# Patient Record
Sex: Female | Born: 1935 | Race: White | Hispanic: No
Health system: Southern US, Community
[De-identification: ages and names within clinical notes are randomized; demographics above are authoritative.]

## PROBLEM LIST (undated history)

## (undated) HISTORY — PX: CHOLECYSTECTOMY: SHX55

---

## 2014-05-07 DIAGNOSIS — H26492 Other secondary cataract, left eye: Secondary | ICD-10-CM | POA: Diagnosis not present

## 2014-05-16 DIAGNOSIS — R928 Other abnormal and inconclusive findings on diagnostic imaging of breast: Secondary | ICD-10-CM | POA: Diagnosis not present

## 2014-05-16 DIAGNOSIS — M19012 Primary osteoarthritis, left shoulder: Secondary | ICD-10-CM | POA: Diagnosis not present

## 2014-05-16 DIAGNOSIS — M25512 Pain in left shoulder: Secondary | ICD-10-CM | POA: Diagnosis not present

## 2014-05-21 DIAGNOSIS — H04123 Dry eye syndrome of bilateral lacrimal glands: Secondary | ICD-10-CM | POA: Diagnosis not present

## 2014-05-22 DIAGNOSIS — N6012 Diffuse cystic mastopathy of left breast: Secondary | ICD-10-CM | POA: Diagnosis not present

## 2014-05-22 DIAGNOSIS — R928 Other abnormal and inconclusive findings on diagnostic imaging of breast: Secondary | ICD-10-CM | POA: Diagnosis not present

## 2014-05-22 DIAGNOSIS — N642 Atrophy of breast: Secondary | ICD-10-CM | POA: Diagnosis not present

## 2014-06-20 DIAGNOSIS — M05741 Rheumatoid arthritis with rheumatoid factor of right hand without organ or systems involvement: Secondary | ICD-10-CM | POA: Diagnosis not present

## 2014-07-01 DIAGNOSIS — M05741 Rheumatoid arthritis with rheumatoid factor of right hand without organ or systems involvement: Secondary | ICD-10-CM | POA: Diagnosis not present

## 2014-07-01 DIAGNOSIS — M25512 Pain in left shoulder: Secondary | ICD-10-CM | POA: Diagnosis not present

## 2014-07-01 DIAGNOSIS — I10 Essential (primary) hypertension: Secondary | ICD-10-CM | POA: Diagnosis not present

## 2014-07-02 DIAGNOSIS — I471 Supraventricular tachycardia: Secondary | ICD-10-CM | POA: Diagnosis not present

## 2014-07-16 DIAGNOSIS — M810 Age-related osteoporosis without current pathological fracture: Secondary | ICD-10-CM | POA: Diagnosis not present

## 2014-07-16 DIAGNOSIS — M170 Bilateral primary osteoarthritis of knee: Secondary | ICD-10-CM | POA: Diagnosis not present

## 2014-07-16 DIAGNOSIS — M0589 Other rheumatoid arthritis with rheumatoid factor of multiple sites: Secondary | ICD-10-CM | POA: Diagnosis not present

## 2014-07-24 DIAGNOSIS — Z96651 Presence of right artificial knee joint: Secondary | ICD-10-CM | POA: Diagnosis not present

## 2014-08-05 DIAGNOSIS — M05741 Rheumatoid arthritis with rheumatoid factor of right hand without organ or systems involvement: Secondary | ICD-10-CM | POA: Diagnosis not present

## 2014-08-05 DIAGNOSIS — Z6821 Body mass index (BMI) 21.0-21.9, adult: Secondary | ICD-10-CM | POA: Diagnosis not present

## 2014-08-05 DIAGNOSIS — T889XXA Complication of surgical and medical care, unspecified, initial encounter: Secondary | ICD-10-CM | POA: Diagnosis not present

## 2014-08-13 DIAGNOSIS — R21 Rash and other nonspecific skin eruption: Secondary | ICD-10-CM | POA: Diagnosis not present

## 2014-08-13 DIAGNOSIS — Z6821 Body mass index (BMI) 21.0-21.9, adult: Secondary | ICD-10-CM | POA: Diagnosis not present

## 2014-08-22 DIAGNOSIS — E785 Hyperlipidemia, unspecified: Secondary | ICD-10-CM | POA: Diagnosis not present

## 2014-08-22 DIAGNOSIS — I471 Supraventricular tachycardia: Secondary | ICD-10-CM | POA: Diagnosis not present

## 2014-08-22 DIAGNOSIS — I10 Essential (primary) hypertension: Secondary | ICD-10-CM | POA: Diagnosis not present

## 2014-08-22 DIAGNOSIS — E559 Vitamin D deficiency, unspecified: Secondary | ICD-10-CM | POA: Diagnosis not present

## 2014-08-22 DIAGNOSIS — M05741 Rheumatoid arthritis with rheumatoid factor of right hand without organ or systems involvement: Secondary | ICD-10-CM | POA: Diagnosis not present

## 2014-10-07 DIAGNOSIS — M205X2 Other deformities of toe(s) (acquired), left foot: Secondary | ICD-10-CM | POA: Diagnosis not present

## 2014-10-07 DIAGNOSIS — M2041 Other hammer toe(s) (acquired), right foot: Secondary | ICD-10-CM | POA: Diagnosis not present

## 2014-10-07 DIAGNOSIS — M2011 Hallux valgus (acquired), right foot: Secondary | ICD-10-CM | POA: Diagnosis not present

## 2014-10-07 DIAGNOSIS — M2012 Hallux valgus (acquired), left foot: Secondary | ICD-10-CM | POA: Diagnosis not present

## 2014-10-17 DIAGNOSIS — M05741 Rheumatoid arthritis with rheumatoid factor of right hand without organ or systems involvement: Secondary | ICD-10-CM | POA: Diagnosis not present

## 2014-10-17 DIAGNOSIS — E785 Hyperlipidemia, unspecified: Secondary | ICD-10-CM | POA: Diagnosis not present

## 2014-10-22 DIAGNOSIS — M810 Age-related osteoporosis without current pathological fracture: Secondary | ICD-10-CM | POA: Diagnosis not present

## 2014-10-22 DIAGNOSIS — M170 Bilateral primary osteoarthritis of knee: Secondary | ICD-10-CM | POA: Diagnosis not present

## 2014-10-22 DIAGNOSIS — M0589 Other rheumatoid arthritis with rheumatoid factor of multiple sites: Secondary | ICD-10-CM | POA: Diagnosis not present

## 2014-11-25 DIAGNOSIS — J690 Pneumonitis due to inhalation of food and vomit: Secondary | ICD-10-CM | POA: Diagnosis not present

## 2014-11-25 DIAGNOSIS — E871 Hypo-osmolality and hyponatremia: Secondary | ICD-10-CM | POA: Diagnosis not present

## 2014-11-25 DIAGNOSIS — R509 Fever, unspecified: Secondary | ICD-10-CM | POA: Diagnosis not present

## 2014-11-25 DIAGNOSIS — M1990 Unspecified osteoarthritis, unspecified site: Secondary | ICD-10-CM | POA: Diagnosis not present

## 2014-11-25 DIAGNOSIS — R531 Weakness: Secondary | ICD-10-CM | POA: Diagnosis not present

## 2014-11-25 DIAGNOSIS — R52 Pain, unspecified: Secondary | ICD-10-CM | POA: Diagnosis not present

## 2014-11-25 DIAGNOSIS — I480 Paroxysmal atrial fibrillation: Secondary | ICD-10-CM | POA: Diagnosis not present

## 2014-11-25 DIAGNOSIS — M0609 Rheumatoid arthritis without rheumatoid factor, multiple sites: Secondary | ICD-10-CM | POA: Diagnosis not present

## 2014-11-25 DIAGNOSIS — B9629 Other Escherichia coli [E. coli] as the cause of diseases classified elsewhere: Secondary | ICD-10-CM | POA: Diagnosis not present

## 2014-11-25 DIAGNOSIS — R296 Repeated falls: Secondary | ICD-10-CM | POA: Diagnosis not present

## 2014-11-25 DIAGNOSIS — E860 Dehydration: Secondary | ICD-10-CM | POA: Diagnosis not present

## 2014-11-25 DIAGNOSIS — Z9981 Dependence on supplemental oxygen: Secondary | ICD-10-CM | POA: Diagnosis not present

## 2014-11-25 DIAGNOSIS — R0902 Hypoxemia: Secondary | ICD-10-CM | POA: Diagnosis not present

## 2014-11-25 DIAGNOSIS — I081 Rheumatic disorders of both mitral and tricuspid valves: Secondary | ICD-10-CM | POA: Diagnosis not present

## 2014-11-25 DIAGNOSIS — F0390 Unspecified dementia without behavioral disturbance: Secondary | ICD-10-CM | POA: Diagnosis not present

## 2014-11-25 DIAGNOSIS — D509 Iron deficiency anemia, unspecified: Secondary | ICD-10-CM | POA: Diagnosis not present

## 2014-11-25 DIAGNOSIS — I10 Essential (primary) hypertension: Secondary | ICD-10-CM | POA: Diagnosis not present

## 2014-11-25 DIAGNOSIS — W19XXXA Unspecified fall, initial encounter: Secondary | ICD-10-CM | POA: Diagnosis not present

## 2014-11-25 DIAGNOSIS — Z888 Allergy status to other drugs, medicaments and biological substances status: Secondary | ICD-10-CM | POA: Diagnosis not present

## 2014-11-25 DIAGNOSIS — I4891 Unspecified atrial fibrillation: Secondary | ICD-10-CM | POA: Diagnosis not present

## 2014-11-25 DIAGNOSIS — S79922A Unspecified injury of left thigh, initial encounter: Secondary | ICD-10-CM | POA: Diagnosis not present

## 2014-11-25 DIAGNOSIS — N3001 Acute cystitis with hematuria: Secondary | ICD-10-CM | POA: Diagnosis not present

## 2014-11-25 DIAGNOSIS — M069 Rheumatoid arthritis, unspecified: Secondary | ICD-10-CM | POA: Diagnosis not present

## 2014-11-25 DIAGNOSIS — G92 Toxic encephalopathy: Secondary | ICD-10-CM | POA: Diagnosis not present

## 2014-11-25 DIAGNOSIS — Z79899 Other long term (current) drug therapy: Secondary | ICD-10-CM | POA: Diagnosis not present

## 2014-11-25 DIAGNOSIS — N179 Acute kidney failure, unspecified: Secondary | ICD-10-CM | POA: Diagnosis not present

## 2014-11-25 DIAGNOSIS — Z885 Allergy status to narcotic agent status: Secondary | ICD-10-CM | POA: Diagnosis not present

## 2014-11-25 DIAGNOSIS — N390 Urinary tract infection, site not specified: Secondary | ICD-10-CM | POA: Diagnosis not present

## 2014-11-25 DIAGNOSIS — R5383 Other fatigue: Secondary | ICD-10-CM | POA: Diagnosis not present

## 2014-11-25 DIAGNOSIS — J156 Pneumonia due to other aerobic Gram-negative bacteria: Secondary | ICD-10-CM | POA: Diagnosis not present

## 2014-11-25 DIAGNOSIS — A419 Sepsis, unspecified organism: Secondary | ICD-10-CM | POA: Diagnosis not present

## 2014-11-25 DIAGNOSIS — K219 Gastro-esophageal reflux disease without esophagitis: Secondary | ICD-10-CM | POA: Diagnosis not present

## 2014-11-25 DIAGNOSIS — G02 Meningitis in other infectious and parasitic diseases classified elsewhere: Secondary | ICD-10-CM | POA: Diagnosis not present

## 2014-11-25 DIAGNOSIS — Z7401 Bed confinement status: Secondary | ICD-10-CM | POA: Diagnosis not present

## 2014-11-25 DIAGNOSIS — J9601 Acute respiratory failure with hypoxia: Secondary | ICD-10-CM | POA: Diagnosis not present

## 2014-11-25 DIAGNOSIS — W19XXXD Unspecified fall, subsequent encounter: Secondary | ICD-10-CM | POA: Diagnosis not present

## 2014-11-25 DIAGNOSIS — R000 Tachycardia, unspecified: Secondary | ICD-10-CM | POA: Diagnosis not present

## 2014-12-02 DIAGNOSIS — I480 Paroxysmal atrial fibrillation: Secondary | ICD-10-CM | POA: Diagnosis not present

## 2014-12-02 DIAGNOSIS — N390 Urinary tract infection, site not specified: Secondary | ICD-10-CM | POA: Diagnosis not present

## 2014-12-02 DIAGNOSIS — E860 Dehydration: Secondary | ICD-10-CM | POA: Diagnosis not present

## 2014-12-02 DIAGNOSIS — W19XXXD Unspecified fall, subsequent encounter: Secondary | ICD-10-CM | POA: Diagnosis not present

## 2014-12-02 DIAGNOSIS — W19XXXA Unspecified fall, initial encounter: Secondary | ICD-10-CM | POA: Diagnosis not present

## 2014-12-02 DIAGNOSIS — Z7401 Bed confinement status: Secondary | ICD-10-CM | POA: Diagnosis not present

## 2014-12-02 DIAGNOSIS — F0390 Unspecified dementia without behavioral disturbance: Secondary | ICD-10-CM | POA: Diagnosis not present

## 2014-12-02 DIAGNOSIS — N3001 Acute cystitis with hematuria: Secondary | ICD-10-CM | POA: Diagnosis not present

## 2014-12-02 DIAGNOSIS — B9629 Other Escherichia coli [E. coli] as the cause of diseases classified elsewhere: Secondary | ICD-10-CM | POA: Diagnosis not present

## 2014-12-02 DIAGNOSIS — Z9981 Dependence on supplemental oxygen: Secondary | ICD-10-CM | POA: Diagnosis not present

## 2014-12-03 DIAGNOSIS — I480 Paroxysmal atrial fibrillation: Secondary | ICD-10-CM | POA: Diagnosis not present

## 2014-12-10 DIAGNOSIS — I480 Paroxysmal atrial fibrillation: Secondary | ICD-10-CM | POA: Diagnosis not present

## 2014-12-15 DIAGNOSIS — Z9181 History of falling: Secondary | ICD-10-CM | POA: Diagnosis not present

## 2014-12-15 DIAGNOSIS — I4891 Unspecified atrial fibrillation: Secondary | ICD-10-CM | POA: Diagnosis not present

## 2014-12-15 DIAGNOSIS — I10 Essential (primary) hypertension: Secondary | ICD-10-CM | POA: Diagnosis not present

## 2014-12-15 DIAGNOSIS — R262 Difficulty in walking, not elsewhere classified: Secondary | ICD-10-CM | POA: Diagnosis not present

## 2014-12-15 DIAGNOSIS — F0390 Unspecified dementia without behavioral disturbance: Secondary | ICD-10-CM | POA: Diagnosis not present

## 2014-12-15 DIAGNOSIS — M6281 Muscle weakness (generalized): Secondary | ICD-10-CM | POA: Diagnosis not present

## 2014-12-16 DIAGNOSIS — R262 Difficulty in walking, not elsewhere classified: Secondary | ICD-10-CM | POA: Diagnosis not present

## 2014-12-16 DIAGNOSIS — I10 Essential (primary) hypertension: Secondary | ICD-10-CM | POA: Diagnosis not present

## 2014-12-16 DIAGNOSIS — M6281 Muscle weakness (generalized): Secondary | ICD-10-CM | POA: Diagnosis not present

## 2014-12-16 DIAGNOSIS — F0390 Unspecified dementia without behavioral disturbance: Secondary | ICD-10-CM | POA: Diagnosis not present

## 2014-12-16 DIAGNOSIS — I4891 Unspecified atrial fibrillation: Secondary | ICD-10-CM | POA: Diagnosis not present

## 2014-12-16 DIAGNOSIS — Z9181 History of falling: Secondary | ICD-10-CM | POA: Diagnosis not present

## 2014-12-17 DIAGNOSIS — R262 Difficulty in walking, not elsewhere classified: Secondary | ICD-10-CM | POA: Diagnosis not present

## 2014-12-17 DIAGNOSIS — I4891 Unspecified atrial fibrillation: Secondary | ICD-10-CM | POA: Diagnosis not present

## 2014-12-17 DIAGNOSIS — M6281 Muscle weakness (generalized): Secondary | ICD-10-CM | POA: Diagnosis not present

## 2014-12-17 DIAGNOSIS — Z9181 History of falling: Secondary | ICD-10-CM | POA: Diagnosis not present

## 2014-12-17 DIAGNOSIS — F0390 Unspecified dementia without behavioral disturbance: Secondary | ICD-10-CM | POA: Diagnosis not present

## 2014-12-17 DIAGNOSIS — I10 Essential (primary) hypertension: Secondary | ICD-10-CM | POA: Diagnosis not present

## 2014-12-19 DIAGNOSIS — I10 Essential (primary) hypertension: Secondary | ICD-10-CM | POA: Diagnosis not present

## 2014-12-19 DIAGNOSIS — I4891 Unspecified atrial fibrillation: Secondary | ICD-10-CM | POA: Diagnosis not present

## 2014-12-19 DIAGNOSIS — R262 Difficulty in walking, not elsewhere classified: Secondary | ICD-10-CM | POA: Diagnosis not present

## 2014-12-19 DIAGNOSIS — F0390 Unspecified dementia without behavioral disturbance: Secondary | ICD-10-CM | POA: Diagnosis not present

## 2014-12-19 DIAGNOSIS — M6281 Muscle weakness (generalized): Secondary | ICD-10-CM | POA: Diagnosis not present

## 2014-12-19 DIAGNOSIS — Z9181 History of falling: Secondary | ICD-10-CM | POA: Diagnosis not present

## 2014-12-23 DIAGNOSIS — F0390 Unspecified dementia without behavioral disturbance: Secondary | ICD-10-CM | POA: Diagnosis not present

## 2014-12-23 DIAGNOSIS — M6281 Muscle weakness (generalized): Secondary | ICD-10-CM | POA: Diagnosis not present

## 2014-12-23 DIAGNOSIS — I10 Essential (primary) hypertension: Secondary | ICD-10-CM | POA: Diagnosis not present

## 2014-12-23 DIAGNOSIS — I4891 Unspecified atrial fibrillation: Secondary | ICD-10-CM | POA: Diagnosis not present

## 2014-12-23 DIAGNOSIS — Z9181 History of falling: Secondary | ICD-10-CM | POA: Diagnosis not present

## 2014-12-23 DIAGNOSIS — R262 Difficulty in walking, not elsewhere classified: Secondary | ICD-10-CM | POA: Diagnosis not present

## 2014-12-25 DIAGNOSIS — Z9181 History of falling: Secondary | ICD-10-CM | POA: Diagnosis not present

## 2014-12-25 DIAGNOSIS — R262 Difficulty in walking, not elsewhere classified: Secondary | ICD-10-CM | POA: Diagnosis not present

## 2014-12-25 DIAGNOSIS — F0390 Unspecified dementia without behavioral disturbance: Secondary | ICD-10-CM | POA: Diagnosis not present

## 2014-12-25 DIAGNOSIS — I4891 Unspecified atrial fibrillation: Secondary | ICD-10-CM | POA: Diagnosis not present

## 2014-12-25 DIAGNOSIS — M6281 Muscle weakness (generalized): Secondary | ICD-10-CM | POA: Diagnosis not present

## 2014-12-25 DIAGNOSIS — I10 Essential (primary) hypertension: Secondary | ICD-10-CM | POA: Diagnosis not present

## 2014-12-26 DIAGNOSIS — I480 Paroxysmal atrial fibrillation: Secondary | ICD-10-CM | POA: Diagnosis not present

## 2014-12-26 DIAGNOSIS — Z9181 History of falling: Secondary | ICD-10-CM | POA: Diagnosis not present

## 2014-12-26 DIAGNOSIS — Z1389 Encounter for screening for other disorder: Secondary | ICD-10-CM | POA: Diagnosis not present

## 2014-12-26 DIAGNOSIS — M05741 Rheumatoid arthritis with rheumatoid factor of right hand without organ or systems involvement: Secondary | ICD-10-CM | POA: Diagnosis not present

## 2014-12-26 DIAGNOSIS — G92 Toxic encephalopathy: Secondary | ICD-10-CM | POA: Diagnosis not present

## 2014-12-26 DIAGNOSIS — Z139 Encounter for screening, unspecified: Secondary | ICD-10-CM | POA: Diagnosis not present

## 2014-12-26 DIAGNOSIS — M6281 Muscle weakness (generalized): Secondary | ICD-10-CM | POA: Diagnosis not present

## 2014-12-26 DIAGNOSIS — I10 Essential (primary) hypertension: Secondary | ICD-10-CM | POA: Diagnosis not present

## 2014-12-26 DIAGNOSIS — R262 Difficulty in walking, not elsewhere classified: Secondary | ICD-10-CM | POA: Diagnosis not present

## 2014-12-26 DIAGNOSIS — F0390 Unspecified dementia without behavioral disturbance: Secondary | ICD-10-CM | POA: Diagnosis not present

## 2014-12-26 DIAGNOSIS — N390 Urinary tract infection, site not specified: Secondary | ICD-10-CM | POA: Diagnosis not present

## 2014-12-26 DIAGNOSIS — I4891 Unspecified atrial fibrillation: Secondary | ICD-10-CM | POA: Diagnosis not present

## 2014-12-27 DIAGNOSIS — Z681 Body mass index (BMI) 19 or less, adult: Secondary | ICD-10-CM | POA: Diagnosis not present

## 2014-12-27 DIAGNOSIS — M05741 Rheumatoid arthritis with rheumatoid factor of right hand without organ or systems involvement: Secondary | ICD-10-CM | POA: Diagnosis not present

## 2014-12-27 DIAGNOSIS — M659 Synovitis and tenosynovitis, unspecified: Secondary | ICD-10-CM | POA: Diagnosis not present

## 2014-12-30 DIAGNOSIS — I10 Essential (primary) hypertension: Secondary | ICD-10-CM | POA: Diagnosis not present

## 2014-12-30 DIAGNOSIS — M05741 Rheumatoid arthritis with rheumatoid factor of right hand without organ or systems involvement: Secondary | ICD-10-CM | POA: Diagnosis not present

## 2015-01-01 DIAGNOSIS — I4891 Unspecified atrial fibrillation: Secondary | ICD-10-CM | POA: Diagnosis not present

## 2015-01-01 DIAGNOSIS — R262 Difficulty in walking, not elsewhere classified: Secondary | ICD-10-CM | POA: Diagnosis not present

## 2015-01-01 DIAGNOSIS — M6281 Muscle weakness (generalized): Secondary | ICD-10-CM | POA: Diagnosis not present

## 2015-01-01 DIAGNOSIS — Z9181 History of falling: Secondary | ICD-10-CM | POA: Diagnosis not present

## 2015-01-01 DIAGNOSIS — I10 Essential (primary) hypertension: Secondary | ICD-10-CM | POA: Diagnosis not present

## 2015-01-01 DIAGNOSIS — F0390 Unspecified dementia without behavioral disturbance: Secondary | ICD-10-CM | POA: Diagnosis not present

## 2015-01-02 DIAGNOSIS — I480 Paroxysmal atrial fibrillation: Secondary | ICD-10-CM | POA: Diagnosis not present

## 2015-01-02 DIAGNOSIS — I10 Essential (primary) hypertension: Secondary | ICD-10-CM | POA: Diagnosis not present

## 2015-01-02 DIAGNOSIS — F0390 Unspecified dementia without behavioral disturbance: Secondary | ICD-10-CM | POA: Diagnosis not present

## 2015-01-02 DIAGNOSIS — I4891 Unspecified atrial fibrillation: Secondary | ICD-10-CM | POA: Diagnosis not present

## 2015-01-02 DIAGNOSIS — M6281 Muscle weakness (generalized): Secondary | ICD-10-CM | POA: Diagnosis not present

## 2015-01-02 DIAGNOSIS — Z9181 History of falling: Secondary | ICD-10-CM | POA: Diagnosis not present

## 2015-01-02 DIAGNOSIS — R262 Difficulty in walking, not elsewhere classified: Secondary | ICD-10-CM | POA: Diagnosis not present

## 2015-01-08 DIAGNOSIS — F0390 Unspecified dementia without behavioral disturbance: Secondary | ICD-10-CM | POA: Diagnosis not present

## 2015-01-08 DIAGNOSIS — Z9181 History of falling: Secondary | ICD-10-CM | POA: Diagnosis not present

## 2015-01-08 DIAGNOSIS — I10 Essential (primary) hypertension: Secondary | ICD-10-CM | POA: Diagnosis not present

## 2015-01-08 DIAGNOSIS — M6281 Muscle weakness (generalized): Secondary | ICD-10-CM | POA: Diagnosis not present

## 2015-01-08 DIAGNOSIS — R262 Difficulty in walking, not elsewhere classified: Secondary | ICD-10-CM | POA: Diagnosis not present

## 2015-01-08 DIAGNOSIS — I4891 Unspecified atrial fibrillation: Secondary | ICD-10-CM | POA: Diagnosis not present

## 2015-01-09 DIAGNOSIS — M6281 Muscle weakness (generalized): Secondary | ICD-10-CM | POA: Diagnosis not present

## 2015-01-09 DIAGNOSIS — Z9181 History of falling: Secondary | ICD-10-CM | POA: Diagnosis not present

## 2015-01-09 DIAGNOSIS — I10 Essential (primary) hypertension: Secondary | ICD-10-CM | POA: Diagnosis not present

## 2015-01-09 DIAGNOSIS — I4891 Unspecified atrial fibrillation: Secondary | ICD-10-CM | POA: Diagnosis not present

## 2015-01-09 DIAGNOSIS — R262 Difficulty in walking, not elsewhere classified: Secondary | ICD-10-CM | POA: Diagnosis not present

## 2015-01-09 DIAGNOSIS — F0390 Unspecified dementia without behavioral disturbance: Secondary | ICD-10-CM | POA: Diagnosis not present

## 2015-01-27 DIAGNOSIS — Z96651 Presence of right artificial knee joint: Secondary | ICD-10-CM | POA: Diagnosis not present

## 2015-01-28 DIAGNOSIS — M810 Age-related osteoporosis without current pathological fracture: Secondary | ICD-10-CM | POA: Diagnosis not present

## 2015-01-28 DIAGNOSIS — M0589 Other rheumatoid arthritis with rheumatoid factor of multiple sites: Secondary | ICD-10-CM | POA: Diagnosis not present

## 2015-01-28 DIAGNOSIS — M170 Bilateral primary osteoarthritis of knee: Secondary | ICD-10-CM | POA: Diagnosis not present

## 2015-01-30 DIAGNOSIS — N390 Urinary tract infection, site not specified: Secondary | ICD-10-CM | POA: Diagnosis not present

## 2015-02-13 DIAGNOSIS — I482 Chronic atrial fibrillation: Secondary | ICD-10-CM | POA: Diagnosis not present

## 2015-02-13 DIAGNOSIS — I4891 Unspecified atrial fibrillation: Secondary | ICD-10-CM | POA: Diagnosis not present

## 2015-02-26 DIAGNOSIS — M05741 Rheumatoid arthritis with rheumatoid factor of right hand without organ or systems involvement: Secondary | ICD-10-CM | POA: Diagnosis not present

## 2015-02-26 DIAGNOSIS — E785 Hyperlipidemia, unspecified: Secondary | ICD-10-CM | POA: Diagnosis not present

## 2015-02-26 DIAGNOSIS — I10 Essential (primary) hypertension: Secondary | ICD-10-CM | POA: Diagnosis not present

## 2015-02-26 DIAGNOSIS — Z23 Encounter for immunization: Secondary | ICD-10-CM | POA: Diagnosis not present

## 2015-02-26 DIAGNOSIS — E559 Vitamin D deficiency, unspecified: Secondary | ICD-10-CM | POA: Diagnosis not present

## 2015-02-26 DIAGNOSIS — I480 Paroxysmal atrial fibrillation: Secondary | ICD-10-CM | POA: Diagnosis not present

## 2015-03-11 DIAGNOSIS — M05741 Rheumatoid arthritis with rheumatoid factor of right hand without organ or systems involvement: Secondary | ICD-10-CM | POA: Diagnosis not present

## 2015-03-11 DIAGNOSIS — M25552 Pain in left hip: Secondary | ICD-10-CM | POA: Diagnosis not present

## 2015-03-11 DIAGNOSIS — I10 Essential (primary) hypertension: Secondary | ICD-10-CM | POA: Diagnosis not present

## 2015-04-15 DIAGNOSIS — Z7952 Long term (current) use of systemic steroids: Secondary | ICD-10-CM | POA: Diagnosis not present

## 2015-04-15 DIAGNOSIS — I480 Paroxysmal atrial fibrillation: Secondary | ICD-10-CM | POA: Diagnosis not present

## 2015-04-15 DIAGNOSIS — F0390 Unspecified dementia without behavioral disturbance: Secondary | ICD-10-CM | POA: Diagnosis not present

## 2015-04-15 DIAGNOSIS — G8918 Other acute postprocedural pain: Secondary | ICD-10-CM | POA: Diagnosis not present

## 2015-04-15 DIAGNOSIS — Z87440 Personal history of urinary (tract) infections: Secondary | ICD-10-CM | POA: Diagnosis not present

## 2015-04-15 DIAGNOSIS — K219 Gastro-esophageal reflux disease without esophagitis: Secondary | ICD-10-CM | POA: Diagnosis not present

## 2015-04-15 DIAGNOSIS — I10 Essential (primary) hypertension: Secondary | ICD-10-CM | POA: Diagnosis not present

## 2015-04-15 DIAGNOSIS — S72141D Displaced intertrochanteric fracture of right femur, subsequent encounter for closed fracture with routine healing: Secondary | ICD-10-CM | POA: Diagnosis not present

## 2015-04-15 DIAGNOSIS — Z885 Allergy status to narcotic agent status: Secondary | ICD-10-CM | POA: Diagnosis not present

## 2015-04-15 DIAGNOSIS — N179 Acute kidney failure, unspecified: Secondary | ICD-10-CM | POA: Diagnosis not present

## 2015-04-15 DIAGNOSIS — M1990 Unspecified osteoarthritis, unspecified site: Secondary | ICD-10-CM | POA: Diagnosis not present

## 2015-04-15 DIAGNOSIS — N1330 Unspecified hydronephrosis: Secondary | ICD-10-CM | POA: Diagnosis not present

## 2015-04-15 DIAGNOSIS — S299XXA Unspecified injury of thorax, initial encounter: Secondary | ICD-10-CM | POA: Diagnosis not present

## 2015-04-15 DIAGNOSIS — S72141A Displaced intertrochanteric fracture of right femur, initial encounter for closed fracture: Secondary | ICD-10-CM | POA: Diagnosis not present

## 2015-04-15 DIAGNOSIS — M25551 Pain in right hip: Secondary | ICD-10-CM | POA: Diagnosis not present

## 2015-04-15 DIAGNOSIS — D62 Acute posthemorrhagic anemia: Secondary | ICD-10-CM | POA: Diagnosis not present

## 2015-04-15 DIAGNOSIS — N170 Acute kidney failure with tubular necrosis: Secondary | ICD-10-CM | POA: Diagnosis not present

## 2015-04-15 DIAGNOSIS — D509 Iron deficiency anemia, unspecified: Secondary | ICD-10-CM | POA: Diagnosis not present

## 2015-04-15 DIAGNOSIS — D649 Anemia, unspecified: Secondary | ICD-10-CM | POA: Diagnosis not present

## 2015-04-15 DIAGNOSIS — T148 Other injury of unspecified body region: Secondary | ICD-10-CM | POA: Diagnosis not present

## 2015-04-15 DIAGNOSIS — F419 Anxiety disorder, unspecified: Secondary | ICD-10-CM | POA: Diagnosis not present

## 2015-04-15 DIAGNOSIS — R531 Weakness: Secondary | ICD-10-CM | POA: Diagnosis not present

## 2015-04-15 DIAGNOSIS — Z7401 Bed confinement status: Secondary | ICD-10-CM | POA: Diagnosis not present

## 2015-04-15 DIAGNOSIS — K5790 Diverticulosis of intestine, part unspecified, without perforation or abscess without bleeding: Secondary | ICD-10-CM | POA: Diagnosis not present

## 2015-04-15 DIAGNOSIS — Z888 Allergy status to other drugs, medicaments and biological substances status: Secondary | ICD-10-CM | POA: Diagnosis not present

## 2015-04-15 DIAGNOSIS — M810 Age-related osteoporosis without current pathological fracture: Secondary | ICD-10-CM | POA: Diagnosis not present

## 2015-04-15 DIAGNOSIS — Z791 Long term (current) use of non-steroidal anti-inflammatories (NSAID): Secondary | ICD-10-CM | POA: Diagnosis not present

## 2015-04-15 DIAGNOSIS — D6489 Other specified anemias: Secondary | ICD-10-CM | POA: Diagnosis not present

## 2015-04-15 DIAGNOSIS — M069 Rheumatoid arthritis, unspecified: Secondary | ICD-10-CM | POA: Diagnosis not present

## 2015-04-15 DIAGNOSIS — Z96651 Presence of right artificial knee joint: Secondary | ICD-10-CM | POA: Diagnosis not present

## 2015-04-19 DIAGNOSIS — S72141A Displaced intertrochanteric fracture of right femur, initial encounter for closed fracture: Secondary | ICD-10-CM | POA: Diagnosis not present

## 2015-04-19 DIAGNOSIS — Z7401 Bed confinement status: Secondary | ICD-10-CM | POA: Diagnosis not present

## 2015-04-19 DIAGNOSIS — R531 Weakness: Secondary | ICD-10-CM | POA: Diagnosis not present

## 2015-04-19 DIAGNOSIS — D6489 Other specified anemias: Secondary | ICD-10-CM | POA: Diagnosis not present

## 2015-04-19 DIAGNOSIS — S72141D Displaced intertrochanteric fracture of right femur, subsequent encounter for closed fracture with routine healing: Secondary | ICD-10-CM | POA: Diagnosis not present

## 2015-04-19 DIAGNOSIS — M069 Rheumatoid arthritis, unspecified: Secondary | ICD-10-CM | POA: Diagnosis not present

## 2015-04-19 DIAGNOSIS — I480 Paroxysmal atrial fibrillation: Secondary | ICD-10-CM | POA: Diagnosis not present

## 2015-04-19 DIAGNOSIS — F419 Anxiety disorder, unspecified: Secondary | ICD-10-CM | POA: Diagnosis not present

## 2015-04-19 DIAGNOSIS — G8918 Other acute postprocedural pain: Secondary | ICD-10-CM | POA: Diagnosis not present

## 2015-04-30 DIAGNOSIS — S72141A Displaced intertrochanteric fracture of right femur, initial encounter for closed fracture: Secondary | ICD-10-CM | POA: Diagnosis not present

## 2015-05-09 DIAGNOSIS — I480 Paroxysmal atrial fibrillation: Secondary | ICD-10-CM | POA: Diagnosis not present

## 2015-05-21 DIAGNOSIS — I480 Paroxysmal atrial fibrillation: Secondary | ICD-10-CM | POA: Diagnosis not present

## 2015-05-21 DIAGNOSIS — Z7952 Long term (current) use of systemic steroids: Secondary | ICD-10-CM | POA: Diagnosis not present

## 2015-05-21 DIAGNOSIS — I10 Essential (primary) hypertension: Secondary | ICD-10-CM | POA: Diagnosis not present

## 2015-05-21 DIAGNOSIS — Z96651 Presence of right artificial knee joint: Secondary | ICD-10-CM | POA: Diagnosis not present

## 2015-05-21 DIAGNOSIS — D649 Anemia, unspecified: Secondary | ICD-10-CM | POA: Diagnosis not present

## 2015-05-21 DIAGNOSIS — Z7982 Long term (current) use of aspirin: Secondary | ICD-10-CM | POA: Diagnosis not present

## 2015-05-21 DIAGNOSIS — S72141D Displaced intertrochanteric fracture of right femur, subsequent encounter for closed fracture with routine healing: Secondary | ICD-10-CM | POA: Diagnosis not present

## 2015-05-21 DIAGNOSIS — M1990 Unspecified osteoarthritis, unspecified site: Secondary | ICD-10-CM | POA: Diagnosis not present

## 2015-05-21 DIAGNOSIS — Z79891 Long term (current) use of opiate analgesic: Secondary | ICD-10-CM | POA: Diagnosis not present

## 2015-05-21 DIAGNOSIS — Z602 Problems related to living alone: Secondary | ICD-10-CM | POA: Diagnosis not present

## 2015-05-21 DIAGNOSIS — M069 Rheumatoid arthritis, unspecified: Secondary | ICD-10-CM | POA: Diagnosis not present

## 2015-05-22 DIAGNOSIS — Z79891 Long term (current) use of opiate analgesic: Secondary | ICD-10-CM | POA: Diagnosis not present

## 2015-05-22 DIAGNOSIS — Z602 Problems related to living alone: Secondary | ICD-10-CM | POA: Diagnosis not present

## 2015-05-22 DIAGNOSIS — I480 Paroxysmal atrial fibrillation: Secondary | ICD-10-CM | POA: Diagnosis not present

## 2015-05-22 DIAGNOSIS — M1990 Unspecified osteoarthritis, unspecified site: Secondary | ICD-10-CM | POA: Diagnosis not present

## 2015-05-22 DIAGNOSIS — I10 Essential (primary) hypertension: Secondary | ICD-10-CM | POA: Diagnosis not present

## 2015-05-22 DIAGNOSIS — S72141D Displaced intertrochanteric fracture of right femur, subsequent encounter for closed fracture with routine healing: Secondary | ICD-10-CM | POA: Diagnosis not present

## 2015-05-22 DIAGNOSIS — M069 Rheumatoid arthritis, unspecified: Secondary | ICD-10-CM | POA: Diagnosis not present

## 2015-05-22 DIAGNOSIS — D649 Anemia, unspecified: Secondary | ICD-10-CM | POA: Diagnosis not present

## 2015-05-22 DIAGNOSIS — Z7982 Long term (current) use of aspirin: Secondary | ICD-10-CM | POA: Diagnosis not present

## 2015-05-22 DIAGNOSIS — Z96651 Presence of right artificial knee joint: Secondary | ICD-10-CM | POA: Diagnosis not present

## 2015-05-22 DIAGNOSIS — Z7952 Long term (current) use of systemic steroids: Secondary | ICD-10-CM | POA: Diagnosis not present

## 2015-05-23 DIAGNOSIS — Z602 Problems related to living alone: Secondary | ICD-10-CM | POA: Diagnosis not present

## 2015-05-23 DIAGNOSIS — M069 Rheumatoid arthritis, unspecified: Secondary | ICD-10-CM | POA: Diagnosis not present

## 2015-05-23 DIAGNOSIS — I10 Essential (primary) hypertension: Secondary | ICD-10-CM | POA: Diagnosis not present

## 2015-05-23 DIAGNOSIS — S72141D Displaced intertrochanteric fracture of right femur, subsequent encounter for closed fracture with routine healing: Secondary | ICD-10-CM | POA: Diagnosis not present

## 2015-05-23 DIAGNOSIS — Z79891 Long term (current) use of opiate analgesic: Secondary | ICD-10-CM | POA: Diagnosis not present

## 2015-05-23 DIAGNOSIS — Z7982 Long term (current) use of aspirin: Secondary | ICD-10-CM | POA: Diagnosis not present

## 2015-05-23 DIAGNOSIS — D649 Anemia, unspecified: Secondary | ICD-10-CM | POA: Diagnosis not present

## 2015-05-23 DIAGNOSIS — I480 Paroxysmal atrial fibrillation: Secondary | ICD-10-CM | POA: Diagnosis not present

## 2015-05-23 DIAGNOSIS — M1990 Unspecified osteoarthritis, unspecified site: Secondary | ICD-10-CM | POA: Diagnosis not present

## 2015-05-23 DIAGNOSIS — Z96651 Presence of right artificial knee joint: Secondary | ICD-10-CM | POA: Diagnosis not present

## 2015-05-23 DIAGNOSIS — Z7952 Long term (current) use of systemic steroids: Secondary | ICD-10-CM | POA: Diagnosis not present

## 2015-05-25 DIAGNOSIS — Z7982 Long term (current) use of aspirin: Secondary | ICD-10-CM | POA: Diagnosis not present

## 2015-05-25 DIAGNOSIS — Z96651 Presence of right artificial knee joint: Secondary | ICD-10-CM | POA: Diagnosis not present

## 2015-05-25 DIAGNOSIS — Z602 Problems related to living alone: Secondary | ICD-10-CM | POA: Diagnosis not present

## 2015-05-25 DIAGNOSIS — Z7952 Long term (current) use of systemic steroids: Secondary | ICD-10-CM | POA: Diagnosis not present

## 2015-05-25 DIAGNOSIS — M069 Rheumatoid arthritis, unspecified: Secondary | ICD-10-CM | POA: Diagnosis not present

## 2015-05-25 DIAGNOSIS — D649 Anemia, unspecified: Secondary | ICD-10-CM | POA: Diagnosis not present

## 2015-05-25 DIAGNOSIS — Z79891 Long term (current) use of opiate analgesic: Secondary | ICD-10-CM | POA: Diagnosis not present

## 2015-05-25 DIAGNOSIS — I480 Paroxysmal atrial fibrillation: Secondary | ICD-10-CM | POA: Diagnosis not present

## 2015-05-25 DIAGNOSIS — M1990 Unspecified osteoarthritis, unspecified site: Secondary | ICD-10-CM | POA: Diagnosis not present

## 2015-05-25 DIAGNOSIS — I10 Essential (primary) hypertension: Secondary | ICD-10-CM | POA: Diagnosis not present

## 2015-05-25 DIAGNOSIS — S72141D Displaced intertrochanteric fracture of right femur, subsequent encounter for closed fracture with routine healing: Secondary | ICD-10-CM | POA: Diagnosis not present

## 2015-05-26 DIAGNOSIS — M1990 Unspecified osteoarthritis, unspecified site: Secondary | ICD-10-CM | POA: Diagnosis not present

## 2015-05-26 DIAGNOSIS — S72141D Displaced intertrochanteric fracture of right femur, subsequent encounter for closed fracture with routine healing: Secondary | ICD-10-CM | POA: Diagnosis not present

## 2015-05-26 DIAGNOSIS — Z7982 Long term (current) use of aspirin: Secondary | ICD-10-CM | POA: Diagnosis not present

## 2015-05-26 DIAGNOSIS — Z7952 Long term (current) use of systemic steroids: Secondary | ICD-10-CM | POA: Diagnosis not present

## 2015-05-26 DIAGNOSIS — M069 Rheumatoid arthritis, unspecified: Secondary | ICD-10-CM | POA: Diagnosis not present

## 2015-05-26 DIAGNOSIS — Z79891 Long term (current) use of opiate analgesic: Secondary | ICD-10-CM | POA: Diagnosis not present

## 2015-05-26 DIAGNOSIS — Z96651 Presence of right artificial knee joint: Secondary | ICD-10-CM | POA: Diagnosis not present

## 2015-05-26 DIAGNOSIS — D649 Anemia, unspecified: Secondary | ICD-10-CM | POA: Diagnosis not present

## 2015-05-26 DIAGNOSIS — I480 Paroxysmal atrial fibrillation: Secondary | ICD-10-CM | POA: Diagnosis not present

## 2015-05-26 DIAGNOSIS — I10 Essential (primary) hypertension: Secondary | ICD-10-CM | POA: Diagnosis not present

## 2015-05-26 DIAGNOSIS — Z602 Problems related to living alone: Secondary | ICD-10-CM | POA: Diagnosis not present

## 2015-05-27 DIAGNOSIS — Z7952 Long term (current) use of systemic steroids: Secondary | ICD-10-CM | POA: Diagnosis not present

## 2015-05-27 DIAGNOSIS — I10 Essential (primary) hypertension: Secondary | ICD-10-CM | POA: Diagnosis not present

## 2015-05-27 DIAGNOSIS — Z96651 Presence of right artificial knee joint: Secondary | ICD-10-CM | POA: Diagnosis not present

## 2015-05-27 DIAGNOSIS — Z602 Problems related to living alone: Secondary | ICD-10-CM | POA: Diagnosis not present

## 2015-05-27 DIAGNOSIS — M1990 Unspecified osteoarthritis, unspecified site: Secondary | ICD-10-CM | POA: Diagnosis not present

## 2015-05-27 DIAGNOSIS — I480 Paroxysmal atrial fibrillation: Secondary | ICD-10-CM | POA: Diagnosis not present

## 2015-05-27 DIAGNOSIS — D649 Anemia, unspecified: Secondary | ICD-10-CM | POA: Diagnosis not present

## 2015-05-27 DIAGNOSIS — Z7982 Long term (current) use of aspirin: Secondary | ICD-10-CM | POA: Diagnosis not present

## 2015-05-27 DIAGNOSIS — S72141D Displaced intertrochanteric fracture of right femur, subsequent encounter for closed fracture with routine healing: Secondary | ICD-10-CM | POA: Diagnosis not present

## 2015-05-27 DIAGNOSIS — Z79891 Long term (current) use of opiate analgesic: Secondary | ICD-10-CM | POA: Diagnosis not present

## 2015-05-27 DIAGNOSIS — M069 Rheumatoid arthritis, unspecified: Secondary | ICD-10-CM | POA: Diagnosis not present

## 2015-05-29 DIAGNOSIS — D649 Anemia, unspecified: Secondary | ICD-10-CM | POA: Diagnosis not present

## 2015-05-29 DIAGNOSIS — I10 Essential (primary) hypertension: Secondary | ICD-10-CM | POA: Diagnosis not present

## 2015-05-29 DIAGNOSIS — Z79891 Long term (current) use of opiate analgesic: Secondary | ICD-10-CM | POA: Diagnosis not present

## 2015-05-29 DIAGNOSIS — M069 Rheumatoid arthritis, unspecified: Secondary | ICD-10-CM | POA: Diagnosis not present

## 2015-05-29 DIAGNOSIS — M1990 Unspecified osteoarthritis, unspecified site: Secondary | ICD-10-CM | POA: Diagnosis not present

## 2015-05-29 DIAGNOSIS — S72141D Displaced intertrochanteric fracture of right femur, subsequent encounter for closed fracture with routine healing: Secondary | ICD-10-CM | POA: Diagnosis not present

## 2015-05-29 DIAGNOSIS — Z96651 Presence of right artificial knee joint: Secondary | ICD-10-CM | POA: Diagnosis not present

## 2015-05-29 DIAGNOSIS — Z602 Problems related to living alone: Secondary | ICD-10-CM | POA: Diagnosis not present

## 2015-05-29 DIAGNOSIS — I480 Paroxysmal atrial fibrillation: Secondary | ICD-10-CM | POA: Diagnosis not present

## 2015-05-29 DIAGNOSIS — Z7952 Long term (current) use of systemic steroids: Secondary | ICD-10-CM | POA: Diagnosis not present

## 2015-05-29 DIAGNOSIS — Z7982 Long term (current) use of aspirin: Secondary | ICD-10-CM | POA: Diagnosis not present

## 2015-05-30 DIAGNOSIS — D649 Anemia, unspecified: Secondary | ICD-10-CM | POA: Diagnosis not present

## 2015-05-30 DIAGNOSIS — I10 Essential (primary) hypertension: Secondary | ICD-10-CM | POA: Diagnosis not present

## 2015-05-30 DIAGNOSIS — I480 Paroxysmal atrial fibrillation: Secondary | ICD-10-CM | POA: Diagnosis not present

## 2015-05-30 DIAGNOSIS — Z79891 Long term (current) use of opiate analgesic: Secondary | ICD-10-CM | POA: Diagnosis not present

## 2015-05-30 DIAGNOSIS — S72141D Displaced intertrochanteric fracture of right femur, subsequent encounter for closed fracture with routine healing: Secondary | ICD-10-CM | POA: Diagnosis not present

## 2015-05-30 DIAGNOSIS — Z96651 Presence of right artificial knee joint: Secondary | ICD-10-CM | POA: Diagnosis not present

## 2015-05-30 DIAGNOSIS — M069 Rheumatoid arthritis, unspecified: Secondary | ICD-10-CM | POA: Diagnosis not present

## 2015-05-30 DIAGNOSIS — Z602 Problems related to living alone: Secondary | ICD-10-CM | POA: Diagnosis not present

## 2015-05-30 DIAGNOSIS — M1990 Unspecified osteoarthritis, unspecified site: Secondary | ICD-10-CM | POA: Diagnosis not present

## 2015-05-30 DIAGNOSIS — Z7952 Long term (current) use of systemic steroids: Secondary | ICD-10-CM | POA: Diagnosis not present

## 2015-05-30 DIAGNOSIS — Z7982 Long term (current) use of aspirin: Secondary | ICD-10-CM | POA: Diagnosis not present

## 2015-06-02 DIAGNOSIS — Z7982 Long term (current) use of aspirin: Secondary | ICD-10-CM | POA: Diagnosis not present

## 2015-06-02 DIAGNOSIS — M069 Rheumatoid arthritis, unspecified: Secondary | ICD-10-CM | POA: Diagnosis not present

## 2015-06-02 DIAGNOSIS — M1990 Unspecified osteoarthritis, unspecified site: Secondary | ICD-10-CM | POA: Diagnosis not present

## 2015-06-02 DIAGNOSIS — I10 Essential (primary) hypertension: Secondary | ICD-10-CM | POA: Diagnosis not present

## 2015-06-02 DIAGNOSIS — Z79891 Long term (current) use of opiate analgesic: Secondary | ICD-10-CM | POA: Diagnosis not present

## 2015-06-02 DIAGNOSIS — I480 Paroxysmal atrial fibrillation: Secondary | ICD-10-CM | POA: Diagnosis not present

## 2015-06-02 DIAGNOSIS — Z7952 Long term (current) use of systemic steroids: Secondary | ICD-10-CM | POA: Diagnosis not present

## 2015-06-02 DIAGNOSIS — S72141D Displaced intertrochanteric fracture of right femur, subsequent encounter for closed fracture with routine healing: Secondary | ICD-10-CM | POA: Diagnosis not present

## 2015-06-02 DIAGNOSIS — D649 Anemia, unspecified: Secondary | ICD-10-CM | POA: Diagnosis not present

## 2015-06-02 DIAGNOSIS — Z96651 Presence of right artificial knee joint: Secondary | ICD-10-CM | POA: Diagnosis not present

## 2015-06-02 DIAGNOSIS — Z602 Problems related to living alone: Secondary | ICD-10-CM | POA: Diagnosis not present

## 2015-06-03 DIAGNOSIS — Z7952 Long term (current) use of systemic steroids: Secondary | ICD-10-CM | POA: Diagnosis not present

## 2015-06-03 DIAGNOSIS — D649 Anemia, unspecified: Secondary | ICD-10-CM | POA: Diagnosis not present

## 2015-06-03 DIAGNOSIS — D62 Acute posthemorrhagic anemia: Secondary | ICD-10-CM | POA: Diagnosis not present

## 2015-06-03 DIAGNOSIS — M1990 Unspecified osteoarthritis, unspecified site: Secondary | ICD-10-CM | POA: Diagnosis not present

## 2015-06-03 DIAGNOSIS — Z7982 Long term (current) use of aspirin: Secondary | ICD-10-CM | POA: Diagnosis not present

## 2015-06-03 DIAGNOSIS — Z79891 Long term (current) use of opiate analgesic: Secondary | ICD-10-CM | POA: Diagnosis not present

## 2015-06-03 DIAGNOSIS — Z96651 Presence of right artificial knee joint: Secondary | ICD-10-CM | POA: Diagnosis not present

## 2015-06-03 DIAGNOSIS — I480 Paroxysmal atrial fibrillation: Secondary | ICD-10-CM | POA: Diagnosis not present

## 2015-06-03 DIAGNOSIS — Z6820 Body mass index (BMI) 20.0-20.9, adult: Secondary | ICD-10-CM | POA: Diagnosis not present

## 2015-06-03 DIAGNOSIS — N179 Acute kidney failure, unspecified: Secondary | ICD-10-CM | POA: Diagnosis not present

## 2015-06-03 DIAGNOSIS — Z602 Problems related to living alone: Secondary | ICD-10-CM | POA: Diagnosis not present

## 2015-06-03 DIAGNOSIS — S72141A Displaced intertrochanteric fracture of right femur, initial encounter for closed fracture: Secondary | ICD-10-CM | POA: Diagnosis not present

## 2015-06-03 DIAGNOSIS — I10 Essential (primary) hypertension: Secondary | ICD-10-CM | POA: Diagnosis not present

## 2015-06-03 DIAGNOSIS — M05741 Rheumatoid arthritis with rheumatoid factor of right hand without organ or systems involvement: Secondary | ICD-10-CM | POA: Diagnosis not present

## 2015-06-03 DIAGNOSIS — M069 Rheumatoid arthritis, unspecified: Secondary | ICD-10-CM | POA: Diagnosis not present

## 2015-06-03 DIAGNOSIS — S72141D Displaced intertrochanteric fracture of right femur, subsequent encounter for closed fracture with routine healing: Secondary | ICD-10-CM | POA: Diagnosis not present

## 2015-06-04 DIAGNOSIS — Z96651 Presence of right artificial knee joint: Secondary | ICD-10-CM | POA: Diagnosis not present

## 2015-06-04 DIAGNOSIS — I480 Paroxysmal atrial fibrillation: Secondary | ICD-10-CM | POA: Diagnosis not present

## 2015-06-04 DIAGNOSIS — D649 Anemia, unspecified: Secondary | ICD-10-CM | POA: Diagnosis not present

## 2015-06-04 DIAGNOSIS — Z602 Problems related to living alone: Secondary | ICD-10-CM | POA: Diagnosis not present

## 2015-06-04 DIAGNOSIS — M1990 Unspecified osteoarthritis, unspecified site: Secondary | ICD-10-CM | POA: Diagnosis not present

## 2015-06-04 DIAGNOSIS — S72141D Displaced intertrochanteric fracture of right femur, subsequent encounter for closed fracture with routine healing: Secondary | ICD-10-CM | POA: Diagnosis not present

## 2015-06-04 DIAGNOSIS — Z7982 Long term (current) use of aspirin: Secondary | ICD-10-CM | POA: Diagnosis not present

## 2015-06-04 DIAGNOSIS — Z79891 Long term (current) use of opiate analgesic: Secondary | ICD-10-CM | POA: Diagnosis not present

## 2015-06-04 DIAGNOSIS — Z7952 Long term (current) use of systemic steroids: Secondary | ICD-10-CM | POA: Diagnosis not present

## 2015-06-04 DIAGNOSIS — I10 Essential (primary) hypertension: Secondary | ICD-10-CM | POA: Diagnosis not present

## 2015-06-04 DIAGNOSIS — M069 Rheumatoid arthritis, unspecified: Secondary | ICD-10-CM | POA: Diagnosis not present

## 2015-06-05 DIAGNOSIS — Z7952 Long term (current) use of systemic steroids: Secondary | ICD-10-CM | POA: Diagnosis not present

## 2015-06-05 DIAGNOSIS — Z602 Problems related to living alone: Secondary | ICD-10-CM | POA: Diagnosis not present

## 2015-06-05 DIAGNOSIS — S72141D Displaced intertrochanteric fracture of right femur, subsequent encounter for closed fracture with routine healing: Secondary | ICD-10-CM | POA: Diagnosis not present

## 2015-06-05 DIAGNOSIS — D649 Anemia, unspecified: Secondary | ICD-10-CM | POA: Diagnosis not present

## 2015-06-05 DIAGNOSIS — I480 Paroxysmal atrial fibrillation: Secondary | ICD-10-CM | POA: Diagnosis not present

## 2015-06-05 DIAGNOSIS — Z7982 Long term (current) use of aspirin: Secondary | ICD-10-CM | POA: Diagnosis not present

## 2015-06-05 DIAGNOSIS — M069 Rheumatoid arthritis, unspecified: Secondary | ICD-10-CM | POA: Diagnosis not present

## 2015-06-05 DIAGNOSIS — Z79891 Long term (current) use of opiate analgesic: Secondary | ICD-10-CM | POA: Diagnosis not present

## 2015-06-05 DIAGNOSIS — Z96651 Presence of right artificial knee joint: Secondary | ICD-10-CM | POA: Diagnosis not present

## 2015-06-05 DIAGNOSIS — M1990 Unspecified osteoarthritis, unspecified site: Secondary | ICD-10-CM | POA: Diagnosis not present

## 2015-06-05 DIAGNOSIS — I10 Essential (primary) hypertension: Secondary | ICD-10-CM | POA: Diagnosis not present

## 2015-06-06 DIAGNOSIS — Z602 Problems related to living alone: Secondary | ICD-10-CM | POA: Diagnosis not present

## 2015-06-06 DIAGNOSIS — I10 Essential (primary) hypertension: Secondary | ICD-10-CM | POA: Diagnosis not present

## 2015-06-06 DIAGNOSIS — I480 Paroxysmal atrial fibrillation: Secondary | ICD-10-CM | POA: Diagnosis not present

## 2015-06-06 DIAGNOSIS — Z7982 Long term (current) use of aspirin: Secondary | ICD-10-CM | POA: Diagnosis not present

## 2015-06-06 DIAGNOSIS — S72141D Displaced intertrochanteric fracture of right femur, subsequent encounter for closed fracture with routine healing: Secondary | ICD-10-CM | POA: Diagnosis not present

## 2015-06-06 DIAGNOSIS — Z79891 Long term (current) use of opiate analgesic: Secondary | ICD-10-CM | POA: Diagnosis not present

## 2015-06-06 DIAGNOSIS — D649 Anemia, unspecified: Secondary | ICD-10-CM | POA: Diagnosis not present

## 2015-06-06 DIAGNOSIS — Z96651 Presence of right artificial knee joint: Secondary | ICD-10-CM | POA: Diagnosis not present

## 2015-06-06 DIAGNOSIS — Z7952 Long term (current) use of systemic steroids: Secondary | ICD-10-CM | POA: Diagnosis not present

## 2015-06-06 DIAGNOSIS — M069 Rheumatoid arthritis, unspecified: Secondary | ICD-10-CM | POA: Diagnosis not present

## 2015-06-06 DIAGNOSIS — M1990 Unspecified osteoarthritis, unspecified site: Secondary | ICD-10-CM | POA: Diagnosis not present

## 2015-06-09 DIAGNOSIS — I480 Paroxysmal atrial fibrillation: Secondary | ICD-10-CM | POA: Diagnosis not present

## 2015-06-09 DIAGNOSIS — M069 Rheumatoid arthritis, unspecified: Secondary | ICD-10-CM | POA: Diagnosis not present

## 2015-06-09 DIAGNOSIS — M1990 Unspecified osteoarthritis, unspecified site: Secondary | ICD-10-CM | POA: Diagnosis not present

## 2015-06-09 DIAGNOSIS — I10 Essential (primary) hypertension: Secondary | ICD-10-CM | POA: Diagnosis not present

## 2015-06-09 DIAGNOSIS — Z7982 Long term (current) use of aspirin: Secondary | ICD-10-CM | POA: Diagnosis not present

## 2015-06-09 DIAGNOSIS — Z96651 Presence of right artificial knee joint: Secondary | ICD-10-CM | POA: Diagnosis not present

## 2015-06-09 DIAGNOSIS — D649 Anemia, unspecified: Secondary | ICD-10-CM | POA: Diagnosis not present

## 2015-06-09 DIAGNOSIS — Z602 Problems related to living alone: Secondary | ICD-10-CM | POA: Diagnosis not present

## 2015-06-09 DIAGNOSIS — Z7952 Long term (current) use of systemic steroids: Secondary | ICD-10-CM | POA: Diagnosis not present

## 2015-06-09 DIAGNOSIS — S72141D Displaced intertrochanteric fracture of right femur, subsequent encounter for closed fracture with routine healing: Secondary | ICD-10-CM | POA: Diagnosis not present

## 2015-06-09 DIAGNOSIS — Z79891 Long term (current) use of opiate analgesic: Secondary | ICD-10-CM | POA: Diagnosis not present

## 2015-06-10 DIAGNOSIS — S72141D Displaced intertrochanteric fracture of right femur, subsequent encounter for closed fracture with routine healing: Secondary | ICD-10-CM | POA: Diagnosis not present

## 2015-06-10 DIAGNOSIS — I480 Paroxysmal atrial fibrillation: Secondary | ICD-10-CM | POA: Diagnosis not present

## 2015-06-10 DIAGNOSIS — Z602 Problems related to living alone: Secondary | ICD-10-CM | POA: Diagnosis not present

## 2015-06-10 DIAGNOSIS — M069 Rheumatoid arthritis, unspecified: Secondary | ICD-10-CM | POA: Diagnosis not present

## 2015-06-10 DIAGNOSIS — I10 Essential (primary) hypertension: Secondary | ICD-10-CM | POA: Diagnosis not present

## 2015-06-10 DIAGNOSIS — Z7982 Long term (current) use of aspirin: Secondary | ICD-10-CM | POA: Diagnosis not present

## 2015-06-10 DIAGNOSIS — M1990 Unspecified osteoarthritis, unspecified site: Secondary | ICD-10-CM | POA: Diagnosis not present

## 2015-06-10 DIAGNOSIS — Z7952 Long term (current) use of systemic steroids: Secondary | ICD-10-CM | POA: Diagnosis not present

## 2015-06-10 DIAGNOSIS — Z79891 Long term (current) use of opiate analgesic: Secondary | ICD-10-CM | POA: Diagnosis not present

## 2015-06-10 DIAGNOSIS — D649 Anemia, unspecified: Secondary | ICD-10-CM | POA: Diagnosis not present

## 2015-06-10 DIAGNOSIS — Z96651 Presence of right artificial knee joint: Secondary | ICD-10-CM | POA: Diagnosis not present

## 2015-06-11 DIAGNOSIS — M1990 Unspecified osteoarthritis, unspecified site: Secondary | ICD-10-CM | POA: Diagnosis not present

## 2015-06-11 DIAGNOSIS — I10 Essential (primary) hypertension: Secondary | ICD-10-CM | POA: Diagnosis not present

## 2015-06-11 DIAGNOSIS — S72141D Displaced intertrochanteric fracture of right femur, subsequent encounter for closed fracture with routine healing: Secondary | ICD-10-CM | POA: Diagnosis not present

## 2015-06-11 DIAGNOSIS — D649 Anemia, unspecified: Secondary | ICD-10-CM | POA: Diagnosis not present

## 2015-06-11 DIAGNOSIS — Z602 Problems related to living alone: Secondary | ICD-10-CM | POA: Diagnosis not present

## 2015-06-11 DIAGNOSIS — S72141A Displaced intertrochanteric fracture of right femur, initial encounter for closed fracture: Secondary | ICD-10-CM | POA: Diagnosis not present

## 2015-06-11 DIAGNOSIS — Z96651 Presence of right artificial knee joint: Secondary | ICD-10-CM | POA: Diagnosis not present

## 2015-06-11 DIAGNOSIS — I480 Paroxysmal atrial fibrillation: Secondary | ICD-10-CM | POA: Diagnosis not present

## 2015-06-11 DIAGNOSIS — M069 Rheumatoid arthritis, unspecified: Secondary | ICD-10-CM | POA: Diagnosis not present

## 2015-06-11 DIAGNOSIS — Z7982 Long term (current) use of aspirin: Secondary | ICD-10-CM | POA: Diagnosis not present

## 2015-06-11 DIAGNOSIS — Z79891 Long term (current) use of opiate analgesic: Secondary | ICD-10-CM | POA: Diagnosis not present

## 2015-06-11 DIAGNOSIS — Z7952 Long term (current) use of systemic steroids: Secondary | ICD-10-CM | POA: Diagnosis not present

## 2015-06-12 DIAGNOSIS — I10 Essential (primary) hypertension: Secondary | ICD-10-CM | POA: Diagnosis not present

## 2015-06-12 DIAGNOSIS — Z96651 Presence of right artificial knee joint: Secondary | ICD-10-CM | POA: Diagnosis not present

## 2015-06-12 DIAGNOSIS — I480 Paroxysmal atrial fibrillation: Secondary | ICD-10-CM | POA: Diagnosis not present

## 2015-06-12 DIAGNOSIS — M1990 Unspecified osteoarthritis, unspecified site: Secondary | ICD-10-CM | POA: Diagnosis not present

## 2015-06-12 DIAGNOSIS — Z79891 Long term (current) use of opiate analgesic: Secondary | ICD-10-CM | POA: Diagnosis not present

## 2015-06-12 DIAGNOSIS — S72141D Displaced intertrochanteric fracture of right femur, subsequent encounter for closed fracture with routine healing: Secondary | ICD-10-CM | POA: Diagnosis not present

## 2015-06-12 DIAGNOSIS — Z7952 Long term (current) use of systemic steroids: Secondary | ICD-10-CM | POA: Diagnosis not present

## 2015-06-12 DIAGNOSIS — D649 Anemia, unspecified: Secondary | ICD-10-CM | POA: Diagnosis not present

## 2015-06-12 DIAGNOSIS — Z7982 Long term (current) use of aspirin: Secondary | ICD-10-CM | POA: Diagnosis not present

## 2015-06-12 DIAGNOSIS — Z602 Problems related to living alone: Secondary | ICD-10-CM | POA: Diagnosis not present

## 2015-06-12 DIAGNOSIS — M069 Rheumatoid arthritis, unspecified: Secondary | ICD-10-CM | POA: Diagnosis not present

## 2015-06-13 DIAGNOSIS — Z602 Problems related to living alone: Secondary | ICD-10-CM | POA: Diagnosis not present

## 2015-06-13 DIAGNOSIS — S72141D Displaced intertrochanteric fracture of right femur, subsequent encounter for closed fracture with routine healing: Secondary | ICD-10-CM | POA: Diagnosis not present

## 2015-06-13 DIAGNOSIS — Z79891 Long term (current) use of opiate analgesic: Secondary | ICD-10-CM | POA: Diagnosis not present

## 2015-06-13 DIAGNOSIS — D649 Anemia, unspecified: Secondary | ICD-10-CM | POA: Diagnosis not present

## 2015-06-13 DIAGNOSIS — I480 Paroxysmal atrial fibrillation: Secondary | ICD-10-CM | POA: Diagnosis not present

## 2015-06-13 DIAGNOSIS — Z7952 Long term (current) use of systemic steroids: Secondary | ICD-10-CM | POA: Diagnosis not present

## 2015-06-13 DIAGNOSIS — M1990 Unspecified osteoarthritis, unspecified site: Secondary | ICD-10-CM | POA: Diagnosis not present

## 2015-06-13 DIAGNOSIS — M069 Rheumatoid arthritis, unspecified: Secondary | ICD-10-CM | POA: Diagnosis not present

## 2015-06-13 DIAGNOSIS — Z7982 Long term (current) use of aspirin: Secondary | ICD-10-CM | POA: Diagnosis not present

## 2015-06-13 DIAGNOSIS — Z96651 Presence of right artificial knee joint: Secondary | ICD-10-CM | POA: Diagnosis not present

## 2015-06-13 DIAGNOSIS — I10 Essential (primary) hypertension: Secondary | ICD-10-CM | POA: Diagnosis not present

## 2015-06-16 DIAGNOSIS — Z7952 Long term (current) use of systemic steroids: Secondary | ICD-10-CM | POA: Diagnosis not present

## 2015-06-16 DIAGNOSIS — Z7982 Long term (current) use of aspirin: Secondary | ICD-10-CM | POA: Diagnosis not present

## 2015-06-16 DIAGNOSIS — Z96651 Presence of right artificial knee joint: Secondary | ICD-10-CM | POA: Diagnosis not present

## 2015-06-16 DIAGNOSIS — Z79891 Long term (current) use of opiate analgesic: Secondary | ICD-10-CM | POA: Diagnosis not present

## 2015-06-16 DIAGNOSIS — Z602 Problems related to living alone: Secondary | ICD-10-CM | POA: Diagnosis not present

## 2015-06-16 DIAGNOSIS — D649 Anemia, unspecified: Secondary | ICD-10-CM | POA: Diagnosis not present

## 2015-06-16 DIAGNOSIS — M069 Rheumatoid arthritis, unspecified: Secondary | ICD-10-CM | POA: Diagnosis not present

## 2015-06-16 DIAGNOSIS — S72141D Displaced intertrochanteric fracture of right femur, subsequent encounter for closed fracture with routine healing: Secondary | ICD-10-CM | POA: Diagnosis not present

## 2015-06-16 DIAGNOSIS — M1990 Unspecified osteoarthritis, unspecified site: Secondary | ICD-10-CM | POA: Diagnosis not present

## 2015-06-16 DIAGNOSIS — I480 Paroxysmal atrial fibrillation: Secondary | ICD-10-CM | POA: Diagnosis not present

## 2015-06-16 DIAGNOSIS — I10 Essential (primary) hypertension: Secondary | ICD-10-CM | POA: Diagnosis not present

## 2015-06-18 DIAGNOSIS — D649 Anemia, unspecified: Secondary | ICD-10-CM | POA: Diagnosis not present

## 2015-06-18 DIAGNOSIS — M1990 Unspecified osteoarthritis, unspecified site: Secondary | ICD-10-CM | POA: Diagnosis not present

## 2015-06-18 DIAGNOSIS — S72141D Displaced intertrochanteric fracture of right femur, subsequent encounter for closed fracture with routine healing: Secondary | ICD-10-CM | POA: Diagnosis not present

## 2015-06-18 DIAGNOSIS — I480 Paroxysmal atrial fibrillation: Secondary | ICD-10-CM | POA: Diagnosis not present

## 2015-06-18 DIAGNOSIS — Z79891 Long term (current) use of opiate analgesic: Secondary | ICD-10-CM | POA: Diagnosis not present

## 2015-06-18 DIAGNOSIS — Z7952 Long term (current) use of systemic steroids: Secondary | ICD-10-CM | POA: Diagnosis not present

## 2015-06-18 DIAGNOSIS — Z602 Problems related to living alone: Secondary | ICD-10-CM | POA: Diagnosis not present

## 2015-06-18 DIAGNOSIS — Z96651 Presence of right artificial knee joint: Secondary | ICD-10-CM | POA: Diagnosis not present

## 2015-06-18 DIAGNOSIS — M069 Rheumatoid arthritis, unspecified: Secondary | ICD-10-CM | POA: Diagnosis not present

## 2015-06-18 DIAGNOSIS — Z7982 Long term (current) use of aspirin: Secondary | ICD-10-CM | POA: Diagnosis not present

## 2015-06-18 DIAGNOSIS — I10 Essential (primary) hypertension: Secondary | ICD-10-CM | POA: Diagnosis not present

## 2015-07-07 DIAGNOSIS — I10 Essential (primary) hypertension: Secondary | ICD-10-CM | POA: Diagnosis not present

## 2015-07-07 DIAGNOSIS — H8113 Benign paroxysmal vertigo, bilateral: Secondary | ICD-10-CM | POA: Diagnosis not present

## 2015-07-07 DIAGNOSIS — I480 Paroxysmal atrial fibrillation: Secondary | ICD-10-CM | POA: Diagnosis not present

## 2015-07-07 DIAGNOSIS — H6123 Impacted cerumen, bilateral: Secondary | ICD-10-CM | POA: Diagnosis not present

## 2015-07-07 DIAGNOSIS — Z6820 Body mass index (BMI) 20.0-20.9, adult: Secondary | ICD-10-CM | POA: Diagnosis not present

## 2015-07-14 DIAGNOSIS — Z87440 Personal history of urinary (tract) infections: Secondary | ICD-10-CM | POA: Diagnosis not present

## 2015-07-14 DIAGNOSIS — E860 Dehydration: Secondary | ICD-10-CM | POA: Diagnosis not present

## 2015-07-14 DIAGNOSIS — Z885 Allergy status to narcotic agent status: Secondary | ICD-10-CM | POA: Diagnosis not present

## 2015-07-14 DIAGNOSIS — S0990XA Unspecified injury of head, initial encounter: Secondary | ICD-10-CM | POA: Diagnosis not present

## 2015-07-14 DIAGNOSIS — M069 Rheumatoid arthritis, unspecified: Secondary | ICD-10-CM | POA: Diagnosis not present

## 2015-07-14 DIAGNOSIS — I519 Heart disease, unspecified: Secondary | ICD-10-CM | POA: Diagnosis not present

## 2015-07-14 DIAGNOSIS — M810 Age-related osteoporosis without current pathological fracture: Secondary | ICD-10-CM | POA: Diagnosis not present

## 2015-07-14 DIAGNOSIS — Z888 Allergy status to other drugs, medicaments and biological substances status: Secondary | ICD-10-CM | POA: Diagnosis not present

## 2015-07-14 DIAGNOSIS — I10 Essential (primary) hypertension: Secondary | ICD-10-CM | POA: Diagnosis not present

## 2015-07-14 DIAGNOSIS — S72009A Fracture of unspecified part of neck of unspecified femur, initial encounter for closed fracture: Secondary | ICD-10-CM | POA: Diagnosis not present

## 2015-07-14 DIAGNOSIS — Z79899 Other long term (current) drug therapy: Secondary | ICD-10-CM | POA: Diagnosis not present

## 2015-07-14 DIAGNOSIS — B9620 Unspecified Escherichia coli [E. coli] as the cause of diseases classified elsewhere: Secondary | ICD-10-CM | POA: Diagnosis not present

## 2015-07-14 DIAGNOSIS — S3289XA Fracture of other parts of pelvis, initial encounter for closed fracture: Secondary | ICD-10-CM | POA: Diagnosis not present

## 2015-07-14 DIAGNOSIS — M1990 Unspecified osteoarthritis, unspecified site: Secondary | ICD-10-CM | POA: Diagnosis not present

## 2015-07-14 DIAGNOSIS — S299XXA Unspecified injury of thorax, initial encounter: Secondary | ICD-10-CM | POA: Diagnosis not present

## 2015-07-14 DIAGNOSIS — N3001 Acute cystitis with hematuria: Secondary | ICD-10-CM | POA: Diagnosis not present

## 2015-07-14 DIAGNOSIS — N390 Urinary tract infection, site not specified: Secondary | ICD-10-CM | POA: Diagnosis not present

## 2015-07-14 DIAGNOSIS — S72331A Displaced oblique fracture of shaft of right femur, initial encounter for closed fracture: Secondary | ICD-10-CM | POA: Diagnosis not present

## 2015-07-14 DIAGNOSIS — S72301A Unspecified fracture of shaft of right femur, initial encounter for closed fracture: Secondary | ICD-10-CM | POA: Diagnosis not present

## 2015-07-14 DIAGNOSIS — K219 Gastro-esophageal reflux disease without esophagitis: Secondary | ICD-10-CM | POA: Diagnosis not present

## 2015-07-14 DIAGNOSIS — M79604 Pain in right leg: Secondary | ICD-10-CM | POA: Diagnosis not present

## 2015-07-14 DIAGNOSIS — S72301D Unspecified fracture of shaft of right femur, subsequent encounter for closed fracture with routine healing: Secondary | ICD-10-CM | POA: Diagnosis not present

## 2015-07-14 DIAGNOSIS — I480 Paroxysmal atrial fibrillation: Secondary | ICD-10-CM | POA: Diagnosis not present

## 2015-07-14 DIAGNOSIS — R1084 Generalized abdominal pain: Secondary | ICD-10-CM | POA: Diagnosis not present

## 2015-07-16 DIAGNOSIS — M810 Age-related osteoporosis without current pathological fracture: Secondary | ICD-10-CM | POA: Diagnosis not present

## 2015-07-16 DIAGNOSIS — M439 Deforming dorsopathy, unspecified: Secondary | ICD-10-CM | POA: Diagnosis not present

## 2015-07-16 DIAGNOSIS — S72141A Displaced intertrochanteric fracture of right femur, initial encounter for closed fracture: Secondary | ICD-10-CM | POA: Diagnosis not present

## 2015-07-16 DIAGNOSIS — M069 Rheumatoid arthritis, unspecified: Secondary | ICD-10-CM | POA: Diagnosis not present

## 2015-07-16 DIAGNOSIS — M79604 Pain in right leg: Secondary | ICD-10-CM | POA: Diagnosis not present

## 2015-07-16 DIAGNOSIS — S72009A Fracture of unspecified part of neck of unspecified femur, initial encounter for closed fracture: Secondary | ICD-10-CM | POA: Diagnosis not present

## 2015-07-16 DIAGNOSIS — S72301A Unspecified fracture of shaft of right femur, initial encounter for closed fracture: Secondary | ICD-10-CM | POA: Diagnosis not present

## 2015-07-16 DIAGNOSIS — R262 Difficulty in walking, not elsewhere classified: Secondary | ICD-10-CM | POA: Diagnosis not present

## 2015-07-16 DIAGNOSIS — N3001 Acute cystitis with hematuria: Secondary | ICD-10-CM | POA: Diagnosis not present

## 2015-07-16 DIAGNOSIS — N390 Urinary tract infection, site not specified: Secondary | ICD-10-CM | POA: Diagnosis not present

## 2015-07-16 DIAGNOSIS — S72301D Unspecified fracture of shaft of right femur, subsequent encounter for closed fracture with routine healing: Secondary | ICD-10-CM | POA: Diagnosis not present

## 2015-07-17 DIAGNOSIS — S72301D Unspecified fracture of shaft of right femur, subsequent encounter for closed fracture with routine healing: Secondary | ICD-10-CM | POA: Diagnosis not present

## 2015-07-17 DIAGNOSIS — N390 Urinary tract infection, site not specified: Secondary | ICD-10-CM | POA: Diagnosis not present

## 2015-07-17 DIAGNOSIS — M439 Deforming dorsopathy, unspecified: Secondary | ICD-10-CM | POA: Diagnosis not present

## 2015-07-17 DIAGNOSIS — R262 Difficulty in walking, not elsewhere classified: Secondary | ICD-10-CM | POA: Diagnosis not present

## 2015-07-23 DIAGNOSIS — S72301A Unspecified fracture of shaft of right femur, initial encounter for closed fracture: Secondary | ICD-10-CM | POA: Diagnosis not present

## 2015-07-23 DIAGNOSIS — S72141A Displaced intertrochanteric fracture of right femur, initial encounter for closed fracture: Secondary | ICD-10-CM | POA: Diagnosis not present

## 2015-08-11 DIAGNOSIS — M069 Rheumatoid arthritis, unspecified: Secondary | ICD-10-CM | POA: Diagnosis not present

## 2015-08-11 DIAGNOSIS — F0390 Unspecified dementia without behavioral disturbance: Secondary | ICD-10-CM | POA: Diagnosis not present

## 2015-08-11 DIAGNOSIS — S72301D Unspecified fracture of shaft of right femur, subsequent encounter for closed fracture with routine healing: Secondary | ICD-10-CM | POA: Diagnosis not present

## 2015-08-11 DIAGNOSIS — D529 Folate deficiency anemia, unspecified: Secondary | ICD-10-CM | POA: Diagnosis not present

## 2015-08-12 DIAGNOSIS — M069 Rheumatoid arthritis, unspecified: Secondary | ICD-10-CM | POA: Diagnosis not present

## 2015-08-12 DIAGNOSIS — E039 Hypothyroidism, unspecified: Secondary | ICD-10-CM | POA: Diagnosis not present

## 2015-08-12 DIAGNOSIS — D649 Anemia, unspecified: Secondary | ICD-10-CM | POA: Diagnosis not present

## 2015-08-12 DIAGNOSIS — M80051D Age-related osteoporosis with current pathological fracture, right femur, subsequent encounter for fracture with routine healing: Secondary | ICD-10-CM | POA: Diagnosis not present

## 2015-08-12 DIAGNOSIS — Z96651 Presence of right artificial knee joint: Secondary | ICD-10-CM | POA: Diagnosis not present

## 2015-08-12 DIAGNOSIS — Z87440 Personal history of urinary (tract) infections: Secondary | ICD-10-CM | POA: Diagnosis not present

## 2015-08-12 DIAGNOSIS — I10 Essential (primary) hypertension: Secondary | ICD-10-CM | POA: Diagnosis not present

## 2015-08-12 DIAGNOSIS — I509 Heart failure, unspecified: Secondary | ICD-10-CM | POA: Diagnosis not present

## 2015-08-12 DIAGNOSIS — M6281 Muscle weakness (generalized): Secondary | ICD-10-CM | POA: Diagnosis not present

## 2015-08-12 DIAGNOSIS — Z7982 Long term (current) use of aspirin: Secondary | ICD-10-CM | POA: Diagnosis not present

## 2015-08-15 DIAGNOSIS — I509 Heart failure, unspecified: Secondary | ICD-10-CM | POA: Diagnosis not present

## 2015-08-15 DIAGNOSIS — M069 Rheumatoid arthritis, unspecified: Secondary | ICD-10-CM | POA: Diagnosis not present

## 2015-08-15 DIAGNOSIS — I10 Essential (primary) hypertension: Secondary | ICD-10-CM | POA: Diagnosis not present

## 2015-08-15 DIAGNOSIS — E039 Hypothyroidism, unspecified: Secondary | ICD-10-CM | POA: Diagnosis not present

## 2015-08-15 DIAGNOSIS — Z7982 Long term (current) use of aspirin: Secondary | ICD-10-CM | POA: Diagnosis not present

## 2015-08-15 DIAGNOSIS — M6281 Muscle weakness (generalized): Secondary | ICD-10-CM | POA: Diagnosis not present

## 2015-08-15 DIAGNOSIS — Z96651 Presence of right artificial knee joint: Secondary | ICD-10-CM | POA: Diagnosis not present

## 2015-08-15 DIAGNOSIS — D649 Anemia, unspecified: Secondary | ICD-10-CM | POA: Diagnosis not present

## 2015-08-15 DIAGNOSIS — M80051D Age-related osteoporosis with current pathological fracture, right femur, subsequent encounter for fracture with routine healing: Secondary | ICD-10-CM | POA: Diagnosis not present

## 2015-08-15 DIAGNOSIS — Z87440 Personal history of urinary (tract) infections: Secondary | ICD-10-CM | POA: Diagnosis not present

## 2015-08-19 DIAGNOSIS — I5030 Unspecified diastolic (congestive) heart failure: Secondary | ICD-10-CM | POA: Diagnosis not present

## 2015-08-19 DIAGNOSIS — E039 Hypothyroidism, unspecified: Secondary | ICD-10-CM | POA: Diagnosis not present

## 2015-08-19 DIAGNOSIS — Z87440 Personal history of urinary (tract) infections: Secondary | ICD-10-CM | POA: Diagnosis not present

## 2015-08-19 DIAGNOSIS — M80051D Age-related osteoporosis with current pathological fracture, right femur, subsequent encounter for fracture with routine healing: Secondary | ICD-10-CM | POA: Diagnosis not present

## 2015-08-19 DIAGNOSIS — D649 Anemia, unspecified: Secondary | ICD-10-CM | POA: Diagnosis not present

## 2015-08-19 DIAGNOSIS — I10 Essential (primary) hypertension: Secondary | ICD-10-CM | POA: Diagnosis not present

## 2015-08-19 DIAGNOSIS — M6281 Muscle weakness (generalized): Secondary | ICD-10-CM | POA: Diagnosis not present

## 2015-08-19 DIAGNOSIS — I509 Heart failure, unspecified: Secondary | ICD-10-CM | POA: Diagnosis not present

## 2015-08-19 DIAGNOSIS — Z96651 Presence of right artificial knee joint: Secondary | ICD-10-CM | POA: Diagnosis not present

## 2015-08-19 DIAGNOSIS — Z7982 Long term (current) use of aspirin: Secondary | ICD-10-CM | POA: Diagnosis not present

## 2015-08-19 DIAGNOSIS — M069 Rheumatoid arthritis, unspecified: Secondary | ICD-10-CM | POA: Diagnosis not present

## 2015-08-20 DIAGNOSIS — Z87440 Personal history of urinary (tract) infections: Secondary | ICD-10-CM | POA: Diagnosis not present

## 2015-08-20 DIAGNOSIS — M069 Rheumatoid arthritis, unspecified: Secondary | ICD-10-CM | POA: Diagnosis not present

## 2015-08-20 DIAGNOSIS — E038 Other specified hypothyroidism: Secondary | ICD-10-CM | POA: Diagnosis not present

## 2015-08-20 DIAGNOSIS — D649 Anemia, unspecified: Secondary | ICD-10-CM | POA: Diagnosis not present

## 2015-08-20 DIAGNOSIS — E039 Hypothyroidism, unspecified: Secondary | ICD-10-CM | POA: Diagnosis not present

## 2015-08-20 DIAGNOSIS — S72301A Unspecified fracture of shaft of right femur, initial encounter for closed fracture: Secondary | ICD-10-CM | POA: Diagnosis not present

## 2015-08-20 DIAGNOSIS — I509 Heart failure, unspecified: Secondary | ICD-10-CM | POA: Diagnosis not present

## 2015-08-20 DIAGNOSIS — M6281 Muscle weakness (generalized): Secondary | ICD-10-CM | POA: Diagnosis not present

## 2015-08-20 DIAGNOSIS — Z79899 Other long term (current) drug therapy: Secondary | ICD-10-CM | POA: Diagnosis not present

## 2015-08-20 DIAGNOSIS — S72141A Displaced intertrochanteric fracture of right femur, initial encounter for closed fracture: Secondary | ICD-10-CM | POA: Diagnosis not present

## 2015-08-20 DIAGNOSIS — Z96651 Presence of right artificial knee joint: Secondary | ICD-10-CM | POA: Diagnosis not present

## 2015-08-20 DIAGNOSIS — I10 Essential (primary) hypertension: Secondary | ICD-10-CM | POA: Diagnosis not present

## 2015-08-20 DIAGNOSIS — Z7982 Long term (current) use of aspirin: Secondary | ICD-10-CM | POA: Diagnosis not present

## 2015-08-20 DIAGNOSIS — M80051D Age-related osteoporosis with current pathological fracture, right femur, subsequent encounter for fracture with routine healing: Secondary | ICD-10-CM | POA: Diagnosis not present

## 2015-08-20 DIAGNOSIS — D518 Other vitamin B12 deficiency anemias: Secondary | ICD-10-CM | POA: Diagnosis not present

## 2015-08-25 DIAGNOSIS — M80051D Age-related osteoporosis with current pathological fracture, right femur, subsequent encounter for fracture with routine healing: Secondary | ICD-10-CM | POA: Diagnosis not present

## 2015-08-25 DIAGNOSIS — M6281 Muscle weakness (generalized): Secondary | ICD-10-CM | POA: Diagnosis not present

## 2015-08-25 DIAGNOSIS — Z87440 Personal history of urinary (tract) infections: Secondary | ICD-10-CM | POA: Diagnosis not present

## 2015-08-25 DIAGNOSIS — E039 Hypothyroidism, unspecified: Secondary | ICD-10-CM | POA: Diagnosis not present

## 2015-08-25 DIAGNOSIS — I10 Essential (primary) hypertension: Secondary | ICD-10-CM | POA: Diagnosis not present

## 2015-08-25 DIAGNOSIS — M069 Rheumatoid arthritis, unspecified: Secondary | ICD-10-CM | POA: Diagnosis not present

## 2015-08-25 DIAGNOSIS — Z7982 Long term (current) use of aspirin: Secondary | ICD-10-CM | POA: Diagnosis not present

## 2015-08-25 DIAGNOSIS — D649 Anemia, unspecified: Secondary | ICD-10-CM | POA: Diagnosis not present

## 2015-08-25 DIAGNOSIS — I509 Heart failure, unspecified: Secondary | ICD-10-CM | POA: Diagnosis not present

## 2015-08-25 DIAGNOSIS — Z96651 Presence of right artificial knee joint: Secondary | ICD-10-CM | POA: Diagnosis not present

## 2015-08-28 DIAGNOSIS — L03114 Cellulitis of left upper limb: Secondary | ICD-10-CM | POA: Diagnosis not present

## 2015-08-28 DIAGNOSIS — I10 Essential (primary) hypertension: Secondary | ICD-10-CM | POA: Diagnosis not present

## 2015-08-28 DIAGNOSIS — Z7982 Long term (current) use of aspirin: Secondary | ICD-10-CM | POA: Diagnosis not present

## 2015-08-28 DIAGNOSIS — M79609 Pain in unspecified limb: Secondary | ICD-10-CM | POA: Diagnosis not present

## 2015-08-28 DIAGNOSIS — Z96651 Presence of right artificial knee joint: Secondary | ICD-10-CM | POA: Diagnosis not present

## 2015-08-28 DIAGNOSIS — I509 Heart failure, unspecified: Secondary | ICD-10-CM | POA: Diagnosis not present

## 2015-08-28 DIAGNOSIS — D649 Anemia, unspecified: Secondary | ICD-10-CM | POA: Diagnosis not present

## 2015-08-28 DIAGNOSIS — E039 Hypothyroidism, unspecified: Secondary | ICD-10-CM | POA: Diagnosis not present

## 2015-08-28 DIAGNOSIS — Z87440 Personal history of urinary (tract) infections: Secondary | ICD-10-CM | POA: Diagnosis not present

## 2015-08-28 DIAGNOSIS — M069 Rheumatoid arthritis, unspecified: Secondary | ICD-10-CM | POA: Diagnosis not present

## 2015-08-28 DIAGNOSIS — M80051D Age-related osteoporosis with current pathological fracture, right femur, subsequent encounter for fracture with routine healing: Secondary | ICD-10-CM | POA: Diagnosis not present

## 2015-08-28 DIAGNOSIS — M6281 Muscle weakness (generalized): Secondary | ICD-10-CM | POA: Diagnosis not present

## 2015-09-01 DIAGNOSIS — Z87440 Personal history of urinary (tract) infections: Secondary | ICD-10-CM | POA: Diagnosis not present

## 2015-09-01 DIAGNOSIS — D649 Anemia, unspecified: Secondary | ICD-10-CM | POA: Diagnosis not present

## 2015-09-01 DIAGNOSIS — M6281 Muscle weakness (generalized): Secondary | ICD-10-CM | POA: Diagnosis not present

## 2015-09-01 DIAGNOSIS — E039 Hypothyroidism, unspecified: Secondary | ICD-10-CM | POA: Diagnosis not present

## 2015-09-01 DIAGNOSIS — Z96651 Presence of right artificial knee joint: Secondary | ICD-10-CM | POA: Diagnosis not present

## 2015-09-01 DIAGNOSIS — L03114 Cellulitis of left upper limb: Secondary | ICD-10-CM | POA: Diagnosis not present

## 2015-09-01 DIAGNOSIS — I509 Heart failure, unspecified: Secondary | ICD-10-CM | POA: Diagnosis not present

## 2015-09-01 DIAGNOSIS — M069 Rheumatoid arthritis, unspecified: Secondary | ICD-10-CM | POA: Diagnosis not present

## 2015-09-01 DIAGNOSIS — Z7982 Long term (current) use of aspirin: Secondary | ICD-10-CM | POA: Diagnosis not present

## 2015-09-01 DIAGNOSIS — I10 Essential (primary) hypertension: Secondary | ICD-10-CM | POA: Diagnosis not present

## 2015-09-01 DIAGNOSIS — M80051D Age-related osteoporosis with current pathological fracture, right femur, subsequent encounter for fracture with routine healing: Secondary | ICD-10-CM | POA: Diagnosis not present

## 2015-09-02 DIAGNOSIS — S72141D Displaced intertrochanteric fracture of right femur, subsequent encounter for closed fracture with routine healing: Secondary | ICD-10-CM | POA: Diagnosis not present

## 2015-09-02 DIAGNOSIS — S72344A Nondisplaced spiral fracture of shaft of right femur, initial encounter for closed fracture: Secondary | ICD-10-CM | POA: Diagnosis not present

## 2015-09-02 DIAGNOSIS — S72301D Unspecified fracture of shaft of right femur, subsequent encounter for closed fracture with routine healing: Secondary | ICD-10-CM | POA: Diagnosis not present

## 2015-09-03 DIAGNOSIS — Z7982 Long term (current) use of aspirin: Secondary | ICD-10-CM | POA: Diagnosis not present

## 2015-09-03 DIAGNOSIS — Z96651 Presence of right artificial knee joint: Secondary | ICD-10-CM | POA: Diagnosis not present

## 2015-09-03 DIAGNOSIS — M80051D Age-related osteoporosis with current pathological fracture, right femur, subsequent encounter for fracture with routine healing: Secondary | ICD-10-CM | POA: Diagnosis not present

## 2015-09-03 DIAGNOSIS — M6281 Muscle weakness (generalized): Secondary | ICD-10-CM | POA: Diagnosis not present

## 2015-09-03 DIAGNOSIS — E039 Hypothyroidism, unspecified: Secondary | ICD-10-CM | POA: Diagnosis not present

## 2015-09-03 DIAGNOSIS — D649 Anemia, unspecified: Secondary | ICD-10-CM | POA: Diagnosis not present

## 2015-09-03 DIAGNOSIS — I10 Essential (primary) hypertension: Secondary | ICD-10-CM | POA: Diagnosis not present

## 2015-09-03 DIAGNOSIS — M069 Rheumatoid arthritis, unspecified: Secondary | ICD-10-CM | POA: Diagnosis not present

## 2015-09-03 DIAGNOSIS — Z87440 Personal history of urinary (tract) infections: Secondary | ICD-10-CM | POA: Diagnosis not present

## 2015-09-03 DIAGNOSIS — I509 Heart failure, unspecified: Secondary | ICD-10-CM | POA: Diagnosis not present

## 2015-10-15 DIAGNOSIS — S72141A Displaced intertrochanteric fracture of right femur, initial encounter for closed fracture: Secondary | ICD-10-CM | POA: Diagnosis not present

## 2015-10-23 DIAGNOSIS — S72301A Unspecified fracture of shaft of right femur, initial encounter for closed fracture: Secondary | ICD-10-CM | POA: Diagnosis not present

## 2015-10-23 DIAGNOSIS — I471 Supraventricular tachycardia: Secondary | ICD-10-CM | POA: Diagnosis not present

## 2015-10-23 DIAGNOSIS — E559 Vitamin D deficiency, unspecified: Secondary | ICD-10-CM | POA: Diagnosis not present

## 2015-10-23 DIAGNOSIS — N390 Urinary tract infection, site not specified: Secondary | ICD-10-CM | POA: Diagnosis not present

## 2015-10-23 DIAGNOSIS — M05741 Rheumatoid arthritis with rheumatoid factor of right hand without organ or systems involvement: Secondary | ICD-10-CM | POA: Diagnosis not present

## 2015-10-23 DIAGNOSIS — I10 Essential (primary) hypertension: Secondary | ICD-10-CM | POA: Diagnosis not present

## 2015-12-01 DIAGNOSIS — R531 Weakness: Secondary | ICD-10-CM | POA: Diagnosis not present

## 2015-12-01 DIAGNOSIS — E611 Iron deficiency: Secondary | ICD-10-CM | POA: Diagnosis not present

## 2015-12-01 DIAGNOSIS — Z6820 Body mass index (BMI) 20.0-20.9, adult: Secondary | ICD-10-CM | POA: Diagnosis not present

## 2015-12-01 DIAGNOSIS — E538 Deficiency of other specified B group vitamins: Secondary | ICD-10-CM | POA: Diagnosis not present

## 2015-12-09 DIAGNOSIS — M810 Age-related osteoporosis without current pathological fracture: Secondary | ICD-10-CM | POA: Diagnosis not present

## 2015-12-09 DIAGNOSIS — M150 Primary generalized (osteo)arthritis: Secondary | ICD-10-CM | POA: Diagnosis not present

## 2015-12-09 DIAGNOSIS — M0579 Rheumatoid arthritis with rheumatoid factor of multiple sites without organ or systems involvement: Secondary | ICD-10-CM | POA: Diagnosis not present

## 2015-12-30 DIAGNOSIS — M05741 Rheumatoid arthritis with rheumatoid factor of right hand without organ or systems involvement: Secondary | ICD-10-CM | POA: Diagnosis not present

## 2015-12-30 DIAGNOSIS — I10 Essential (primary) hypertension: Secondary | ICD-10-CM | POA: Diagnosis not present

## 2015-12-30 DIAGNOSIS — M810 Age-related osteoporosis without current pathological fracture: Secondary | ICD-10-CM | POA: Diagnosis not present

## 2015-12-30 DIAGNOSIS — Z1389 Encounter for screening for other disorder: Secondary | ICD-10-CM | POA: Diagnosis not present

## 2015-12-30 DIAGNOSIS — I471 Supraventricular tachycardia: Secondary | ICD-10-CM | POA: Diagnosis not present

## 2015-12-30 DIAGNOSIS — E559 Vitamin D deficiency, unspecified: Secondary | ICD-10-CM | POA: Diagnosis not present

## 2015-12-30 DIAGNOSIS — M25551 Pain in right hip: Secondary | ICD-10-CM | POA: Diagnosis not present

## 2015-12-30 DIAGNOSIS — M898X5 Other specified disorders of bone, thigh: Secondary | ICD-10-CM | POA: Diagnosis not present

## 2015-12-30 DIAGNOSIS — S72341A Displaced spiral fracture of shaft of right femur, initial encounter for closed fracture: Secondary | ICD-10-CM | POA: Diagnosis not present

## 2015-12-30 DIAGNOSIS — Z139 Encounter for screening, unspecified: Secondary | ICD-10-CM | POA: Diagnosis not present

## 2015-12-30 DIAGNOSIS — Z9181 History of falling: Secondary | ICD-10-CM | POA: Diagnosis not present

## 2016-01-16 DIAGNOSIS — M8589 Other specified disorders of bone density and structure, multiple sites: Secondary | ICD-10-CM | POA: Diagnosis not present

## 2016-01-16 DIAGNOSIS — Z1231 Encounter for screening mammogram for malignant neoplasm of breast: Secondary | ICD-10-CM | POA: Diagnosis not present

## 2016-01-16 DIAGNOSIS — M810 Age-related osteoporosis without current pathological fracture: Secondary | ICD-10-CM | POA: Diagnosis not present

## 2016-01-22 DIAGNOSIS — R42 Dizziness and giddiness: Secondary | ICD-10-CM | POA: Diagnosis not present

## 2016-01-22 DIAGNOSIS — I4891 Unspecified atrial fibrillation: Secondary | ICD-10-CM | POA: Diagnosis not present

## 2016-01-22 DIAGNOSIS — S0990XA Unspecified injury of head, initial encounter: Secondary | ICD-10-CM | POA: Diagnosis not present

## 2016-01-22 DIAGNOSIS — R404 Transient alteration of awareness: Secondary | ICD-10-CM | POA: Diagnosis not present

## 2016-01-22 DIAGNOSIS — M7122 Synovial cyst of popliteal space [Baker], left knee: Secondary | ICD-10-CM | POA: Diagnosis not present

## 2016-01-22 DIAGNOSIS — R531 Weakness: Secondary | ICD-10-CM | POA: Diagnosis not present

## 2016-01-22 DIAGNOSIS — Z79899 Other long term (current) drug therapy: Secondary | ICD-10-CM | POA: Diagnosis not present

## 2016-01-22 DIAGNOSIS — Z7952 Long term (current) use of systemic steroids: Secondary | ICD-10-CM | POA: Diagnosis not present

## 2016-01-23 DIAGNOSIS — H04123 Dry eye syndrome of bilateral lacrimal glands: Secondary | ICD-10-CM | POA: Diagnosis not present

## 2016-01-23 DIAGNOSIS — Z743 Need for continuous supervision: Secondary | ICD-10-CM | POA: Diagnosis not present

## 2016-01-23 DIAGNOSIS — M069 Rheumatoid arthritis, unspecified: Secondary | ICD-10-CM | POA: Diagnosis not present

## 2016-01-23 DIAGNOSIS — M1990 Unspecified osteoarthritis, unspecified site: Secondary | ICD-10-CM | POA: Diagnosis not present

## 2016-01-23 DIAGNOSIS — R29898 Other symptoms and signs involving the musculoskeletal system: Secondary | ICD-10-CM | POA: Diagnosis not present

## 2016-01-23 DIAGNOSIS — Z741 Need for assistance with personal care: Secondary | ICD-10-CM | POA: Diagnosis not present

## 2016-01-23 DIAGNOSIS — I10 Essential (primary) hypertension: Secondary | ICD-10-CM | POA: Diagnosis not present

## 2016-01-23 DIAGNOSIS — M2550 Pain in unspecified joint: Secondary | ICD-10-CM | POA: Diagnosis not present

## 2016-01-23 DIAGNOSIS — Z7952 Long term (current) use of systemic steroids: Secondary | ICD-10-CM | POA: Diagnosis not present

## 2016-01-23 DIAGNOSIS — I4891 Unspecified atrial fibrillation: Secondary | ICD-10-CM | POA: Diagnosis not present

## 2016-01-23 DIAGNOSIS — Z79899 Other long term (current) drug therapy: Secondary | ICD-10-CM | POA: Diagnosis not present

## 2016-01-23 DIAGNOSIS — D509 Iron deficiency anemia, unspecified: Secondary | ICD-10-CM | POA: Diagnosis not present

## 2016-01-23 DIAGNOSIS — R279 Unspecified lack of coordination: Secondary | ICD-10-CM | POA: Diagnosis not present

## 2016-01-23 DIAGNOSIS — I119 Hypertensive heart disease without heart failure: Secondary | ICD-10-CM | POA: Diagnosis not present

## 2016-01-23 DIAGNOSIS — M6281 Muscle weakness (generalized): Secondary | ICD-10-CM | POA: Diagnosis not present

## 2016-01-23 DIAGNOSIS — M439 Deforming dorsopathy, unspecified: Secondary | ICD-10-CM | POA: Diagnosis not present

## 2016-01-23 DIAGNOSIS — M25562 Pain in left knee: Secondary | ICD-10-CM | POA: Diagnosis not present

## 2016-01-23 DIAGNOSIS — I38 Endocarditis, valve unspecified: Secondary | ICD-10-CM | POA: Diagnosis not present

## 2016-01-23 DIAGNOSIS — G610 Guillain-Barre syndrome: Secondary | ICD-10-CM | POA: Diagnosis not present

## 2016-01-23 DIAGNOSIS — K219 Gastro-esophageal reflux disease without esophagitis: Secondary | ICD-10-CM | POA: Diagnosis not present

## 2016-01-23 DIAGNOSIS — R531 Weakness: Secondary | ICD-10-CM | POA: Diagnosis not present

## 2016-01-26 DIAGNOSIS — M2550 Pain in unspecified joint: Secondary | ICD-10-CM | POA: Diagnosis not present

## 2016-01-26 DIAGNOSIS — I119 Hypertensive heart disease without heart failure: Secondary | ICD-10-CM | POA: Diagnosis not present

## 2016-01-26 DIAGNOSIS — M439 Deforming dorsopathy, unspecified: Secondary | ICD-10-CM | POA: Diagnosis not present

## 2016-01-26 DIAGNOSIS — R29898 Other symptoms and signs involving the musculoskeletal system: Secondary | ICD-10-CM | POA: Diagnosis not present

## 2016-01-28 DIAGNOSIS — H04123 Dry eye syndrome of bilateral lacrimal glands: Secondary | ICD-10-CM | POA: Diagnosis not present

## 2016-02-04 DIAGNOSIS — M810 Age-related osteoporosis without current pathological fracture: Secondary | ICD-10-CM | POA: Diagnosis not present

## 2016-02-04 DIAGNOSIS — Z79899 Other long term (current) drug therapy: Secondary | ICD-10-CM | POA: Diagnosis not present

## 2016-02-04 DIAGNOSIS — M1990 Unspecified osteoarthritis, unspecified site: Secondary | ICD-10-CM | POA: Diagnosis not present

## 2016-02-04 DIAGNOSIS — Z602 Problems related to living alone: Secondary | ICD-10-CM | POA: Diagnosis not present

## 2016-02-04 DIAGNOSIS — Z7952 Long term (current) use of systemic steroids: Secondary | ICD-10-CM | POA: Diagnosis not present

## 2016-02-04 DIAGNOSIS — I480 Paroxysmal atrial fibrillation: Secondary | ICD-10-CM | POA: Diagnosis not present

## 2016-02-04 DIAGNOSIS — I129 Hypertensive chronic kidney disease with stage 1 through stage 4 chronic kidney disease, or unspecified chronic kidney disease: Secondary | ICD-10-CM | POA: Diagnosis not present

## 2016-02-04 DIAGNOSIS — M069 Rheumatoid arthritis, unspecified: Secondary | ICD-10-CM | POA: Diagnosis not present

## 2016-02-04 DIAGNOSIS — S72141D Displaced intertrochanteric fracture of right femur, subsequent encounter for closed fracture with routine healing: Secondary | ICD-10-CM | POA: Diagnosis not present

## 2016-02-04 DIAGNOSIS — M25562 Pain in left knee: Secondary | ICD-10-CM | POA: Diagnosis not present

## 2016-02-04 DIAGNOSIS — N189 Chronic kidney disease, unspecified: Secondary | ICD-10-CM | POA: Diagnosis not present

## 2016-02-04 DIAGNOSIS — D649 Anemia, unspecified: Secondary | ICD-10-CM | POA: Diagnosis not present

## 2016-02-09 DIAGNOSIS — I480 Paroxysmal atrial fibrillation: Secondary | ICD-10-CM | POA: Diagnosis not present

## 2016-02-09 DIAGNOSIS — M069 Rheumatoid arthritis, unspecified: Secondary | ICD-10-CM | POA: Diagnosis not present

## 2016-02-09 DIAGNOSIS — M25562 Pain in left knee: Secondary | ICD-10-CM | POA: Diagnosis not present

## 2016-02-09 DIAGNOSIS — M810 Age-related osteoporosis without current pathological fracture: Secondary | ICD-10-CM | POA: Diagnosis not present

## 2016-02-09 DIAGNOSIS — D649 Anemia, unspecified: Secondary | ICD-10-CM | POA: Diagnosis not present

## 2016-02-09 DIAGNOSIS — S72141D Displaced intertrochanteric fracture of right femur, subsequent encounter for closed fracture with routine healing: Secondary | ICD-10-CM | POA: Diagnosis not present

## 2016-02-09 DIAGNOSIS — I129 Hypertensive chronic kidney disease with stage 1 through stage 4 chronic kidney disease, or unspecified chronic kidney disease: Secondary | ICD-10-CM | POA: Diagnosis not present

## 2016-02-09 DIAGNOSIS — Z79899 Other long term (current) drug therapy: Secondary | ICD-10-CM | POA: Diagnosis not present

## 2016-02-09 DIAGNOSIS — M1990 Unspecified osteoarthritis, unspecified site: Secondary | ICD-10-CM | POA: Diagnosis not present

## 2016-02-09 DIAGNOSIS — Z602 Problems related to living alone: Secondary | ICD-10-CM | POA: Diagnosis not present

## 2016-02-09 DIAGNOSIS — Z7952 Long term (current) use of systemic steroids: Secondary | ICD-10-CM | POA: Diagnosis not present

## 2016-02-09 DIAGNOSIS — N189 Chronic kidney disease, unspecified: Secondary | ICD-10-CM | POA: Diagnosis not present

## 2016-02-10 DIAGNOSIS — M069 Rheumatoid arthritis, unspecified: Secondary | ICD-10-CM | POA: Diagnosis not present

## 2016-02-10 DIAGNOSIS — Z79899 Other long term (current) drug therapy: Secondary | ICD-10-CM | POA: Diagnosis not present

## 2016-02-10 DIAGNOSIS — M25562 Pain in left knee: Secondary | ICD-10-CM | POA: Diagnosis not present

## 2016-02-10 DIAGNOSIS — I480 Paroxysmal atrial fibrillation: Secondary | ICD-10-CM | POA: Diagnosis not present

## 2016-02-10 DIAGNOSIS — Z602 Problems related to living alone: Secondary | ICD-10-CM | POA: Diagnosis not present

## 2016-02-10 DIAGNOSIS — N189 Chronic kidney disease, unspecified: Secondary | ICD-10-CM | POA: Diagnosis not present

## 2016-02-10 DIAGNOSIS — S72141D Displaced intertrochanteric fracture of right femur, subsequent encounter for closed fracture with routine healing: Secondary | ICD-10-CM | POA: Diagnosis not present

## 2016-02-10 DIAGNOSIS — M1990 Unspecified osteoarthritis, unspecified site: Secondary | ICD-10-CM | POA: Diagnosis not present

## 2016-02-10 DIAGNOSIS — Z7952 Long term (current) use of systemic steroids: Secondary | ICD-10-CM | POA: Diagnosis not present

## 2016-02-10 DIAGNOSIS — D649 Anemia, unspecified: Secondary | ICD-10-CM | POA: Diagnosis not present

## 2016-02-10 DIAGNOSIS — I129 Hypertensive chronic kidney disease with stage 1 through stage 4 chronic kidney disease, or unspecified chronic kidney disease: Secondary | ICD-10-CM | POA: Diagnosis not present

## 2016-02-10 DIAGNOSIS — M810 Age-related osteoporosis without current pathological fracture: Secondary | ICD-10-CM | POA: Diagnosis not present

## 2016-02-12 DIAGNOSIS — N189 Chronic kidney disease, unspecified: Secondary | ICD-10-CM | POA: Diagnosis not present

## 2016-02-12 DIAGNOSIS — Z602 Problems related to living alone: Secondary | ICD-10-CM | POA: Diagnosis not present

## 2016-02-12 DIAGNOSIS — Z79899 Other long term (current) drug therapy: Secondary | ICD-10-CM | POA: Diagnosis not present

## 2016-02-12 DIAGNOSIS — Z7952 Long term (current) use of systemic steroids: Secondary | ICD-10-CM | POA: Diagnosis not present

## 2016-02-12 DIAGNOSIS — S72141D Displaced intertrochanteric fracture of right femur, subsequent encounter for closed fracture with routine healing: Secondary | ICD-10-CM | POA: Diagnosis not present

## 2016-02-12 DIAGNOSIS — M810 Age-related osteoporosis without current pathological fracture: Secondary | ICD-10-CM | POA: Diagnosis not present

## 2016-02-12 DIAGNOSIS — M1990 Unspecified osteoarthritis, unspecified site: Secondary | ICD-10-CM | POA: Diagnosis not present

## 2016-02-12 DIAGNOSIS — M25562 Pain in left knee: Secondary | ICD-10-CM | POA: Diagnosis not present

## 2016-02-12 DIAGNOSIS — M069 Rheumatoid arthritis, unspecified: Secondary | ICD-10-CM | POA: Diagnosis not present

## 2016-02-12 DIAGNOSIS — I480 Paroxysmal atrial fibrillation: Secondary | ICD-10-CM | POA: Diagnosis not present

## 2016-02-12 DIAGNOSIS — I129 Hypertensive chronic kidney disease with stage 1 through stage 4 chronic kidney disease, or unspecified chronic kidney disease: Secondary | ICD-10-CM | POA: Diagnosis not present

## 2016-02-12 DIAGNOSIS — D649 Anemia, unspecified: Secondary | ICD-10-CM | POA: Diagnosis not present

## 2016-02-13 DIAGNOSIS — I129 Hypertensive chronic kidney disease with stage 1 through stage 4 chronic kidney disease, or unspecified chronic kidney disease: Secondary | ICD-10-CM | POA: Diagnosis not present

## 2016-02-13 DIAGNOSIS — Z602 Problems related to living alone: Secondary | ICD-10-CM | POA: Diagnosis not present

## 2016-02-13 DIAGNOSIS — M069 Rheumatoid arthritis, unspecified: Secondary | ICD-10-CM | POA: Diagnosis not present

## 2016-02-13 DIAGNOSIS — M1990 Unspecified osteoarthritis, unspecified site: Secondary | ICD-10-CM | POA: Diagnosis not present

## 2016-02-13 DIAGNOSIS — M810 Age-related osteoporosis without current pathological fracture: Secondary | ICD-10-CM | POA: Diagnosis not present

## 2016-02-13 DIAGNOSIS — N189 Chronic kidney disease, unspecified: Secondary | ICD-10-CM | POA: Diagnosis not present

## 2016-02-13 DIAGNOSIS — Z7952 Long term (current) use of systemic steroids: Secondary | ICD-10-CM | POA: Diagnosis not present

## 2016-02-13 DIAGNOSIS — I480 Paroxysmal atrial fibrillation: Secondary | ICD-10-CM | POA: Diagnosis not present

## 2016-02-13 DIAGNOSIS — M25562 Pain in left knee: Secondary | ICD-10-CM | POA: Diagnosis not present

## 2016-02-13 DIAGNOSIS — D649 Anemia, unspecified: Secondary | ICD-10-CM | POA: Diagnosis not present

## 2016-02-13 DIAGNOSIS — S72141D Displaced intertrochanteric fracture of right femur, subsequent encounter for closed fracture with routine healing: Secondary | ICD-10-CM | POA: Diagnosis not present

## 2016-02-13 DIAGNOSIS — Z79899 Other long term (current) drug therapy: Secondary | ICD-10-CM | POA: Diagnosis not present

## 2016-02-17 DIAGNOSIS — M25562 Pain in left knee: Secondary | ICD-10-CM | POA: Diagnosis not present

## 2016-02-17 DIAGNOSIS — M810 Age-related osteoporosis without current pathological fracture: Secondary | ICD-10-CM | POA: Diagnosis not present

## 2016-02-17 DIAGNOSIS — I480 Paroxysmal atrial fibrillation: Secondary | ICD-10-CM | POA: Diagnosis not present

## 2016-02-17 DIAGNOSIS — S72141D Displaced intertrochanteric fracture of right femur, subsequent encounter for closed fracture with routine healing: Secondary | ICD-10-CM | POA: Diagnosis not present

## 2016-02-17 DIAGNOSIS — M1990 Unspecified osteoarthritis, unspecified site: Secondary | ICD-10-CM | POA: Diagnosis not present

## 2016-02-17 DIAGNOSIS — D649 Anemia, unspecified: Secondary | ICD-10-CM | POA: Diagnosis not present

## 2016-02-17 DIAGNOSIS — M069 Rheumatoid arthritis, unspecified: Secondary | ICD-10-CM | POA: Diagnosis not present

## 2016-02-17 DIAGNOSIS — Z79899 Other long term (current) drug therapy: Secondary | ICD-10-CM | POA: Diagnosis not present

## 2016-02-17 DIAGNOSIS — I129 Hypertensive chronic kidney disease with stage 1 through stage 4 chronic kidney disease, or unspecified chronic kidney disease: Secondary | ICD-10-CM | POA: Diagnosis not present

## 2016-02-17 DIAGNOSIS — Z7952 Long term (current) use of systemic steroids: Secondary | ICD-10-CM | POA: Diagnosis not present

## 2016-02-17 DIAGNOSIS — Z602 Problems related to living alone: Secondary | ICD-10-CM | POA: Diagnosis not present

## 2016-02-17 DIAGNOSIS — N189 Chronic kidney disease, unspecified: Secondary | ICD-10-CM | POA: Diagnosis not present

## 2016-02-18 DIAGNOSIS — I129 Hypertensive chronic kidney disease with stage 1 through stage 4 chronic kidney disease, or unspecified chronic kidney disease: Secondary | ICD-10-CM | POA: Diagnosis not present

## 2016-02-18 DIAGNOSIS — I480 Paroxysmal atrial fibrillation: Secondary | ICD-10-CM | POA: Diagnosis not present

## 2016-02-18 DIAGNOSIS — D649 Anemia, unspecified: Secondary | ICD-10-CM | POA: Diagnosis not present

## 2016-02-18 DIAGNOSIS — S72141D Displaced intertrochanteric fracture of right femur, subsequent encounter for closed fracture with routine healing: Secondary | ICD-10-CM | POA: Diagnosis not present

## 2016-02-18 DIAGNOSIS — N189 Chronic kidney disease, unspecified: Secondary | ICD-10-CM | POA: Diagnosis not present

## 2016-02-18 DIAGNOSIS — Z79899 Other long term (current) drug therapy: Secondary | ICD-10-CM | POA: Diagnosis not present

## 2016-02-18 DIAGNOSIS — Z7952 Long term (current) use of systemic steroids: Secondary | ICD-10-CM | POA: Diagnosis not present

## 2016-02-18 DIAGNOSIS — M1990 Unspecified osteoarthritis, unspecified site: Secondary | ICD-10-CM | POA: Diagnosis not present

## 2016-02-18 DIAGNOSIS — M25562 Pain in left knee: Secondary | ICD-10-CM | POA: Diagnosis not present

## 2016-02-18 DIAGNOSIS — Z602 Problems related to living alone: Secondary | ICD-10-CM | POA: Diagnosis not present

## 2016-02-18 DIAGNOSIS — M810 Age-related osteoporosis without current pathological fracture: Secondary | ICD-10-CM | POA: Diagnosis not present

## 2016-02-18 DIAGNOSIS — M069 Rheumatoid arthritis, unspecified: Secondary | ICD-10-CM | POA: Diagnosis not present

## 2016-03-01 DIAGNOSIS — I471 Supraventricular tachycardia: Secondary | ICD-10-CM | POA: Diagnosis not present

## 2016-03-01 DIAGNOSIS — M05741 Rheumatoid arthritis with rheumatoid factor of right hand without organ or systems involvement: Secondary | ICD-10-CM | POA: Diagnosis not present

## 2016-03-01 DIAGNOSIS — E559 Vitamin D deficiency, unspecified: Secondary | ICD-10-CM | POA: Diagnosis not present

## 2016-03-01 DIAGNOSIS — Z23 Encounter for immunization: Secondary | ICD-10-CM | POA: Diagnosis not present

## 2016-03-01 DIAGNOSIS — I10 Essential (primary) hypertension: Secondary | ICD-10-CM | POA: Diagnosis not present

## 2016-03-30 DIAGNOSIS — M150 Primary generalized (osteo)arthritis: Secondary | ICD-10-CM | POA: Diagnosis not present

## 2016-03-30 DIAGNOSIS — M0579 Rheumatoid arthritis with rheumatoid factor of multiple sites without organ or systems involvement: Secondary | ICD-10-CM | POA: Diagnosis not present

## 2016-03-30 DIAGNOSIS — M810 Age-related osteoporosis without current pathological fracture: Secondary | ICD-10-CM | POA: Diagnosis not present

## 2016-04-14 DIAGNOSIS — S72301A Unspecified fracture of shaft of right femur, initial encounter for closed fracture: Secondary | ICD-10-CM | POA: Diagnosis not present

## 2016-04-30 DIAGNOSIS — E559 Vitamin D deficiency, unspecified: Secondary | ICD-10-CM | POA: Diagnosis not present

## 2016-04-30 DIAGNOSIS — Z23 Encounter for immunization: Secondary | ICD-10-CM | POA: Diagnosis not present

## 2016-04-30 DIAGNOSIS — E785 Hyperlipidemia, unspecified: Secondary | ICD-10-CM | POA: Diagnosis not present

## 2016-04-30 DIAGNOSIS — M810 Age-related osteoporosis without current pathological fracture: Secondary | ICD-10-CM | POA: Diagnosis not present

## 2016-04-30 DIAGNOSIS — M05741 Rheumatoid arthritis with rheumatoid factor of right hand without organ or systems involvement: Secondary | ICD-10-CM | POA: Diagnosis not present

## 2016-05-31 DIAGNOSIS — M069 Rheumatoid arthritis, unspecified: Secondary | ICD-10-CM | POA: Diagnosis not present

## 2016-05-31 DIAGNOSIS — R42 Dizziness and giddiness: Secondary | ICD-10-CM | POA: Diagnosis not present

## 2016-05-31 DIAGNOSIS — S2242XA Multiple fractures of ribs, left side, initial encounter for closed fracture: Secondary | ICD-10-CM | POA: Diagnosis not present

## 2016-05-31 DIAGNOSIS — M810 Age-related osteoporosis without current pathological fracture: Secondary | ICD-10-CM | POA: Diagnosis not present

## 2016-05-31 DIAGNOSIS — R0789 Other chest pain: Secondary | ICD-10-CM | POA: Diagnosis not present

## 2016-05-31 DIAGNOSIS — S3991XA Unspecified injury of abdomen, initial encounter: Secondary | ICD-10-CM | POA: Diagnosis not present

## 2016-05-31 DIAGNOSIS — R0781 Pleurodynia: Secondary | ICD-10-CM | POA: Diagnosis not present

## 2016-05-31 DIAGNOSIS — S270XXA Traumatic pneumothorax, initial encounter: Secondary | ICD-10-CM | POA: Diagnosis not present

## 2016-05-31 DIAGNOSIS — Z79899 Other long term (current) drug therapy: Secondary | ICD-10-CM | POA: Diagnosis not present

## 2016-05-31 DIAGNOSIS — R10819 Abdominal tenderness, unspecified site: Secondary | ICD-10-CM | POA: Diagnosis not present

## 2016-05-31 DIAGNOSIS — S3690XA Unspecified injury of unspecified intra-abdominal organ, initial encounter: Secondary | ICD-10-CM | POA: Diagnosis not present

## 2016-05-31 DIAGNOSIS — I10 Essential (primary) hypertension: Secondary | ICD-10-CM | POA: Diagnosis not present

## 2016-05-31 DIAGNOSIS — R531 Weakness: Secondary | ICD-10-CM | POA: Diagnosis not present

## 2016-05-31 DIAGNOSIS — R296 Repeated falls: Secondary | ICD-10-CM | POA: Diagnosis not present

## 2016-05-31 DIAGNOSIS — R0602 Shortness of breath: Secondary | ICD-10-CM | POA: Diagnosis not present

## 2016-05-31 DIAGNOSIS — J939 Pneumothorax, unspecified: Secondary | ICD-10-CM | POA: Diagnosis not present

## 2016-06-01 DIAGNOSIS — S270XXA Traumatic pneumothorax, initial encounter: Secondary | ICD-10-CM | POA: Diagnosis not present

## 2016-06-01 DIAGNOSIS — J939 Pneumothorax, unspecified: Secondary | ICD-10-CM | POA: Diagnosis not present

## 2016-06-02 DIAGNOSIS — R6889 Other general symptoms and signs: Secondary | ICD-10-CM | POA: Diagnosis not present

## 2016-06-02 DIAGNOSIS — N390 Urinary tract infection, site not specified: Secondary | ICD-10-CM | POA: Diagnosis not present

## 2016-06-02 DIAGNOSIS — S270XXA Traumatic pneumothorax, initial encounter: Secondary | ICD-10-CM | POA: Diagnosis not present

## 2016-06-02 DIAGNOSIS — R531 Weakness: Secondary | ICD-10-CM | POA: Diagnosis not present

## 2016-06-02 DIAGNOSIS — M439 Deforming dorsopathy, unspecified: Secondary | ICD-10-CM | POA: Diagnosis not present

## 2016-06-02 DIAGNOSIS — S2242XA Multiple fractures of ribs, left side, initial encounter for closed fracture: Secondary | ICD-10-CM | POA: Diagnosis not present

## 2016-06-02 DIAGNOSIS — M069 Rheumatoid arthritis, unspecified: Secondary | ICD-10-CM | POA: Diagnosis not present

## 2016-06-02 DIAGNOSIS — R42 Dizziness and giddiness: Secondary | ICD-10-CM | POA: Diagnosis not present

## 2016-06-02 DIAGNOSIS — S270XXD Traumatic pneumothorax, subsequent encounter: Secondary | ICD-10-CM | POA: Diagnosis not present

## 2016-06-02 DIAGNOSIS — S2242XD Multiple fractures of ribs, left side, subsequent encounter for fracture with routine healing: Secondary | ICD-10-CM | POA: Diagnosis not present

## 2016-06-02 DIAGNOSIS — R109 Unspecified abdominal pain: Secondary | ICD-10-CM | POA: Diagnosis not present

## 2016-06-02 DIAGNOSIS — J189 Pneumonia, unspecified organism: Secondary | ICD-10-CM | POA: Diagnosis not present

## 2016-06-02 DIAGNOSIS — R296 Repeated falls: Secondary | ICD-10-CM | POA: Diagnosis not present

## 2016-06-02 DIAGNOSIS — R0781 Pleurodynia: Secondary | ICD-10-CM | POA: Diagnosis not present

## 2016-06-02 DIAGNOSIS — R0789 Other chest pain: Secondary | ICD-10-CM | POA: Diagnosis not present

## 2016-06-02 DIAGNOSIS — R262 Difficulty in walking, not elsewhere classified: Secondary | ICD-10-CM | POA: Diagnosis not present

## 2016-06-02 DIAGNOSIS — Z79899 Other long term (current) drug therapy: Secondary | ICD-10-CM | POA: Diagnosis not present

## 2016-06-02 DIAGNOSIS — M0689 Other specified rheumatoid arthritis, multiple sites: Secondary | ICD-10-CM | POA: Diagnosis not present

## 2016-06-02 DIAGNOSIS — R0602 Shortness of breath: Secondary | ICD-10-CM | POA: Diagnosis not present

## 2016-06-02 DIAGNOSIS — I10 Essential (primary) hypertension: Secondary | ICD-10-CM | POA: Diagnosis not present

## 2016-06-02 DIAGNOSIS — M810 Age-related osteoporosis without current pathological fracture: Secondary | ICD-10-CM | POA: Diagnosis not present

## 2016-06-07 DIAGNOSIS — N390 Urinary tract infection, site not specified: Secondary | ICD-10-CM | POA: Diagnosis not present

## 2016-06-07 DIAGNOSIS — R262 Difficulty in walking, not elsewhere classified: Secondary | ICD-10-CM | POA: Diagnosis not present

## 2016-06-07 DIAGNOSIS — S2242XD Multiple fractures of ribs, left side, subsequent encounter for fracture with routine healing: Secondary | ICD-10-CM | POA: Diagnosis not present

## 2016-06-07 DIAGNOSIS — M439 Deforming dorsopathy, unspecified: Secondary | ICD-10-CM | POA: Diagnosis not present

## 2016-06-16 DIAGNOSIS — M069 Rheumatoid arthritis, unspecified: Secondary | ICD-10-CM | POA: Diagnosis not present

## 2016-06-16 DIAGNOSIS — I129 Hypertensive chronic kidney disease with stage 1 through stage 4 chronic kidney disease, or unspecified chronic kidney disease: Secondary | ICD-10-CM | POA: Diagnosis not present

## 2016-06-16 DIAGNOSIS — Z9181 History of falling: Secondary | ICD-10-CM | POA: Diagnosis not present

## 2016-06-16 DIAGNOSIS — Z79899 Other long term (current) drug therapy: Secondary | ICD-10-CM | POA: Diagnosis not present

## 2016-06-16 DIAGNOSIS — M6281 Muscle weakness (generalized): Secondary | ICD-10-CM | POA: Diagnosis not present

## 2016-06-16 DIAGNOSIS — M25562 Pain in left knee: Secondary | ICD-10-CM | POA: Diagnosis not present

## 2016-06-16 DIAGNOSIS — M8000XD Age-related osteoporosis with current pathological fracture, unspecified site, subsequent encounter for fracture with routine healing: Secondary | ICD-10-CM | POA: Diagnosis not present

## 2016-06-16 DIAGNOSIS — N189 Chronic kidney disease, unspecified: Secondary | ICD-10-CM | POA: Diagnosis not present

## 2016-06-16 DIAGNOSIS — Z7952 Long term (current) use of systemic steroids: Secondary | ICD-10-CM | POA: Diagnosis not present

## 2016-06-16 DIAGNOSIS — Z602 Problems related to living alone: Secondary | ICD-10-CM | POA: Diagnosis not present

## 2016-06-18 DIAGNOSIS — S2249XA Multiple fractures of ribs, unspecified side, initial encounter for closed fracture: Secondary | ICD-10-CM | POA: Diagnosis not present

## 2016-06-18 DIAGNOSIS — M05741 Rheumatoid arthritis with rheumatoid factor of right hand without organ or systems involvement: Secondary | ICD-10-CM | POA: Diagnosis not present

## 2016-06-18 DIAGNOSIS — E559 Vitamin D deficiency, unspecified: Secondary | ICD-10-CM | POA: Diagnosis not present

## 2016-06-18 DIAGNOSIS — M810 Age-related osteoporosis without current pathological fracture: Secondary | ICD-10-CM | POA: Diagnosis not present

## 2016-06-18 DIAGNOSIS — S272XXA Traumatic hemopneumothorax, initial encounter: Secondary | ICD-10-CM | POA: Diagnosis not present

## 2016-06-20 DIAGNOSIS — Z79899 Other long term (current) drug therapy: Secondary | ICD-10-CM | POA: Diagnosis not present

## 2016-06-20 DIAGNOSIS — I129 Hypertensive chronic kidney disease with stage 1 through stage 4 chronic kidney disease, or unspecified chronic kidney disease: Secondary | ICD-10-CM | POA: Diagnosis not present

## 2016-06-20 DIAGNOSIS — Z602 Problems related to living alone: Secondary | ICD-10-CM | POA: Diagnosis not present

## 2016-06-20 DIAGNOSIS — M6281 Muscle weakness (generalized): Secondary | ICD-10-CM | POA: Diagnosis not present

## 2016-06-20 DIAGNOSIS — M25562 Pain in left knee: Secondary | ICD-10-CM | POA: Diagnosis not present

## 2016-06-20 DIAGNOSIS — M8000XD Age-related osteoporosis with current pathological fracture, unspecified site, subsequent encounter for fracture with routine healing: Secondary | ICD-10-CM | POA: Diagnosis not present

## 2016-06-20 DIAGNOSIS — N189 Chronic kidney disease, unspecified: Secondary | ICD-10-CM | POA: Diagnosis not present

## 2016-06-20 DIAGNOSIS — Z7952 Long term (current) use of systemic steroids: Secondary | ICD-10-CM | POA: Diagnosis not present

## 2016-06-20 DIAGNOSIS — M069 Rheumatoid arthritis, unspecified: Secondary | ICD-10-CM | POA: Diagnosis not present

## 2016-06-20 DIAGNOSIS — Z9181 History of falling: Secondary | ICD-10-CM | POA: Diagnosis not present

## 2016-06-21 DIAGNOSIS — Z7952 Long term (current) use of systemic steroids: Secondary | ICD-10-CM | POA: Diagnosis not present

## 2016-06-21 DIAGNOSIS — Z79899 Other long term (current) drug therapy: Secondary | ICD-10-CM | POA: Diagnosis not present

## 2016-06-21 DIAGNOSIS — M8000XD Age-related osteoporosis with current pathological fracture, unspecified site, subsequent encounter for fracture with routine healing: Secondary | ICD-10-CM | POA: Diagnosis not present

## 2016-06-21 DIAGNOSIS — Z602 Problems related to living alone: Secondary | ICD-10-CM | POA: Diagnosis not present

## 2016-06-21 DIAGNOSIS — M069 Rheumatoid arthritis, unspecified: Secondary | ICD-10-CM | POA: Diagnosis not present

## 2016-06-21 DIAGNOSIS — I129 Hypertensive chronic kidney disease with stage 1 through stage 4 chronic kidney disease, or unspecified chronic kidney disease: Secondary | ICD-10-CM | POA: Diagnosis not present

## 2016-06-21 DIAGNOSIS — Z9181 History of falling: Secondary | ICD-10-CM | POA: Diagnosis not present

## 2016-06-21 DIAGNOSIS — M25562 Pain in left knee: Secondary | ICD-10-CM | POA: Diagnosis not present

## 2016-06-21 DIAGNOSIS — N189 Chronic kidney disease, unspecified: Secondary | ICD-10-CM | POA: Diagnosis not present

## 2016-06-21 DIAGNOSIS — M6281 Muscle weakness (generalized): Secondary | ICD-10-CM | POA: Diagnosis not present

## 2016-06-22 DIAGNOSIS — I129 Hypertensive chronic kidney disease with stage 1 through stage 4 chronic kidney disease, or unspecified chronic kidney disease: Secondary | ICD-10-CM | POA: Diagnosis not present

## 2016-06-22 DIAGNOSIS — N189 Chronic kidney disease, unspecified: Secondary | ICD-10-CM | POA: Diagnosis not present

## 2016-06-22 DIAGNOSIS — Z7952 Long term (current) use of systemic steroids: Secondary | ICD-10-CM | POA: Diagnosis not present

## 2016-06-22 DIAGNOSIS — M6281 Muscle weakness (generalized): Secondary | ICD-10-CM | POA: Diagnosis not present

## 2016-06-22 DIAGNOSIS — M069 Rheumatoid arthritis, unspecified: Secondary | ICD-10-CM | POA: Diagnosis not present

## 2016-06-22 DIAGNOSIS — Z602 Problems related to living alone: Secondary | ICD-10-CM | POA: Diagnosis not present

## 2016-06-22 DIAGNOSIS — M8000XD Age-related osteoporosis with current pathological fracture, unspecified site, subsequent encounter for fracture with routine healing: Secondary | ICD-10-CM | POA: Diagnosis not present

## 2016-06-22 DIAGNOSIS — M25562 Pain in left knee: Secondary | ICD-10-CM | POA: Diagnosis not present

## 2016-06-22 DIAGNOSIS — Z9181 History of falling: Secondary | ICD-10-CM | POA: Diagnosis not present

## 2016-06-22 DIAGNOSIS — Z79899 Other long term (current) drug therapy: Secondary | ICD-10-CM | POA: Diagnosis not present

## 2016-06-23 DIAGNOSIS — M6281 Muscle weakness (generalized): Secondary | ICD-10-CM | POA: Diagnosis not present

## 2016-06-23 DIAGNOSIS — I129 Hypertensive chronic kidney disease with stage 1 through stage 4 chronic kidney disease, or unspecified chronic kidney disease: Secondary | ICD-10-CM | POA: Diagnosis not present

## 2016-06-23 DIAGNOSIS — Z79899 Other long term (current) drug therapy: Secondary | ICD-10-CM | POA: Diagnosis not present

## 2016-06-23 DIAGNOSIS — M069 Rheumatoid arthritis, unspecified: Secondary | ICD-10-CM | POA: Diagnosis not present

## 2016-06-23 DIAGNOSIS — Z9181 History of falling: Secondary | ICD-10-CM | POA: Diagnosis not present

## 2016-06-23 DIAGNOSIS — Z602 Problems related to living alone: Secondary | ICD-10-CM | POA: Diagnosis not present

## 2016-06-23 DIAGNOSIS — Z7952 Long term (current) use of systemic steroids: Secondary | ICD-10-CM | POA: Diagnosis not present

## 2016-06-23 DIAGNOSIS — M8000XD Age-related osteoporosis with current pathological fracture, unspecified site, subsequent encounter for fracture with routine healing: Secondary | ICD-10-CM | POA: Diagnosis not present

## 2016-06-23 DIAGNOSIS — N189 Chronic kidney disease, unspecified: Secondary | ICD-10-CM | POA: Diagnosis not present

## 2016-06-23 DIAGNOSIS — M25562 Pain in left knee: Secondary | ICD-10-CM | POA: Diagnosis not present

## 2016-06-25 DIAGNOSIS — Z9181 History of falling: Secondary | ICD-10-CM | POA: Diagnosis not present

## 2016-06-25 DIAGNOSIS — M6281 Muscle weakness (generalized): Secondary | ICD-10-CM | POA: Diagnosis not present

## 2016-06-25 DIAGNOSIS — M25562 Pain in left knee: Secondary | ICD-10-CM | POA: Diagnosis not present

## 2016-06-25 DIAGNOSIS — M069 Rheumatoid arthritis, unspecified: Secondary | ICD-10-CM | POA: Diagnosis not present

## 2016-06-25 DIAGNOSIS — M8000XD Age-related osteoporosis with current pathological fracture, unspecified site, subsequent encounter for fracture with routine healing: Secondary | ICD-10-CM | POA: Diagnosis not present

## 2016-06-25 DIAGNOSIS — I129 Hypertensive chronic kidney disease with stage 1 through stage 4 chronic kidney disease, or unspecified chronic kidney disease: Secondary | ICD-10-CM | POA: Diagnosis not present

## 2016-06-25 DIAGNOSIS — Z79899 Other long term (current) drug therapy: Secondary | ICD-10-CM | POA: Diagnosis not present

## 2016-06-25 DIAGNOSIS — N189 Chronic kidney disease, unspecified: Secondary | ICD-10-CM | POA: Diagnosis not present

## 2016-06-25 DIAGNOSIS — Z7952 Long term (current) use of systemic steroids: Secondary | ICD-10-CM | POA: Diagnosis not present

## 2016-06-25 DIAGNOSIS — Z602 Problems related to living alone: Secondary | ICD-10-CM | POA: Diagnosis not present

## 2016-06-28 DIAGNOSIS — Z9181 History of falling: Secondary | ICD-10-CM | POA: Diagnosis not present

## 2016-06-28 DIAGNOSIS — N189 Chronic kidney disease, unspecified: Secondary | ICD-10-CM | POA: Diagnosis not present

## 2016-06-28 DIAGNOSIS — Z79899 Other long term (current) drug therapy: Secondary | ICD-10-CM | POA: Diagnosis not present

## 2016-06-28 DIAGNOSIS — Z602 Problems related to living alone: Secondary | ICD-10-CM | POA: Diagnosis not present

## 2016-06-28 DIAGNOSIS — M069 Rheumatoid arthritis, unspecified: Secondary | ICD-10-CM | POA: Diagnosis not present

## 2016-06-28 DIAGNOSIS — I129 Hypertensive chronic kidney disease with stage 1 through stage 4 chronic kidney disease, or unspecified chronic kidney disease: Secondary | ICD-10-CM | POA: Diagnosis not present

## 2016-06-28 DIAGNOSIS — Z7952 Long term (current) use of systemic steroids: Secondary | ICD-10-CM | POA: Diagnosis not present

## 2016-06-28 DIAGNOSIS — M8000XD Age-related osteoporosis with current pathological fracture, unspecified site, subsequent encounter for fracture with routine healing: Secondary | ICD-10-CM | POA: Diagnosis not present

## 2016-06-28 DIAGNOSIS — M25562 Pain in left knee: Secondary | ICD-10-CM | POA: Diagnosis not present

## 2016-06-28 DIAGNOSIS — M6281 Muscle weakness (generalized): Secondary | ICD-10-CM | POA: Diagnosis not present

## 2016-06-29 DIAGNOSIS — N189 Chronic kidney disease, unspecified: Secondary | ICD-10-CM | POA: Diagnosis not present

## 2016-06-29 DIAGNOSIS — M6281 Muscle weakness (generalized): Secondary | ICD-10-CM | POA: Diagnosis not present

## 2016-06-29 DIAGNOSIS — M25562 Pain in left knee: Secondary | ICD-10-CM | POA: Diagnosis not present

## 2016-06-29 DIAGNOSIS — Z9181 History of falling: Secondary | ICD-10-CM | POA: Diagnosis not present

## 2016-06-29 DIAGNOSIS — I129 Hypertensive chronic kidney disease with stage 1 through stage 4 chronic kidney disease, or unspecified chronic kidney disease: Secondary | ICD-10-CM | POA: Diagnosis not present

## 2016-06-29 DIAGNOSIS — Z7952 Long term (current) use of systemic steroids: Secondary | ICD-10-CM | POA: Diagnosis not present

## 2016-06-29 DIAGNOSIS — M069 Rheumatoid arthritis, unspecified: Secondary | ICD-10-CM | POA: Diagnosis not present

## 2016-06-29 DIAGNOSIS — Z79899 Other long term (current) drug therapy: Secondary | ICD-10-CM | POA: Diagnosis not present

## 2016-06-29 DIAGNOSIS — Z602 Problems related to living alone: Secondary | ICD-10-CM | POA: Diagnosis not present

## 2016-06-29 DIAGNOSIS — M8000XD Age-related osteoporosis with current pathological fracture, unspecified site, subsequent encounter for fracture with routine healing: Secondary | ICD-10-CM | POA: Diagnosis not present

## 2016-07-01 DIAGNOSIS — Z602 Problems related to living alone: Secondary | ICD-10-CM | POA: Diagnosis not present

## 2016-07-01 DIAGNOSIS — Z7952 Long term (current) use of systemic steroids: Secondary | ICD-10-CM | POA: Diagnosis not present

## 2016-07-01 DIAGNOSIS — I129 Hypertensive chronic kidney disease with stage 1 through stage 4 chronic kidney disease, or unspecified chronic kidney disease: Secondary | ICD-10-CM | POA: Diagnosis not present

## 2016-07-01 DIAGNOSIS — Z79899 Other long term (current) drug therapy: Secondary | ICD-10-CM | POA: Diagnosis not present

## 2016-07-01 DIAGNOSIS — Z9181 History of falling: Secondary | ICD-10-CM | POA: Diagnosis not present

## 2016-07-01 DIAGNOSIS — M810 Age-related osteoporosis without current pathological fracture: Secondary | ICD-10-CM | POA: Diagnosis not present

## 2016-07-01 DIAGNOSIS — N189 Chronic kidney disease, unspecified: Secondary | ICD-10-CM | POA: Diagnosis not present

## 2016-07-01 DIAGNOSIS — E785 Hyperlipidemia, unspecified: Secondary | ICD-10-CM | POA: Diagnosis not present

## 2016-07-01 DIAGNOSIS — M069 Rheumatoid arthritis, unspecified: Secondary | ICD-10-CM | POA: Diagnosis not present

## 2016-07-01 DIAGNOSIS — M25562 Pain in left knee: Secondary | ICD-10-CM | POA: Diagnosis not present

## 2016-07-01 DIAGNOSIS — M8000XD Age-related osteoporosis with current pathological fracture, unspecified site, subsequent encounter for fracture with routine healing: Secondary | ICD-10-CM | POA: Diagnosis not present

## 2016-07-01 DIAGNOSIS — E559 Vitamin D deficiency, unspecified: Secondary | ICD-10-CM | POA: Diagnosis not present

## 2016-07-01 DIAGNOSIS — M6281 Muscle weakness (generalized): Secondary | ICD-10-CM | POA: Diagnosis not present

## 2016-07-01 DIAGNOSIS — E876 Hypokalemia: Secondary | ICD-10-CM | POA: Diagnosis not present

## 2016-07-01 DIAGNOSIS — M0579 Rheumatoid arthritis with rheumatoid factor of multiple sites without organ or systems involvement: Secondary | ICD-10-CM | POA: Diagnosis not present

## 2016-07-05 DIAGNOSIS — Z602 Problems related to living alone: Secondary | ICD-10-CM | POA: Diagnosis not present

## 2016-07-05 DIAGNOSIS — M8000XD Age-related osteoporosis with current pathological fracture, unspecified site, subsequent encounter for fracture with routine healing: Secondary | ICD-10-CM | POA: Diagnosis not present

## 2016-07-05 DIAGNOSIS — I129 Hypertensive chronic kidney disease with stage 1 through stage 4 chronic kidney disease, or unspecified chronic kidney disease: Secondary | ICD-10-CM | POA: Diagnosis not present

## 2016-07-05 DIAGNOSIS — Z7952 Long term (current) use of systemic steroids: Secondary | ICD-10-CM | POA: Diagnosis not present

## 2016-07-05 DIAGNOSIS — M25562 Pain in left knee: Secondary | ICD-10-CM | POA: Diagnosis not present

## 2016-07-05 DIAGNOSIS — M069 Rheumatoid arthritis, unspecified: Secondary | ICD-10-CM | POA: Diagnosis not present

## 2016-07-05 DIAGNOSIS — M6281 Muscle weakness (generalized): Secondary | ICD-10-CM | POA: Diagnosis not present

## 2016-07-05 DIAGNOSIS — N189 Chronic kidney disease, unspecified: Secondary | ICD-10-CM | POA: Diagnosis not present

## 2016-07-05 DIAGNOSIS — M810 Age-related osteoporosis without current pathological fracture: Secondary | ICD-10-CM | POA: Diagnosis not present

## 2016-07-05 DIAGNOSIS — Z79899 Other long term (current) drug therapy: Secondary | ICD-10-CM | POA: Diagnosis not present

## 2016-07-05 DIAGNOSIS — Z9181 History of falling: Secondary | ICD-10-CM | POA: Diagnosis not present

## 2016-07-06 DIAGNOSIS — M069 Rheumatoid arthritis, unspecified: Secondary | ICD-10-CM | POA: Diagnosis not present

## 2016-07-06 DIAGNOSIS — Z7952 Long term (current) use of systemic steroids: Secondary | ICD-10-CM | POA: Diagnosis not present

## 2016-07-06 DIAGNOSIS — Z9181 History of falling: Secondary | ICD-10-CM | POA: Diagnosis not present

## 2016-07-06 DIAGNOSIS — M25562 Pain in left knee: Secondary | ICD-10-CM | POA: Diagnosis not present

## 2016-07-06 DIAGNOSIS — N189 Chronic kidney disease, unspecified: Secondary | ICD-10-CM | POA: Diagnosis not present

## 2016-07-06 DIAGNOSIS — Z602 Problems related to living alone: Secondary | ICD-10-CM | POA: Diagnosis not present

## 2016-07-06 DIAGNOSIS — M8000XD Age-related osteoporosis with current pathological fracture, unspecified site, subsequent encounter for fracture with routine healing: Secondary | ICD-10-CM | POA: Diagnosis not present

## 2016-07-06 DIAGNOSIS — M6281 Muscle weakness (generalized): Secondary | ICD-10-CM | POA: Diagnosis not present

## 2016-07-06 DIAGNOSIS — Z79899 Other long term (current) drug therapy: Secondary | ICD-10-CM | POA: Diagnosis not present

## 2016-07-06 DIAGNOSIS — I129 Hypertensive chronic kidney disease with stage 1 through stage 4 chronic kidney disease, or unspecified chronic kidney disease: Secondary | ICD-10-CM | POA: Diagnosis not present

## 2016-07-07 DIAGNOSIS — M6281 Muscle weakness (generalized): Secondary | ICD-10-CM | POA: Diagnosis not present

## 2016-07-07 DIAGNOSIS — Z79899 Other long term (current) drug therapy: Secondary | ICD-10-CM | POA: Diagnosis not present

## 2016-07-07 DIAGNOSIS — M25562 Pain in left knee: Secondary | ICD-10-CM | POA: Diagnosis not present

## 2016-07-07 DIAGNOSIS — Z9181 History of falling: Secondary | ICD-10-CM | POA: Diagnosis not present

## 2016-07-07 DIAGNOSIS — I129 Hypertensive chronic kidney disease with stage 1 through stage 4 chronic kidney disease, or unspecified chronic kidney disease: Secondary | ICD-10-CM | POA: Diagnosis not present

## 2016-07-07 DIAGNOSIS — M069 Rheumatoid arthritis, unspecified: Secondary | ICD-10-CM | POA: Diagnosis not present

## 2016-07-07 DIAGNOSIS — N189 Chronic kidney disease, unspecified: Secondary | ICD-10-CM | POA: Diagnosis not present

## 2016-07-07 DIAGNOSIS — Z7952 Long term (current) use of systemic steroids: Secondary | ICD-10-CM | POA: Diagnosis not present

## 2016-07-07 DIAGNOSIS — M8000XD Age-related osteoporosis with current pathological fracture, unspecified site, subsequent encounter for fracture with routine healing: Secondary | ICD-10-CM | POA: Diagnosis not present

## 2016-07-07 DIAGNOSIS — Z602 Problems related to living alone: Secondary | ICD-10-CM | POA: Diagnosis not present

## 2016-07-08 DIAGNOSIS — Z7952 Long term (current) use of systemic steroids: Secondary | ICD-10-CM | POA: Diagnosis not present

## 2016-07-08 DIAGNOSIS — Z79899 Other long term (current) drug therapy: Secondary | ICD-10-CM | POA: Diagnosis not present

## 2016-07-08 DIAGNOSIS — Z9181 History of falling: Secondary | ICD-10-CM | POA: Diagnosis not present

## 2016-07-08 DIAGNOSIS — M069 Rheumatoid arthritis, unspecified: Secondary | ICD-10-CM | POA: Diagnosis not present

## 2016-07-08 DIAGNOSIS — M8000XD Age-related osteoporosis with current pathological fracture, unspecified site, subsequent encounter for fracture with routine healing: Secondary | ICD-10-CM | POA: Diagnosis not present

## 2016-07-08 DIAGNOSIS — Z602 Problems related to living alone: Secondary | ICD-10-CM | POA: Diagnosis not present

## 2016-07-08 DIAGNOSIS — M25562 Pain in left knee: Secondary | ICD-10-CM | POA: Diagnosis not present

## 2016-07-08 DIAGNOSIS — I129 Hypertensive chronic kidney disease with stage 1 through stage 4 chronic kidney disease, or unspecified chronic kidney disease: Secondary | ICD-10-CM | POA: Diagnosis not present

## 2016-07-08 DIAGNOSIS — M6281 Muscle weakness (generalized): Secondary | ICD-10-CM | POA: Diagnosis not present

## 2016-07-08 DIAGNOSIS — N189 Chronic kidney disease, unspecified: Secondary | ICD-10-CM | POA: Diagnosis not present

## 2016-07-13 DIAGNOSIS — M8000XD Age-related osteoporosis with current pathological fracture, unspecified site, subsequent encounter for fracture with routine healing: Secondary | ICD-10-CM | POA: Diagnosis not present

## 2016-07-13 DIAGNOSIS — M25562 Pain in left knee: Secondary | ICD-10-CM | POA: Diagnosis not present

## 2016-07-13 DIAGNOSIS — N189 Chronic kidney disease, unspecified: Secondary | ICD-10-CM | POA: Diagnosis not present

## 2016-07-13 DIAGNOSIS — Z9181 History of falling: Secondary | ICD-10-CM | POA: Diagnosis not present

## 2016-07-13 DIAGNOSIS — M069 Rheumatoid arthritis, unspecified: Secondary | ICD-10-CM | POA: Diagnosis not present

## 2016-07-13 DIAGNOSIS — Z79899 Other long term (current) drug therapy: Secondary | ICD-10-CM | POA: Diagnosis not present

## 2016-07-13 DIAGNOSIS — Z602 Problems related to living alone: Secondary | ICD-10-CM | POA: Diagnosis not present

## 2016-07-13 DIAGNOSIS — Z7952 Long term (current) use of systemic steroids: Secondary | ICD-10-CM | POA: Diagnosis not present

## 2016-07-13 DIAGNOSIS — I129 Hypertensive chronic kidney disease with stage 1 through stage 4 chronic kidney disease, or unspecified chronic kidney disease: Secondary | ICD-10-CM | POA: Diagnosis not present

## 2016-07-13 DIAGNOSIS — M6281 Muscle weakness (generalized): Secondary | ICD-10-CM | POA: Diagnosis not present

## 2016-07-15 DIAGNOSIS — Z602 Problems related to living alone: Secondary | ICD-10-CM | POA: Diagnosis not present

## 2016-07-15 DIAGNOSIS — M6281 Muscle weakness (generalized): Secondary | ICD-10-CM | POA: Diagnosis not present

## 2016-07-15 DIAGNOSIS — Z9181 History of falling: Secondary | ICD-10-CM | POA: Diagnosis not present

## 2016-07-15 DIAGNOSIS — N189 Chronic kidney disease, unspecified: Secondary | ICD-10-CM | POA: Diagnosis not present

## 2016-07-15 DIAGNOSIS — Z79899 Other long term (current) drug therapy: Secondary | ICD-10-CM | POA: Diagnosis not present

## 2016-07-15 DIAGNOSIS — M25562 Pain in left knee: Secondary | ICD-10-CM | POA: Diagnosis not present

## 2016-07-15 DIAGNOSIS — I129 Hypertensive chronic kidney disease with stage 1 through stage 4 chronic kidney disease, or unspecified chronic kidney disease: Secondary | ICD-10-CM | POA: Diagnosis not present

## 2016-07-15 DIAGNOSIS — M069 Rheumatoid arthritis, unspecified: Secondary | ICD-10-CM | POA: Diagnosis not present

## 2016-07-15 DIAGNOSIS — Z7952 Long term (current) use of systemic steroids: Secondary | ICD-10-CM | POA: Diagnosis not present

## 2016-07-15 DIAGNOSIS — M8000XD Age-related osteoporosis with current pathological fracture, unspecified site, subsequent encounter for fracture with routine healing: Secondary | ICD-10-CM | POA: Diagnosis not present

## 2016-07-21 DIAGNOSIS — Z9181 History of falling: Secondary | ICD-10-CM | POA: Diagnosis not present

## 2016-07-21 DIAGNOSIS — M6281 Muscle weakness (generalized): Secondary | ICD-10-CM | POA: Diagnosis not present

## 2016-07-21 DIAGNOSIS — M069 Rheumatoid arthritis, unspecified: Secondary | ICD-10-CM | POA: Diagnosis not present

## 2016-07-21 DIAGNOSIS — M8000XD Age-related osteoporosis with current pathological fracture, unspecified site, subsequent encounter for fracture with routine healing: Secondary | ICD-10-CM | POA: Diagnosis not present

## 2016-07-21 DIAGNOSIS — N189 Chronic kidney disease, unspecified: Secondary | ICD-10-CM | POA: Diagnosis not present

## 2016-07-21 DIAGNOSIS — Z79899 Other long term (current) drug therapy: Secondary | ICD-10-CM | POA: Diagnosis not present

## 2016-07-21 DIAGNOSIS — Z602 Problems related to living alone: Secondary | ICD-10-CM | POA: Diagnosis not present

## 2016-07-21 DIAGNOSIS — M25562 Pain in left knee: Secondary | ICD-10-CM | POA: Diagnosis not present

## 2016-07-21 DIAGNOSIS — Z7952 Long term (current) use of systemic steroids: Secondary | ICD-10-CM | POA: Diagnosis not present

## 2016-07-21 DIAGNOSIS — I129 Hypertensive chronic kidney disease with stage 1 through stage 4 chronic kidney disease, or unspecified chronic kidney disease: Secondary | ICD-10-CM | POA: Diagnosis not present

## 2016-08-12 DIAGNOSIS — H8113 Benign paroxysmal vertigo, bilateral: Secondary | ICD-10-CM | POA: Diagnosis not present

## 2016-08-12 DIAGNOSIS — Z6821 Body mass index (BMI) 21.0-21.9, adult: Secondary | ICD-10-CM | POA: Diagnosis not present

## 2016-08-13 DIAGNOSIS — R42 Dizziness and giddiness: Secondary | ICD-10-CM | POA: Diagnosis not present

## 2016-08-13 DIAGNOSIS — R404 Transient alteration of awareness: Secondary | ICD-10-CM | POA: Diagnosis not present

## 2016-08-13 DIAGNOSIS — I482 Chronic atrial fibrillation: Secondary | ICD-10-CM | POA: Diagnosis not present

## 2016-08-13 DIAGNOSIS — D649 Anemia, unspecified: Secondary | ICD-10-CM | POA: Diagnosis not present

## 2016-08-13 DIAGNOSIS — R489 Unspecified symbolic dysfunctions: Secondary | ICD-10-CM | POA: Diagnosis not present

## 2016-08-13 DIAGNOSIS — R262 Difficulty in walking, not elsewhere classified: Secondary | ICD-10-CM | POA: Diagnosis not present

## 2016-08-13 DIAGNOSIS — M6281 Muscle weakness (generalized): Secondary | ICD-10-CM | POA: Diagnosis not present

## 2016-08-13 DIAGNOSIS — M810 Age-related osteoporosis without current pathological fracture: Secondary | ICD-10-CM | POA: Diagnosis not present

## 2016-08-13 DIAGNOSIS — M0689 Other specified rheumatoid arthritis, multiple sites: Secondary | ICD-10-CM | POA: Diagnosis not present

## 2016-08-13 DIAGNOSIS — I10 Essential (primary) hypertension: Secondary | ICD-10-CM | POA: Diagnosis not present

## 2016-08-13 DIAGNOSIS — K219 Gastro-esophageal reflux disease without esophagitis: Secondary | ICD-10-CM | POA: Diagnosis not present

## 2016-08-13 DIAGNOSIS — M1990 Unspecified osteoarthritis, unspecified site: Secondary | ICD-10-CM | POA: Diagnosis not present

## 2016-08-13 DIAGNOSIS — N3000 Acute cystitis without hematuria: Secondary | ICD-10-CM | POA: Diagnosis not present

## 2016-08-13 DIAGNOSIS — N3281 Overactive bladder: Secondary | ICD-10-CM | POA: Diagnosis not present

## 2016-08-13 DIAGNOSIS — N390 Urinary tract infection, site not specified: Secondary | ICD-10-CM | POA: Diagnosis not present

## 2016-08-13 DIAGNOSIS — R531 Weakness: Secondary | ICD-10-CM | POA: Diagnosis not present

## 2016-08-25 ENCOUNTER — Other Ambulatory Visit: Payer: Self-pay | Admitting: *Deleted

## 2016-08-25 NOTE — Patient Outreach (Signed)
[facility]) Care Management  08/25/2016  EVERLYN FARABAUGH 29-Mar-1934 [id number]   Met with patient at bedside of facility. Patient states she plans to go home upon discharge. She states she has a friend who will be by daily to assist her with transportation and meals.  She has supportive family close by also.   RNCM reviewed [facility] program. Left [facility] brochure for future reference.  Met with Monico Hoar, RN, discharge planner, she states patient will go home with home care. She will also set up for EMMI transition calls.   Plan to sign off. Royetta Crochet. Laymond Purser, RN, BSN, [place] [phone]) Business Cell  [phone]) Toll Free Office

## 2016-08-31 DIAGNOSIS — M810 Age-related osteoporosis without current pathological fracture: Secondary | ICD-10-CM | POA: Diagnosis not present

## 2016-08-31 DIAGNOSIS — M1990 Unspecified osteoarthritis, unspecified site: Secondary | ICD-10-CM | POA: Diagnosis not present

## 2016-08-31 DIAGNOSIS — R2689 Other abnormalities of gait and mobility: Secondary | ICD-10-CM | POA: Diagnosis not present

## 2016-08-31 DIAGNOSIS — K219 Gastro-esophageal reflux disease without esophagitis: Secondary | ICD-10-CM | POA: Diagnosis not present

## 2016-08-31 DIAGNOSIS — M6281 Muscle weakness (generalized): Secondary | ICD-10-CM | POA: Diagnosis not present

## 2016-08-31 DIAGNOSIS — Z9181 History of falling: Secondary | ICD-10-CM | POA: Diagnosis not present

## 2016-08-31 DIAGNOSIS — I482 Chronic atrial fibrillation: Secondary | ICD-10-CM | POA: Diagnosis not present

## 2016-08-31 DIAGNOSIS — D649 Anemia, unspecified: Secondary | ICD-10-CM | POA: Diagnosis not present

## 2016-08-31 DIAGNOSIS — Z7952 Long term (current) use of systemic steroids: Secondary | ICD-10-CM | POA: Diagnosis not present

## 2016-08-31 DIAGNOSIS — I10 Essential (primary) hypertension: Secondary | ICD-10-CM | POA: Diagnosis not present

## 2016-08-31 DIAGNOSIS — M0689 Other specified rheumatoid arthritis, multiple sites: Secondary | ICD-10-CM | POA: Diagnosis not present

## 2016-08-31 DIAGNOSIS — Z87440 Personal history of urinary (tract) infections: Secondary | ICD-10-CM | POA: Diagnosis not present

## 2016-08-31 DIAGNOSIS — R42 Dizziness and giddiness: Secondary | ICD-10-CM | POA: Diagnosis not present

## 2016-09-01 DIAGNOSIS — M1990 Unspecified osteoarthritis, unspecified site: Secondary | ICD-10-CM | POA: Diagnosis not present

## 2016-09-01 DIAGNOSIS — K219 Gastro-esophageal reflux disease without esophagitis: Secondary | ICD-10-CM | POA: Diagnosis not present

## 2016-09-01 DIAGNOSIS — M810 Age-related osteoporosis without current pathological fracture: Secondary | ICD-10-CM | POA: Diagnosis not present

## 2016-09-01 DIAGNOSIS — M6281 Muscle weakness (generalized): Secondary | ICD-10-CM | POA: Diagnosis not present

## 2016-09-01 DIAGNOSIS — Z87440 Personal history of urinary (tract) infections: Secondary | ICD-10-CM | POA: Diagnosis not present

## 2016-09-01 DIAGNOSIS — I10 Essential (primary) hypertension: Secondary | ICD-10-CM | POA: Diagnosis not present

## 2016-09-01 DIAGNOSIS — M0689 Other specified rheumatoid arthritis, multiple sites: Secondary | ICD-10-CM | POA: Diagnosis not present

## 2016-09-01 DIAGNOSIS — Z7952 Long term (current) use of systemic steroids: Secondary | ICD-10-CM | POA: Diagnosis not present

## 2016-09-01 DIAGNOSIS — I482 Chronic atrial fibrillation: Secondary | ICD-10-CM | POA: Diagnosis not present

## 2016-09-01 DIAGNOSIS — Z9181 History of falling: Secondary | ICD-10-CM | POA: Diagnosis not present

## 2016-09-01 DIAGNOSIS — R2689 Other abnormalities of gait and mobility: Secondary | ICD-10-CM | POA: Diagnosis not present

## 2016-09-01 DIAGNOSIS — D649 Anemia, unspecified: Secondary | ICD-10-CM | POA: Diagnosis not present

## 2016-09-01 DIAGNOSIS — R42 Dizziness and giddiness: Secondary | ICD-10-CM | POA: Diagnosis not present

## 2016-09-03 DIAGNOSIS — H8113 Benign paroxysmal vertigo, bilateral: Secondary | ICD-10-CM | POA: Diagnosis not present

## 2016-09-03 DIAGNOSIS — M1990 Unspecified osteoarthritis, unspecified site: Secondary | ICD-10-CM | POA: Diagnosis not present

## 2016-09-03 DIAGNOSIS — N390 Urinary tract infection, site not specified: Secondary | ICD-10-CM | POA: Diagnosis not present

## 2016-09-03 DIAGNOSIS — I10 Essential (primary) hypertension: Secondary | ICD-10-CM | POA: Diagnosis not present

## 2016-09-03 DIAGNOSIS — D649 Anemia, unspecified: Secondary | ICD-10-CM | POA: Diagnosis not present

## 2016-09-03 DIAGNOSIS — R42 Dizziness and giddiness: Secondary | ICD-10-CM | POA: Diagnosis not present

## 2016-09-03 DIAGNOSIS — M6281 Muscle weakness (generalized): Secondary | ICD-10-CM | POA: Diagnosis not present

## 2016-09-03 DIAGNOSIS — I482 Chronic atrial fibrillation: Secondary | ICD-10-CM | POA: Diagnosis not present

## 2016-09-03 DIAGNOSIS — R2689 Other abnormalities of gait and mobility: Secondary | ICD-10-CM | POA: Diagnosis not present

## 2016-09-03 DIAGNOSIS — M810 Age-related osteoporosis without current pathological fracture: Secondary | ICD-10-CM | POA: Diagnosis not present

## 2016-09-03 DIAGNOSIS — Z9181 History of falling: Secondary | ICD-10-CM | POA: Diagnosis not present

## 2016-09-03 DIAGNOSIS — Z7952 Long term (current) use of systemic steroids: Secondary | ICD-10-CM | POA: Diagnosis not present

## 2016-09-03 DIAGNOSIS — M0689 Other specified rheumatoid arthritis, multiple sites: Secondary | ICD-10-CM | POA: Diagnosis not present

## 2016-09-03 DIAGNOSIS — Z87440 Personal history of urinary (tract) infections: Secondary | ICD-10-CM | POA: Diagnosis not present

## 2016-09-03 DIAGNOSIS — Z139 Encounter for screening, unspecified: Secondary | ICD-10-CM | POA: Diagnosis not present

## 2016-09-03 DIAGNOSIS — K219 Gastro-esophageal reflux disease without esophagitis: Secondary | ICD-10-CM | POA: Diagnosis not present

## 2016-09-03 DIAGNOSIS — M0579 Rheumatoid arthritis with rheumatoid factor of multiple sites without organ or systems involvement: Secondary | ICD-10-CM | POA: Diagnosis not present

## 2016-09-06 DIAGNOSIS — K219 Gastro-esophageal reflux disease without esophagitis: Secondary | ICD-10-CM | POA: Diagnosis not present

## 2016-09-06 DIAGNOSIS — R42 Dizziness and giddiness: Secondary | ICD-10-CM | POA: Diagnosis not present

## 2016-09-06 DIAGNOSIS — M810 Age-related osteoporosis without current pathological fracture: Secondary | ICD-10-CM | POA: Diagnosis not present

## 2016-09-06 DIAGNOSIS — M0689 Other specified rheumatoid arthritis, multiple sites: Secondary | ICD-10-CM | POA: Diagnosis not present

## 2016-09-06 DIAGNOSIS — Z9181 History of falling: Secondary | ICD-10-CM | POA: Diagnosis not present

## 2016-09-06 DIAGNOSIS — I482 Chronic atrial fibrillation: Secondary | ICD-10-CM | POA: Diagnosis not present

## 2016-09-06 DIAGNOSIS — M6281 Muscle weakness (generalized): Secondary | ICD-10-CM | POA: Diagnosis not present

## 2016-09-06 DIAGNOSIS — R2689 Other abnormalities of gait and mobility: Secondary | ICD-10-CM | POA: Diagnosis not present

## 2016-09-06 DIAGNOSIS — I10 Essential (primary) hypertension: Secondary | ICD-10-CM | POA: Diagnosis not present

## 2016-09-06 DIAGNOSIS — Z7952 Long term (current) use of systemic steroids: Secondary | ICD-10-CM | POA: Diagnosis not present

## 2016-09-06 DIAGNOSIS — D649 Anemia, unspecified: Secondary | ICD-10-CM | POA: Diagnosis not present

## 2016-09-06 DIAGNOSIS — Z87440 Personal history of urinary (tract) infections: Secondary | ICD-10-CM | POA: Diagnosis not present

## 2016-09-06 DIAGNOSIS — M1990 Unspecified osteoarthritis, unspecified site: Secondary | ICD-10-CM | POA: Diagnosis not present

## 2016-09-07 DIAGNOSIS — R2689 Other abnormalities of gait and mobility: Secondary | ICD-10-CM | POA: Diagnosis not present

## 2016-09-07 DIAGNOSIS — M659 Synovitis and tenosynovitis, unspecified: Secondary | ICD-10-CM | POA: Diagnosis not present

## 2016-09-07 DIAGNOSIS — M810 Age-related osteoporosis without current pathological fracture: Secondary | ICD-10-CM | POA: Diagnosis not present

## 2016-09-07 DIAGNOSIS — M6281 Muscle weakness (generalized): Secondary | ICD-10-CM | POA: Diagnosis not present

## 2016-09-07 DIAGNOSIS — M1990 Unspecified osteoarthritis, unspecified site: Secondary | ICD-10-CM | POA: Diagnosis not present

## 2016-09-07 DIAGNOSIS — M0579 Rheumatoid arthritis with rheumatoid factor of multiple sites without organ or systems involvement: Secondary | ICD-10-CM | POA: Diagnosis not present

## 2016-09-07 DIAGNOSIS — Z7952 Long term (current) use of systemic steroids: Secondary | ICD-10-CM | POA: Diagnosis not present

## 2016-09-07 DIAGNOSIS — Z6822 Body mass index (BMI) 22.0-22.9, adult: Secondary | ICD-10-CM | POA: Diagnosis not present

## 2016-09-07 DIAGNOSIS — Z9181 History of falling: Secondary | ICD-10-CM | POA: Diagnosis not present

## 2016-09-07 DIAGNOSIS — M0689 Other specified rheumatoid arthritis, multiple sites: Secondary | ICD-10-CM | POA: Diagnosis not present

## 2016-09-07 DIAGNOSIS — I10 Essential (primary) hypertension: Secondary | ICD-10-CM | POA: Diagnosis not present

## 2016-09-07 DIAGNOSIS — R42 Dizziness and giddiness: Secondary | ICD-10-CM | POA: Diagnosis not present

## 2016-09-07 DIAGNOSIS — D649 Anemia, unspecified: Secondary | ICD-10-CM | POA: Diagnosis not present

## 2016-09-07 DIAGNOSIS — I482 Chronic atrial fibrillation: Secondary | ICD-10-CM | POA: Diagnosis not present

## 2016-09-07 DIAGNOSIS — K219 Gastro-esophageal reflux disease without esophagitis: Secondary | ICD-10-CM | POA: Diagnosis not present

## 2016-09-07 DIAGNOSIS — Z87440 Personal history of urinary (tract) infections: Secondary | ICD-10-CM | POA: Diagnosis not present

## 2016-09-08 DIAGNOSIS — M6281 Muscle weakness (generalized): Secondary | ICD-10-CM | POA: Diagnosis not present

## 2016-09-08 DIAGNOSIS — M810 Age-related osteoporosis without current pathological fracture: Secondary | ICD-10-CM | POA: Diagnosis not present

## 2016-09-08 DIAGNOSIS — M0689 Other specified rheumatoid arthritis, multiple sites: Secondary | ICD-10-CM | POA: Diagnosis not present

## 2016-09-08 DIAGNOSIS — I10 Essential (primary) hypertension: Secondary | ICD-10-CM | POA: Diagnosis not present

## 2016-09-08 DIAGNOSIS — I482 Chronic atrial fibrillation: Secondary | ICD-10-CM | POA: Diagnosis not present

## 2016-09-08 DIAGNOSIS — R42 Dizziness and giddiness: Secondary | ICD-10-CM | POA: Diagnosis not present

## 2016-09-08 DIAGNOSIS — D649 Anemia, unspecified: Secondary | ICD-10-CM | POA: Diagnosis not present

## 2016-09-08 DIAGNOSIS — M1990 Unspecified osteoarthritis, unspecified site: Secondary | ICD-10-CM | POA: Diagnosis not present

## 2016-09-08 DIAGNOSIS — Z87440 Personal history of urinary (tract) infections: Secondary | ICD-10-CM | POA: Diagnosis not present

## 2016-09-08 DIAGNOSIS — K219 Gastro-esophageal reflux disease without esophagitis: Secondary | ICD-10-CM | POA: Diagnosis not present

## 2016-09-08 DIAGNOSIS — Z9181 History of falling: Secondary | ICD-10-CM | POA: Diagnosis not present

## 2016-09-08 DIAGNOSIS — Z7952 Long term (current) use of systemic steroids: Secondary | ICD-10-CM | POA: Diagnosis not present

## 2016-09-08 DIAGNOSIS — R2689 Other abnormalities of gait and mobility: Secondary | ICD-10-CM | POA: Diagnosis not present

## 2016-09-09 DIAGNOSIS — I10 Essential (primary) hypertension: Secondary | ICD-10-CM | POA: Diagnosis not present

## 2016-09-09 DIAGNOSIS — R2689 Other abnormalities of gait and mobility: Secondary | ICD-10-CM | POA: Diagnosis not present

## 2016-09-09 DIAGNOSIS — M0689 Other specified rheumatoid arthritis, multiple sites: Secondary | ICD-10-CM | POA: Diagnosis not present

## 2016-09-09 DIAGNOSIS — D649 Anemia, unspecified: Secondary | ICD-10-CM | POA: Diagnosis not present

## 2016-09-09 DIAGNOSIS — M1990 Unspecified osteoarthritis, unspecified site: Secondary | ICD-10-CM | POA: Diagnosis not present

## 2016-09-09 DIAGNOSIS — M810 Age-related osteoporosis without current pathological fracture: Secondary | ICD-10-CM | POA: Diagnosis not present

## 2016-09-09 DIAGNOSIS — Z9181 History of falling: Secondary | ICD-10-CM | POA: Diagnosis not present

## 2016-09-09 DIAGNOSIS — R42 Dizziness and giddiness: Secondary | ICD-10-CM | POA: Diagnosis not present

## 2016-09-09 DIAGNOSIS — K219 Gastro-esophageal reflux disease without esophagitis: Secondary | ICD-10-CM | POA: Diagnosis not present

## 2016-09-09 DIAGNOSIS — M6281 Muscle weakness (generalized): Secondary | ICD-10-CM | POA: Diagnosis not present

## 2016-09-09 DIAGNOSIS — Z7952 Long term (current) use of systemic steroids: Secondary | ICD-10-CM | POA: Diagnosis not present

## 2016-09-09 DIAGNOSIS — Z87440 Personal history of urinary (tract) infections: Secondary | ICD-10-CM | POA: Diagnosis not present

## 2016-09-09 DIAGNOSIS — I482 Chronic atrial fibrillation: Secondary | ICD-10-CM | POA: Diagnosis not present

## 2016-09-10 DIAGNOSIS — Z9181 History of falling: Secondary | ICD-10-CM | POA: Diagnosis not present

## 2016-09-10 DIAGNOSIS — M0689 Other specified rheumatoid arthritis, multiple sites: Secondary | ICD-10-CM | POA: Diagnosis not present

## 2016-09-10 DIAGNOSIS — M6281 Muscle weakness (generalized): Secondary | ICD-10-CM | POA: Diagnosis not present

## 2016-09-10 DIAGNOSIS — K219 Gastro-esophageal reflux disease without esophagitis: Secondary | ICD-10-CM | POA: Diagnosis not present

## 2016-09-10 DIAGNOSIS — I482 Chronic atrial fibrillation: Secondary | ICD-10-CM | POA: Diagnosis not present

## 2016-09-10 DIAGNOSIS — Z87440 Personal history of urinary (tract) infections: Secondary | ICD-10-CM | POA: Diagnosis not present

## 2016-09-10 DIAGNOSIS — D649 Anemia, unspecified: Secondary | ICD-10-CM | POA: Diagnosis not present

## 2016-09-10 DIAGNOSIS — M810 Age-related osteoporosis without current pathological fracture: Secondary | ICD-10-CM | POA: Diagnosis not present

## 2016-09-10 DIAGNOSIS — R42 Dizziness and giddiness: Secondary | ICD-10-CM | POA: Diagnosis not present

## 2016-09-10 DIAGNOSIS — I10 Essential (primary) hypertension: Secondary | ICD-10-CM | POA: Diagnosis not present

## 2016-09-10 DIAGNOSIS — M1990 Unspecified osteoarthritis, unspecified site: Secondary | ICD-10-CM | POA: Diagnosis not present

## 2016-09-10 DIAGNOSIS — R2689 Other abnormalities of gait and mobility: Secondary | ICD-10-CM | POA: Diagnosis not present

## 2016-09-10 DIAGNOSIS — Z7952 Long term (current) use of systemic steroids: Secondary | ICD-10-CM | POA: Diagnosis not present

## 2016-09-14 DIAGNOSIS — M810 Age-related osteoporosis without current pathological fracture: Secondary | ICD-10-CM | POA: Diagnosis not present

## 2016-09-14 DIAGNOSIS — Z9181 History of falling: Secondary | ICD-10-CM | POA: Diagnosis not present

## 2016-09-14 DIAGNOSIS — Z87440 Personal history of urinary (tract) infections: Secondary | ICD-10-CM | POA: Diagnosis not present

## 2016-09-14 DIAGNOSIS — R42 Dizziness and giddiness: Secondary | ICD-10-CM | POA: Diagnosis not present

## 2016-09-14 DIAGNOSIS — I10 Essential (primary) hypertension: Secondary | ICD-10-CM | POA: Diagnosis not present

## 2016-09-14 DIAGNOSIS — Z7952 Long term (current) use of systemic steroids: Secondary | ICD-10-CM | POA: Diagnosis not present

## 2016-09-14 DIAGNOSIS — I482 Chronic atrial fibrillation: Secondary | ICD-10-CM | POA: Diagnosis not present

## 2016-09-14 DIAGNOSIS — M1990 Unspecified osteoarthritis, unspecified site: Secondary | ICD-10-CM | POA: Diagnosis not present

## 2016-09-14 DIAGNOSIS — K219 Gastro-esophageal reflux disease without esophagitis: Secondary | ICD-10-CM | POA: Diagnosis not present

## 2016-09-14 DIAGNOSIS — D649 Anemia, unspecified: Secondary | ICD-10-CM | POA: Diagnosis not present

## 2016-09-14 DIAGNOSIS — M0689 Other specified rheumatoid arthritis, multiple sites: Secondary | ICD-10-CM | POA: Diagnosis not present

## 2016-09-14 DIAGNOSIS — M6281 Muscle weakness (generalized): Secondary | ICD-10-CM | POA: Diagnosis not present

## 2016-09-14 DIAGNOSIS — R2689 Other abnormalities of gait and mobility: Secondary | ICD-10-CM | POA: Diagnosis not present

## 2016-09-15 DIAGNOSIS — Z87440 Personal history of urinary (tract) infections: Secondary | ICD-10-CM | POA: Diagnosis not present

## 2016-09-15 DIAGNOSIS — M810 Age-related osteoporosis without current pathological fracture: Secondary | ICD-10-CM | POA: Diagnosis not present

## 2016-09-15 DIAGNOSIS — Z9181 History of falling: Secondary | ICD-10-CM | POA: Diagnosis not present

## 2016-09-15 DIAGNOSIS — Z7952 Long term (current) use of systemic steroids: Secondary | ICD-10-CM | POA: Diagnosis not present

## 2016-09-15 DIAGNOSIS — D649 Anemia, unspecified: Secondary | ICD-10-CM | POA: Diagnosis not present

## 2016-09-15 DIAGNOSIS — M6281 Muscle weakness (generalized): Secondary | ICD-10-CM | POA: Diagnosis not present

## 2016-09-15 DIAGNOSIS — M0689 Other specified rheumatoid arthritis, multiple sites: Secondary | ICD-10-CM | POA: Diagnosis not present

## 2016-09-15 DIAGNOSIS — I10 Essential (primary) hypertension: Secondary | ICD-10-CM | POA: Diagnosis not present

## 2016-09-15 DIAGNOSIS — R2689 Other abnormalities of gait and mobility: Secondary | ICD-10-CM | POA: Diagnosis not present

## 2016-09-15 DIAGNOSIS — K219 Gastro-esophageal reflux disease without esophagitis: Secondary | ICD-10-CM | POA: Diagnosis not present

## 2016-09-15 DIAGNOSIS — R42 Dizziness and giddiness: Secondary | ICD-10-CM | POA: Diagnosis not present

## 2016-09-15 DIAGNOSIS — I482 Chronic atrial fibrillation: Secondary | ICD-10-CM | POA: Diagnosis not present

## 2016-09-15 DIAGNOSIS — M1990 Unspecified osteoarthritis, unspecified site: Secondary | ICD-10-CM | POA: Diagnosis not present

## 2016-09-16 DIAGNOSIS — D649 Anemia, unspecified: Secondary | ICD-10-CM | POA: Diagnosis not present

## 2016-09-16 DIAGNOSIS — M810 Age-related osteoporosis without current pathological fracture: Secondary | ICD-10-CM | POA: Diagnosis not present

## 2016-09-16 DIAGNOSIS — R42 Dizziness and giddiness: Secondary | ICD-10-CM | POA: Diagnosis not present

## 2016-09-16 DIAGNOSIS — M6281 Muscle weakness (generalized): Secondary | ICD-10-CM | POA: Diagnosis not present

## 2016-09-16 DIAGNOSIS — Z7952 Long term (current) use of systemic steroids: Secondary | ICD-10-CM | POA: Diagnosis not present

## 2016-09-16 DIAGNOSIS — Z87440 Personal history of urinary (tract) infections: Secondary | ICD-10-CM | POA: Diagnosis not present

## 2016-09-16 DIAGNOSIS — Z9181 History of falling: Secondary | ICD-10-CM | POA: Diagnosis not present

## 2016-09-16 DIAGNOSIS — M1990 Unspecified osteoarthritis, unspecified site: Secondary | ICD-10-CM | POA: Diagnosis not present

## 2016-09-16 DIAGNOSIS — I10 Essential (primary) hypertension: Secondary | ICD-10-CM | POA: Diagnosis not present

## 2016-09-16 DIAGNOSIS — R2689 Other abnormalities of gait and mobility: Secondary | ICD-10-CM | POA: Diagnosis not present

## 2016-09-16 DIAGNOSIS — M0689 Other specified rheumatoid arthritis, multiple sites: Secondary | ICD-10-CM | POA: Diagnosis not present

## 2016-09-16 DIAGNOSIS — I482 Chronic atrial fibrillation: Secondary | ICD-10-CM | POA: Diagnosis not present

## 2016-09-16 DIAGNOSIS — K219 Gastro-esophageal reflux disease without esophagitis: Secondary | ICD-10-CM | POA: Diagnosis not present

## 2016-09-17 DIAGNOSIS — M1990 Unspecified osteoarthritis, unspecified site: Secondary | ICD-10-CM | POA: Diagnosis not present

## 2016-09-17 DIAGNOSIS — M0689 Other specified rheumatoid arthritis, multiple sites: Secondary | ICD-10-CM | POA: Diagnosis not present

## 2016-09-17 DIAGNOSIS — Z9181 History of falling: Secondary | ICD-10-CM | POA: Diagnosis not present

## 2016-09-17 DIAGNOSIS — D649 Anemia, unspecified: Secondary | ICD-10-CM | POA: Diagnosis not present

## 2016-09-17 DIAGNOSIS — Z7952 Long term (current) use of systemic steroids: Secondary | ICD-10-CM | POA: Diagnosis not present

## 2016-09-17 DIAGNOSIS — M6281 Muscle weakness (generalized): Secondary | ICD-10-CM | POA: Diagnosis not present

## 2016-09-17 DIAGNOSIS — Z87440 Personal history of urinary (tract) infections: Secondary | ICD-10-CM | POA: Diagnosis not present

## 2016-09-17 DIAGNOSIS — R42 Dizziness and giddiness: Secondary | ICD-10-CM | POA: Diagnosis not present

## 2016-09-17 DIAGNOSIS — R2689 Other abnormalities of gait and mobility: Secondary | ICD-10-CM | POA: Diagnosis not present

## 2016-09-17 DIAGNOSIS — M810 Age-related osteoporosis without current pathological fracture: Secondary | ICD-10-CM | POA: Diagnosis not present

## 2016-09-17 DIAGNOSIS — I10 Essential (primary) hypertension: Secondary | ICD-10-CM | POA: Diagnosis not present

## 2016-09-17 DIAGNOSIS — K219 Gastro-esophageal reflux disease without esophagitis: Secondary | ICD-10-CM | POA: Diagnosis not present

## 2016-09-17 DIAGNOSIS — I482 Chronic atrial fibrillation: Secondary | ICD-10-CM | POA: Diagnosis not present

## 2016-09-20 DIAGNOSIS — M6281 Muscle weakness (generalized): Secondary | ICD-10-CM | POA: Diagnosis not present

## 2016-09-20 DIAGNOSIS — R42 Dizziness and giddiness: Secondary | ICD-10-CM | POA: Diagnosis not present

## 2016-09-20 DIAGNOSIS — M0689 Other specified rheumatoid arthritis, multiple sites: Secondary | ICD-10-CM | POA: Diagnosis not present

## 2016-09-20 DIAGNOSIS — R2689 Other abnormalities of gait and mobility: Secondary | ICD-10-CM | POA: Diagnosis not present

## 2016-09-20 DIAGNOSIS — Z9181 History of falling: Secondary | ICD-10-CM | POA: Diagnosis not present

## 2016-09-20 DIAGNOSIS — I482 Chronic atrial fibrillation: Secondary | ICD-10-CM | POA: Diagnosis not present

## 2016-09-20 DIAGNOSIS — D649 Anemia, unspecified: Secondary | ICD-10-CM | POA: Diagnosis not present

## 2016-09-20 DIAGNOSIS — I10 Essential (primary) hypertension: Secondary | ICD-10-CM | POA: Diagnosis not present

## 2016-09-20 DIAGNOSIS — M810 Age-related osteoporosis without current pathological fracture: Secondary | ICD-10-CM | POA: Diagnosis not present

## 2016-09-20 DIAGNOSIS — K219 Gastro-esophageal reflux disease without esophagitis: Secondary | ICD-10-CM | POA: Diagnosis not present

## 2016-09-20 DIAGNOSIS — Z7952 Long term (current) use of systemic steroids: Secondary | ICD-10-CM | POA: Diagnosis not present

## 2016-09-20 DIAGNOSIS — M1990 Unspecified osteoarthritis, unspecified site: Secondary | ICD-10-CM | POA: Diagnosis not present

## 2016-09-20 DIAGNOSIS — Z87440 Personal history of urinary (tract) infections: Secondary | ICD-10-CM | POA: Diagnosis not present

## 2016-09-22 DIAGNOSIS — Z7952 Long term (current) use of systemic steroids: Secondary | ICD-10-CM | POA: Diagnosis not present

## 2016-09-22 DIAGNOSIS — D649 Anemia, unspecified: Secondary | ICD-10-CM | POA: Diagnosis not present

## 2016-09-22 DIAGNOSIS — K219 Gastro-esophageal reflux disease without esophagitis: Secondary | ICD-10-CM | POA: Diagnosis not present

## 2016-09-22 DIAGNOSIS — R42 Dizziness and giddiness: Secondary | ICD-10-CM | POA: Diagnosis not present

## 2016-09-22 DIAGNOSIS — M6281 Muscle weakness (generalized): Secondary | ICD-10-CM | POA: Diagnosis not present

## 2016-09-22 DIAGNOSIS — M1990 Unspecified osteoarthritis, unspecified site: Secondary | ICD-10-CM | POA: Diagnosis not present

## 2016-09-22 DIAGNOSIS — R2689 Other abnormalities of gait and mobility: Secondary | ICD-10-CM | POA: Diagnosis not present

## 2016-09-22 DIAGNOSIS — M810 Age-related osteoporosis without current pathological fracture: Secondary | ICD-10-CM | POA: Diagnosis not present

## 2016-09-22 DIAGNOSIS — M0689 Other specified rheumatoid arthritis, multiple sites: Secondary | ICD-10-CM | POA: Diagnosis not present

## 2016-09-22 DIAGNOSIS — I10 Essential (primary) hypertension: Secondary | ICD-10-CM | POA: Diagnosis not present

## 2016-09-22 DIAGNOSIS — I482 Chronic atrial fibrillation: Secondary | ICD-10-CM | POA: Diagnosis not present

## 2016-09-22 DIAGNOSIS — Z87440 Personal history of urinary (tract) infections: Secondary | ICD-10-CM | POA: Diagnosis not present

## 2016-09-22 DIAGNOSIS — Z9181 History of falling: Secondary | ICD-10-CM | POA: Diagnosis not present

## 2016-10-28 ENCOUNTER — Emergency Department (HOSPITAL_COMMUNITY): Payer: Medicare Other

## 2016-10-28 ENCOUNTER — Inpatient Hospital Stay (HOSPITAL_COMMUNITY)
Admission: EM | Admit: 2016-10-28 | Discharge: 2016-11-04 | DRG: 871 | Disposition: A | Discharge status: Skilled Nursing Facility | Payer: Medicare Other | Attending: Internal Medicine | Admitting: Internal Medicine

## 2016-10-28 ENCOUNTER — Encounter (HOSPITAL_COMMUNITY): Payer: Self-pay | Admitting: Radiology

## 2016-10-28 DIAGNOSIS — N3281 Overactive bladder: Secondary | ICD-10-CM | POA: Diagnosis not present

## 2016-10-28 DIAGNOSIS — B379 Candidiasis, unspecified: Secondary | ICD-10-CM | POA: Diagnosis present

## 2016-10-28 DIAGNOSIS — M069 Rheumatoid arthritis, unspecified: Secondary | ICD-10-CM | POA: Diagnosis present

## 2016-10-28 DIAGNOSIS — I5031 Acute diastolic (congestive) heart failure: Secondary | ICD-10-CM | POA: Diagnosis not present

## 2016-10-28 DIAGNOSIS — I10 Essential (primary) hypertension: Secondary | ICD-10-CM | POA: Diagnosis not present

## 2016-10-28 DIAGNOSIS — Z96651 Presence of right artificial knee joint: Secondary | ICD-10-CM | POA: Diagnosis present

## 2016-10-28 DIAGNOSIS — I4891 Unspecified atrial fibrillation: Secondary | ICD-10-CM | POA: Diagnosis not present

## 2016-10-28 DIAGNOSIS — R4182 Altered mental status, unspecified: Secondary | ICD-10-CM | POA: Diagnosis not present

## 2016-10-28 DIAGNOSIS — N3000 Acute cystitis without hematuria: Secondary | ICD-10-CM | POA: Diagnosis not present

## 2016-10-28 DIAGNOSIS — A419 Sepsis, unspecified organism: Principal | ICD-10-CM | POA: Diagnosis present

## 2016-10-28 DIAGNOSIS — Z79899 Other long term (current) drug therapy: Secondary | ICD-10-CM | POA: Diagnosis not present

## 2016-10-28 DIAGNOSIS — R6520 Severe sepsis without septic shock: Secondary | ICD-10-CM | POA: Diagnosis not present

## 2016-10-28 DIAGNOSIS — N3001 Acute cystitis with hematuria: Secondary | ICD-10-CM | POA: Diagnosis not present

## 2016-10-28 DIAGNOSIS — R262 Difficulty in walking, not elsewhere classified: Secondary | ICD-10-CM | POA: Diagnosis not present

## 2016-10-28 DIAGNOSIS — R0682 Tachypnea, not elsewhere classified: Secondary | ICD-10-CM | POA: Diagnosis not present

## 2016-10-28 DIAGNOSIS — N390 Urinary tract infection, site not specified: Secondary | ICD-10-CM | POA: Diagnosis not present

## 2016-10-28 DIAGNOSIS — R109 Unspecified abdominal pain: Secondary | ICD-10-CM | POA: Diagnosis not present

## 2016-10-28 DIAGNOSIS — E8809 Other disorders of plasma-protein metabolism, not elsewhere classified: Secondary | ICD-10-CM | POA: Diagnosis present

## 2016-10-28 DIAGNOSIS — I499 Cardiac arrhythmia, unspecified: Secondary | ICD-10-CM | POA: Diagnosis not present

## 2016-10-28 DIAGNOSIS — N179 Acute kidney failure, unspecified: Secondary | ICD-10-CM | POA: Diagnosis not present

## 2016-10-28 DIAGNOSIS — Z87440 Personal history of urinary (tract) infections: Secondary | ICD-10-CM

## 2016-10-28 DIAGNOSIS — I21A1 Myocardial infarction type 2: Secondary | ICD-10-CM | POA: Diagnosis not present

## 2016-10-28 DIAGNOSIS — M6282 Rhabdomyolysis: Secondary | ICD-10-CM | POA: Diagnosis not present

## 2016-10-28 DIAGNOSIS — M6281 Muscle weakness (generalized): Secondary | ICD-10-CM | POA: Diagnosis not present

## 2016-10-28 DIAGNOSIS — G9340 Encephalopathy, unspecified: Secondary | ICD-10-CM | POA: Diagnosis not present

## 2016-10-28 DIAGNOSIS — R0902 Hypoxemia: Secondary | ICD-10-CM | POA: Diagnosis not present

## 2016-10-28 DIAGNOSIS — K5900 Constipation, unspecified: Secondary | ICD-10-CM | POA: Diagnosis not present

## 2016-10-28 DIAGNOSIS — E876 Hypokalemia: Secondary | ICD-10-CM | POA: Diagnosis present

## 2016-10-28 DIAGNOSIS — R410 Disorientation, unspecified: Secondary | ICD-10-CM

## 2016-10-28 DIAGNOSIS — J9621 Acute and chronic respiratory failure with hypoxia: Secondary | ICD-10-CM | POA: Diagnosis not present

## 2016-10-28 DIAGNOSIS — R489 Unspecified symbolic dysfunctions: Secondary | ICD-10-CM | POA: Diagnosis not present

## 2016-10-28 DIAGNOSIS — I480 Paroxysmal atrial fibrillation: Secondary | ICD-10-CM | POA: Diagnosis not present

## 2016-10-28 DIAGNOSIS — G9341 Metabolic encephalopathy: Secondary | ICD-10-CM | POA: Diagnosis not present

## 2016-10-28 DIAGNOSIS — T799XXS Unspecified early complication of trauma, sequela: Secondary | ICD-10-CM | POA: Diagnosis not present

## 2016-10-28 DIAGNOSIS — I5033 Acute on chronic diastolic (congestive) heart failure: Secondary | ICD-10-CM | POA: Diagnosis not present

## 2016-10-28 DIAGNOSIS — M79609 Pain in unspecified limb: Secondary | ICD-10-CM | POA: Diagnosis not present

## 2016-10-28 DIAGNOSIS — E871 Hypo-osmolality and hyponatremia: Secondary | ICD-10-CM | POA: Diagnosis present

## 2016-10-28 DIAGNOSIS — M7989 Other specified soft tissue disorders: Secondary | ICD-10-CM | POA: Diagnosis not present

## 2016-10-28 DIAGNOSIS — R52 Pain, unspecified: Secondary | ICD-10-CM

## 2016-10-28 DIAGNOSIS — G92 Toxic encephalopathy: Secondary | ICD-10-CM | POA: Diagnosis not present

## 2016-10-28 DIAGNOSIS — T796XXS Traumatic ischemia of muscle, sequela: Secondary | ICD-10-CM | POA: Diagnosis not present

## 2016-10-28 DIAGNOSIS — J9691 Respiratory failure, unspecified with hypoxia: Secondary | ICD-10-CM | POA: Diagnosis not present

## 2016-10-28 DIAGNOSIS — R6521 Severe sepsis with septic shock: Secondary | ICD-10-CM | POA: Diagnosis present

## 2016-10-28 DIAGNOSIS — Z66 Do not resuscitate: Secondary | ICD-10-CM | POA: Diagnosis present

## 2016-10-28 DIAGNOSIS — B370 Candidal stomatitis: Secondary | ICD-10-CM | POA: Diagnosis not present

## 2016-10-28 DIAGNOSIS — M79604 Pain in right leg: Secondary | ICD-10-CM | POA: Diagnosis not present

## 2016-10-28 DIAGNOSIS — I110 Hypertensive heart disease with heart failure: Secondary | ICD-10-CM | POA: Diagnosis present

## 2016-10-28 LAB — COMPREHENSIVE METABOLIC PANEL
ALK PHOS: 81 U/L (ref 38–126)
ALT: 38 U/L (ref 14–54)
ANION GAP: 13 (ref 5–15)
AST: 55 U/L — ABNORMAL HIGH (ref 15–41)
Albumin: 2.8 g/dL — ABNORMAL LOW (ref 3.5–5.0)
BUN: 36 mg/dL — ABNORMAL HIGH (ref 6–20)
CALCIUM: 7.9 mg/dL — AB (ref 8.9–10.3)
CHLORIDE: 103 mmol/L (ref 101–111)
CO2: 18 mmol/L — AB (ref 22–32)
Creatinine, Ser: 1.23 mg/dL — ABNORMAL HIGH (ref 0.44–1.00)
GFR calc non Af Amer: 40 mL/min — ABNORMAL LOW (ref >60)
GFR, EST AFRICAN AMERICAN: 47 mL/min — AB (ref >60)
Glucose, Bld: 84 mg/dL (ref 65–99)
Potassium: 4 mmol/L (ref 3.5–5.1)
SODIUM: 134 mmol/L — AB (ref 135–145)
Total Bilirubin: 1.8 mg/dL — ABNORMAL HIGH (ref 0.3–1.2)
Total Protein: 6.2 g/dL — ABNORMAL LOW (ref 6.5–8.1)

## 2016-10-28 LAB — CBC WITH DIFFERENTIAL/PLATELET
BASOS ABS: 0 10*3/uL (ref 0.0–0.1)
Basophils Relative: 0 %
EOS PCT: 0 %
Eosinophils Absolute: 0 10*3/uL (ref 0.0–0.7)
HCT: 43.8 % (ref 36.0–46.0)
Hemoglobin: 14.6 g/dL (ref 12.0–15.0)
Lymphocytes Relative: 6 %
Lymphs Abs: 1 10*3/uL (ref 0.7–4.0)
MCH: 30.3 pg (ref 26.0–34.0)
MCHC: 33.3 g/dL (ref 30.0–36.0)
MCV: 90.9 fL (ref 78.0–100.0)
MONO ABS: 1.9 10*3/uL — AB (ref 0.1–1.0)
Monocytes Relative: 11 %
NEUTROS PCT: 83 %
Neutro Abs: 14.3 10*3/uL — ABNORMAL HIGH (ref 1.7–7.7)
PLATELETS: 218 10*3/uL (ref 150–400)
RBC: 4.82 MIL/uL (ref 3.87–5.11)
RDW: 18.3 % — ABNORMAL HIGH (ref 11.5–15.5)
WBC: 17.2 10*3/uL — AB (ref 4.0–10.5)

## 2016-10-28 LAB — URINALYSIS, ROUTINE W REFLEX MICROSCOPIC
Bilirubin Urine: NEGATIVE
GLUCOSE, UA: NEGATIVE mg/dL
Ketones, ur: 80 mg/dL — AB
NITRITE: POSITIVE — AB
PH: 5 (ref 5.0–8.0)
Protein, ur: 30 mg/dL — AB
SPECIFIC GRAVITY, URINE: 1.017 (ref 1.005–1.030)

## 2016-10-28 LAB — PROTIME-INR
INR: 1.73
PROTHROMBIN TIME: 20.5 s — AB (ref 11.4–15.2)

## 2016-10-28 LAB — I-STAT CG4 LACTIC ACID, ED
Lactic Acid, Venous: 3.93 mmol/L (ref 0.5–1.9)
Lactic Acid, Venous: 3.8 mmol/L (ref 0.5–1.9)

## 2016-10-28 LAB — I-STAT TROPONIN, ED
TROPONIN I, POC: 0.06 ng/mL (ref 0.00–0.08)
Troponin i, poc: 0.1 ng/mL (ref 0.00–0.08)

## 2016-10-28 LAB — MAGNESIUM: MAGNESIUM: 1.7 mg/dL (ref 1.7–2.4)

## 2016-10-28 LAB — CK: CK TOTAL: 743 U/L — AB (ref 38–234)

## 2016-10-28 LAB — LIPASE, BLOOD: Lipase: 27 U/L (ref 11–51)

## 2016-10-28 MED ORDER — HEPARIN SODIUM (PORCINE) 5000 UNIT/ML IJ SOLN: 5000.0000 [IU] | Freq: Three times a day (TID) | INTRAMUSCULAR | Status: DC

## 2016-10-28 MED ORDER — IOPAMIDOL (ISOVUE-300) INJECTION 61%
INTRAVENOUS | Status: AC
  Administered 2016-10-28: 100 mL
  Filled 2016-10-28: qty 100

## 2016-10-28 MED ORDER — PIPERACILLIN-TAZOBACTAM 3.375 G IVPB 30 MIN
3.3750 g | Freq: Once | INTRAVENOUS | Status: AC
  Administered 2016-10-28: 3.375 g via INTRAVENOUS
  Filled 2016-10-28: qty 50

## 2016-10-28 MED ORDER — LACTATED RINGERS IV BOLUS (SEPSIS)
30.0000 mL/kg | Freq: Once | INTRAVENOUS | Status: AC
  Administered 2016-10-28: 1632 mL via INTRAVENOUS

## 2016-10-28 MED ORDER — VANCOMYCIN HCL IN DEXTROSE 1-5 GM/200ML-% IV SOLN
1000.0000 mg | Freq: Once | INTRAVENOUS | Status: AC
  Administered 2016-10-28: 1000 mg via INTRAVENOUS
  Filled 2016-10-28: qty 200

## 2016-10-28 MED ORDER — ACETAMINOPHEN 650 MG RE SUPP
650.0000 mg | Freq: Once | RECTAL | Status: AC
  Administered 2016-10-28: 650 mg via RECTAL
  Filled 2016-10-28: qty 1

## 2016-10-28 MED ORDER — LACTATED RINGERS IV BOLUS (SEPSIS)
1000.0000 mL | Freq: Once | INTRAVENOUS | Status: AC
  Administered 2016-10-28: 1000 mL via INTRAVENOUS

## 2016-10-28 MED ORDER — PANTOPRAZOLE SODIUM 40 MG IV SOLR
40.0000 mg | Freq: Every day | INTRAVENOUS | Status: DC
  Administered 2016-10-28 – 2016-10-31 (×4): 40 mg via INTRAVENOUS
  Filled 2016-10-28 (×4): qty 40

## 2016-10-28 MED ORDER — VANCOMYCIN HCL 500 MG IV SOLR
500.0000 mg | INTRAVENOUS | Status: DC
  Administered 2016-10-29: 500 mg via INTRAVENOUS
  Filled 2016-10-28 (×3): qty 500

## 2016-10-28 MED ORDER — HEPARIN (PORCINE) IN NACL 100-0.45 UNIT/ML-% IJ SOLN
1350.0000 [IU]/h | INTRAMUSCULAR | Status: DC
  Administered 2016-10-28: 750 [IU]/h via INTRAVENOUS
  Administered 2016-10-30: 1100 [IU]/h via INTRAVENOUS
  Administered 2016-10-31: 1250 [IU]/h via INTRAVENOUS
  Filled 2016-10-28 (×3): qty 250

## 2016-10-28 MED ORDER — SODIUM CHLORIDE 0.9 % IV SOLN
250.0000 mL | INTRAVENOUS | Status: DC | PRN
  Administered 2016-10-28: 250 mL via INTRAVENOUS

## 2016-10-28 MED ORDER — PIPERACILLIN-TAZOBACTAM 3.375 G IVPB
3.3750 g | Freq: Three times a day (TID) | INTRAVENOUS | Status: DC
  Administered 2016-10-28 – 2016-10-29 (×2): 3.375 g via INTRAVENOUS
  Filled 2016-10-28 (×4): qty 50

## 2016-10-28 MED ORDER — SODIUM CHLORIDE 0.9 % IV BOLUS (SEPSIS)
1000.0000 mL | Freq: Once | INTRAVENOUS | Status: AC
  Administered 2016-10-28: 1000 mL via INTRAVENOUS

## 2016-10-28 NOTE — H&P (Signed)
PULMONARY / CRITICAL CARE MEDICINE   Name: Haley Tucker MRN: [id number] DOB: [birth year]    ADMISSION DATE:  10/28/2016 CONSULTATION DATE:  6/28  REFERRING MD:  Dr. Rubin Payor EDP  CHIEF COMPLAINT:  AMS  HISTORY OF PRESENT ILLNESS:   83 year old female with PMH as below, which is significant for Rheumatoid arthritis, Afib, HTN, and recurrent urinary tract infections. She presented to [facility] ED 6/28 after family had not heard from her for two days. Reportedly she lives at home alone and when family had not heard from her they called EMS. She was last seen Monday 6/25 and decribed feeling weak, but had no specific complaints. Upon EMS arrival she did not answer the door. She was found down and had lost control of her bladder and bowels. She was noted to be altered and only oriented to self, which family reports is off of her baseline.   Upon arrival to the ED she was found to be hypotensive, hypoxemic, and in atrial fibrillation with RVR. She was given about 3 L of IVF resuscitation with conversion to NSR, but remained borderline hypotensive. Hypoxia improved with 2L of nasal cannula oxygen.  Laboratory evaluation significant for serum creatinine 1.23, CO2 18, Ca 7.9, albumin 2.8, WBC 17.2, CK 743, and lactic acid 3.93. Lactic acid only cleared to 3.8 after IVF. UA with many bacteria and nitrite positive. She was given antibiotic therapy with Zosyn and Vancomycin. PCCM asked to see for admission.   PAST MEDICAL HISTORY :  She  has no past medical history on file.  PAST SURGICAL HISTORY: She  has no past surgical history on file.  Not on File  No current facility-administered medications on file prior to encounter.    No current outpatient prescriptions on file prior to encounter.    FAMILY HISTORY:  Her has no family status information on file.    SOCIAL HISTORY: She    REVIEW OF SYSTEMS:  Limited due to encephalopathy Bolds are positive  Constitutional: weight loss, gain,  night sweats, Fevers, chills, fatigue .  HEENT: headaches, Sore throat, sneezing, nasal congestion, post nasal drip, Difficulty swallowing, Tooth/dental problems, visual complaints visual changes, ear ache CV:  chest pain, radiates:,Orthopnea, PND, swelling in lower extremities, dizziness, palpitations, syncope.  GI  heartburn, indigestion, abdominal pain, nausea, vomiting, diarrhea, change in bowel habits, loss of appetite, bloody stools.  Resp: cough, productive:, hemoptysis, dyspnea, chest pain, pleuritic.  Skin: rash or itching or icterus GU: dysuria, change in color of urine, urgency or frequency. flank pain, hematuria  MS: joint pain or swelling. Decreased range of motion  Psych: change in mood or affect. depression or anxiety.  Neuro: difficulty with speech, weakness, numbness, ataxia    SUBJECTIVE:  "Tired" no other specific complaints at this time.   VITAL SIGNS: BP 95/79   Pulse (!) 127   Temp (!) 101.7 F (38.7 C) (Rectal)   Resp (!) 24   Ht 5\' 2"  (1.575 m)   Wt 54.4 kg (120 lb)   SpO2 100%   BMI 21.95 kg/m   HEMODYNAMICS:    VENTILATOR SETTINGS:    INTAKE / OUTPUT: I/O last 3 completed shifts: In: 750 [I.V.:500; IV Piggyback:250] Out: 125 [Urine:125]  PHYSICAL EXAMINATION: General:  Frail elderly female in NAD Neuro: Drowsy, but easily arouses to verbal. Responds appropriately and follows commands.  HEENT:  [place]/AT, PERRL, no JVD. MMM  Cardiovascular:  RRR, no MRG Lungs:  Respirations even, unlabored. Clear bilateral breath sounds Abdomen:  Soft,  diffuse guarding of abdomen primarily RUQ. Normoactive bowel sounds.  Musculoskeletal:  No acute deformity Skin:  Grossly intact. Vertical incision anterior neck.  LABS:  BMET  Recent Labs Lab 10/28/16 1655  NA 134*  K 4.0  CL 103  CO2 18*  BUN 36*  CREATININE 1.23*  GLUCOSE 84    Electrolytes  Recent Labs Lab 10/28/16 1655  CALCIUM 7.9*    CBC  Recent Labs Lab 10/28/16 1655  WBC 17.2*   HGB 14.6  HCT 43.8  PLT 218    Coag's  Recent Labs Lab 10/28/16 1655  INR 1.73    Sepsis Markers  Recent Labs Lab 10/28/16 1710 10/28/16 1940  LATICACIDVEN 3.93* 3.80*    ABG No results for input(s): PHART, PCO2ART, PO2ART in the last 168 hours.  Liver Enzymes  Recent Labs Lab 10/28/16 1655  AST 55*  ALT 38  ALKPHOS 81  BILITOT 1.8*  ALBUMIN 2.8*    Cardiac Enzymes No results for input(s): TROPONINI, PROBNP in the last 168 hours.  Glucose No results for input(s): GLUCAP in the last 168 hours.  Imaging Dg Chest 2 View  Result Date: 10/28/2016 CLINICAL DATA:  Possible sepsis. EXAM: CHEST  2 VIEW COMPARISON:  None. FINDINGS: The lungs are clear without focal pneumonia, edema, pneumothorax or pleural effusion. Cardiopericardial silhouette is at upper limits of normal for size. The visualized bony structures of the thorax are intact. Telemetry leads overlie the chest. IMPRESSION: No active cardiopulmonary disease. Electronically Signed   By: Kennith Center M.D.   On: 10/28/2016 17:42   Dg Knee 2 Views Right  Result Date: 10/28/2016 CLINICAL DATA:  83 year old female with possible sepsis. Right knee swelling and warmth. EXAM: RIGHT KNEE - 1-2 VIEW COMPARISON:  [facility] right femur series 12/30/15 and earlier FINDINGS: Partially visible intramedullary rod in the distal right femur with distal interlocking cortical screw. This hardware appears stable since 2017. No loosening. Superimposed right total knee arthroplasty. The total-knee hardware also appears stable and intact. No definite right knee joint effusion. Osteopenia. No cortical osteolysis identified about the right knee. No acute fracture. No subcutaneous gas. IMPRESSION: Stable radiographic appearance of the distal right femur and right total knee orthopedic hardware since 2017. No acute osseous abnormality identified. Electronically Signed   By: Odessa Fleming M.D.   On: 10/28/2016 17:57   Ct Head Wo  Contrast  Result Date: 10/28/2016 CLINICAL DATA:  83 year old female with altered mental status. EXAM: CT HEAD WITHOUT CONTRAST TECHNIQUE: Contiguous axial images were obtained from the base of the skull through the vertex without intravenous contrast. COMPARISON:  01/22/2016 and earlier. FINDINGS: Brain: Stable cerebral volume. No midline shift, ventriculomegaly, mass effect, evidence of mass lesion, intracranial hemorrhage or evidence of cortically based acute infarction. Patchy bilateral white matter hypodensity is stable since 2017. No cortical encephalomalacia. Vascular: Calcified atherosclerosis at the skull base. No suspicious intracranial vascular hyperdensity. Skull: Stable.  No acute osseous abnormality identified. Sinuses/Orbits: Stable.  Chronic right maxillary opacification. Other: Stable orbit and scalp soft tissues. IMPRESSION: No acute intracranial abnormality. Stable non contrast CT appearance of the brain since 2017. Electronically Signed   By: Odessa Fleming M.D.   On: 10/28/2016 18:00   Ct Abdomen Pelvis W Contrast  Result Date: 10/28/2016 CLINICAL DATA:  Diffuse abdominal pain with fever EXAM: CT ABDOMEN AND PELVIS WITH CONTRAST TECHNIQUE: Multidetector CT imaging of the abdomen and pelvis was performed using the standard protocol following bolus administration of intravenous contrast. CONTRAST:  ISOVUE-300 IOPAMIDOL (ISOVUE-300)  INJECTION 61% COMPARISON:  05/31/2016 FINDINGS: Lower chest: Lung bases demonstrate patchy dependent atelectasis. No pleural effusion or consolidation. Coronary artery calcification. Trace pericardial effusion or thickening. Hepatobiliary: No focal liver abnormality is seen. Status post cholecystectomy. No biliary dilatation. Pancreas: Unremarkable. No pancreatic ductal dilatation or surrounding inflammatory changes. Spleen: Normal in size without focal abnormality. Adrenals/Urinary Tract: Adrenal glands are within normal limits. Subcentimeter hypodense lesions  within the kidneys, too small to further characterize. Bladder unremarkable. Stomach/Bowel: Stomach is nonenlarged. No dilated small bowel. Appendix not well seen but no right lower quadrant inflammation is identified. Colon diverticula without acute inflammation. Moderate to large amount of stool in the colon with mild to moderate formed stools in the rectum. Vascular/Lymphatic: Aortic atherosclerosis. No enlarged abdominal or pelvic lymph nodes. Reproductive: Status post hysterectomy. No adnexal masses. Other: No free air or free fluid Musculoskeletal: Status post right femoral rodding. Old fracture deformities of the right inferior and superior pubic rami knee. Heterogenous lucency in the sacrum unchanged. No acute osseous abnormality. Multiple old left-sided rib fractures. IMPRESSION: 1. No CT evidence for acute intra-abdominal or pelvic pathology. Moderate to large volume of stool in the colon 2. Old fracture deformities of the right pubic rami. Multiple old left-sided rib fractures. 3. Diverticular disease of the colon but without acute inflammation 4. Subcentimeter hypodense lesions in the kidneys, too small to further characterize. Electronically Signed   By: Jasmine Pang M.D.   On: 10/28/2016 19:22   STUDIES:  CT head 6/28 > No acute abnormality CT abd/pelvis 6/28 > no acute abnormality  CULTURES: Blood 6/28 > Urine 6/28 >  ANTIBIOTICS: Zosyn 6/28 >  Vancomycin 6/28 >  SIGNIFICANT EVENTS: 6/28 admit for severe sepsis   LINES/TUBES:   DISCUSSION: 83 year old female presenting with altered mental status after family not being able to contact her for 2 days. Found down in urine and stool. Hypotensive in AF RVR in ED. Improved with volume. Admit for severe sepsis, Afib 6/28. Likely source UTI.  ASSESSMENT / PLAN:  PULMONARY A: Hypoxia:  SpO2 improved with 2L [place]  P:   Supplemental O2 as indicated to keep SpO2 > 92% DNI  CARDIOVASCULAR A:  Atrial fibrillation: rate controlled  s/p IV hydration Hypotension: multifactorial in the setting of severe sepsis, hypovolemia, and a-fib Troponin elevation: suspect demand ischemia  P:  Telemetry monitoring Continue gentle IVF MAP goal > Trend lactic acid Cycle troponin Checking cortisol Heparin infusion per pharmacy protocol DNR if arrests Holding home metoprolol/HCTZ  RENAL A:   Renal failure: uncertain acuity. Suspect acute in the setting of rhabdomyolysis and pre-renal azotemia. Hypocalcemia:  Corrects to 8.9 Anion gap acidosis:  Lactic, urine ketones elevated  P:   Follow BMP Gentle hydration Strict I&O, place foley  GASTROINTESTINAL A:   Abdominal pain/guarding CT non-acute Transaminitis  P:   NPO Protonix for SUP Repeat LFT in AM  HEMATOLOGIC A:   No acute issues  P:  Follow CBC  INFECTIOUS A:   Severe sepsis: Source not entire certain, suspect acute cystitis  P:   Antibiotics as above Follow WBC and fever curve  ENDOCRINE A:   Rheumatoid arthritis   P:     NEUROLOGIC A:   Acute metabolic encephalopathy  P:   RASS goal: 0 Monitor   FAMILY  - Updates: Nephew Carlean Purl updated via phone 6/28 PM. He confirms patients wishes for DNR, DNI.  - Inter-disciplinary family meet or Palliative Care meeting due by:  7/3  Joneen Roach, AGACNP-BC  [place] [facility] Pager [phone] or [phone]  10/28/2016 10:20 PM  Attending Attestation: I have personally seen and examined this patient and agree with the plan listed above. Briefly, this is an 82yoF with HTN, brought to EMS from home where she lives alone after family hadn't heard from her in 2 days. She was found lying in bed covered in urine and stool. She was only oriented x 2 and on arrival to the ER was in Afib with RVR with AKI. She has since been found to have severe sepsis due to UTI. On exam, she is somnolent but arousable to voice or light touch and able to answer questions with brief answers.  Lungs CTA b/l; no LE edema. CV: Irreg irreg. Soft BP with SBP 100's and MAP 63. She denies sob, cough, wheezing, cp, abd pain, n/v/d, dysuria, or leg pain. She says she just feels "bad." She reports she is DNR. No family is present with her. She says her POA is her Larence Penning; we called him and he concurred that she is DNR. In the ER patient received an appropriate IVF bolus and her lactate is already improving. Recommend continuing to trend lactate. Check procalcitonin. Blood cultures and Urine culture pending. Started Vanc and Zosyn as we are not sure at this time if she has been hospitalized recently or has any history of MDR organisms. Hopefully narrow antibiotic spectrum soon based on cultures and clinical response. Has AKI with BUN 36 and Creatinine 1.23 up from unknown baseline. Place foley catheter and continue gentle IVF's. Monitor UOP closely. Unclear if the Afib is new or old; is now in rate controlled Afib. Start Heparin gtt. Recommend Cardiology consult non-emergently. Troponin mildly elevated at 0.10 likely due to Type 2 NSTEMI from demand ischemia. Trend troponin. Admit patient to step-down unit. If her condition worsens, please re-consult [place].   60 minutes critical care time  Milana Obey, MD Pulmonary & Critical care medicine

## 2016-10-28 NOTE — ED Notes (Signed)
I stat lactic results given to Dr. Rubin Payor

## 2016-10-28 NOTE — ED Notes (Signed)
Pt transported to xray 

## 2016-10-28 NOTE — Progress Notes (Signed)
Pharmacy Antibiotic Note  Haley Tucker is a 83 y.o. female admitted on 10/28/2016 with sepsis.  Pharmacy has been consulted for vancomycin and Zosyn dosing.  One time vanc 1g IV and Zosyn 3.375g IV as ordered by EDP  Plan: Vancomycin 500mg  IV q24h Zosyn 3.375g IV q8h EI Follow c/s, clinical progression, renal function, level PRN  Height: 5\' 2"  (157.5 cm) Weight: 120 lb (54.4 kg) IBW/kg (Calculated) : 50.1  Temp (24hrs), Avg:101.7 F (38.7 C), Min:101.7 F (38.7 C), Max:101.7 F (38.7 C)   Recent Labs Lab 10/28/16 1655 10/28/16 1710  WBC 17.2*  --   CREATININE 1.23*  --   LATICACIDVEN  --  3.93*    Estimated Creatinine Clearance: 28.9 mL/min (A) (by C-G formula based on SCr of 1.23 mg/dL (H)).    Allergies not on file  Antimicrobials this admission: Vanc 6/28>> Zosyn 6/28>>  Dose adjustments this admission: n/a  Microbiology results: 6/28 BCx:  Thank you for allowing pharmacy to be a part of this patient's care.  Lonita Debes D. Dametri Ozburn, PharmD, BCPS Clinical Pharmacist [phone] 10/28/2016 6:09 PM

## 2016-10-28 NOTE — ED Notes (Signed)
MD aware of patient's HR - will notify when fluids are done.

## 2016-10-28 NOTE — Progress Notes (Signed)
ANTICOAGULATION CONSULT NOTE - Initial Consult  Pharmacy Consult for heparin Indication: atrial fibrillation  Not on File  Patient Measurements: Height: 5\' 2"  (157.5 cm) Weight: 120 lb (54.4 kg) IBW/kg (Calculated) : 50.1 Heparin Dosing Weight: 54kg  Vital Signs: Temp: 101.7 F (38.7 C) (06/28 1648) Temp Source: Rectal (06/28 1648) BP: 105/60 (06/28 2216) Pulse Rate: 88 (06/28 2216)  Labs:  Recent Labs  10/28/16 1655  HGB 14.6  HCT 43.8  PLT 218  LABPROT 20.5*  INR 1.73  CREATININE 1.23*  CKTOTAL 743*    Estimated Creatinine Clearance: 28.9 mL/min (A) (by C-G formula based on SCr of 1.23 mg/dL (H)).   Medical History: No past medical history on file.   Assessment: 82 YOF found down in her home with AFib with RVR- to start heparin. Unknown PTA medications and no family able to confirm list. No bleeding noted.  Goal of Therapy:  Heparin level 0.3-0.7 units/ml Monitor platelets by anticoagulation protocol: Yes   Plan:  Stat aPTT and heparin level Heparin 750 units/hr Daily heparin level and CBC  Mehmet Scally D. Kalmen Lollar, PharmD, BCPS Clinical Pharmacist [phone] 10/28/2016 10:42 PM

## 2016-10-28 NOTE — ED Notes (Signed)
Stopped per MD - HR maintaining NSR in the 80s. Fluids going KVO at this time.

## 2016-10-28 NOTE — ED Triage Notes (Signed)
Per [facility]: Pt to ED from home for possible sepsis - family had not heard from her in 2 days, called the town hall who had first responders check on her - pt was lying in bed x 2 days, alert and oriented to person and place only (normally A&O x 4, independent). EMS VS: 100F tympanic, 112/70 after NS (20g. PIV LAC), ST on monitor 110-120, 90% RA, placed on 2L O2 - increased to 95%. EtCO2 18-20. Pt follows commands. R knee appears erythematous and warm to touch. Pt c/o leg pain. Denies SOB/CP.

## 2016-10-28 NOTE — ED Provider Notes (Signed)
[facility]-EMERGENCY DEPT Provider Note   CSN: [id number] Arrival date & time: 10/28/16  1627     History   Chief Complaint Chief Complaint  Patient presents with  . Altered Mental Status    HPI Haley Tucker is a 83 y.o. female.  The history is provided by the EMS personnel. The history is limited by the condition of the patient.    Patient is an 83 year old female with past medical history significant for hypertension, who presents to the emergency department with altered mental status. Per EMS report, patient lives alone at home, family had not heard from her in a couple of days. The fire department went out to her house to check on her, she did not answer the door, so they had to break in. Patient was found laying in her bed and urine and feces. Patient was altered, only oriented to name which is not baseline according to the family members on the phone. Patient is not currently complaining of pain. States that it's 1917.  No past medical history on file.  Patient Active Problem List   Diagnosis Date Noted  . Severe sepsis (HCC) 10/28/2016    No past surgical history on file.  OB History    No data available       Home Medications    Prior to Admission medications   Medication Sig Start Date End Date Taking? Authorizing Provider  famotidine (PEPCID) 10 MG tablet Take 10 mg by mouth 2 (two) times daily.   Yes [provider]  MECLIZINE HCL PO Take by mouth.   Yes [provider]  Metoprolol-Hydrochlorothiazide (METOPROLOL-HCTZ ER PO) Take by mouth.   Yes [provider]  Potassium (POTASSIMIN PO) Take by mouth.   Yes [provider]  Solifenacin Succinate (VESICARE PO) Take by mouth.   Yes [provider]    Family History No family history on file.  Social History Social History  Substance Use Topics  . Smoking status: Not on file  . Smokeless tobacco: Not on file  . Alcohol use Not on file     Allergies     Patient has no allergy information on record.   Review of Systems Review of Systems  Unable to perform ROS: Mental status change     Physical Exam Updated Vital Signs BP 95/79   Pulse (!) 127   Temp (!) 101.7 F (38.7 C) (Rectal)   Resp (!) 24   Ht 5\' 2"  (1.575 m)   Wt 54.4 kg (120 lb)   SpO2 100%   BMI 21.95 kg/m   Physical Exam  Constitutional: She appears well-developed and well-nourished. She appears distressed.  HENT:  Head: Atraumatic.  Mouth/Throat: Oropharynx is clear and moist.  Eyes: EOM are normal. Pupils are equal, round, and reactive to light.  Neck: Normal range of motion. Neck supple. No tracheal deviation present.  Vertical midline scar to anterior neck  Cardiovascular: Regular rhythm, normal heart sounds and intact distal pulses.   Tachycardia  Pulmonary/Chest: Effort normal and breath sounds normal. No respiratory distress. She has no wheezes. She has no rales. She exhibits no tenderness.  2 L nasal cannula  Abdominal: She exhibits no distension and no mass. There is no tenderness. There is no rebound and no guarding.  Musculoskeletal: Normal range of motion. She exhibits no edema.  No unilateral leg swelling  Neurological: She is alert.  Oriented to person only. Confused when answering questions. States that it's 1917. Able to follow simple commands  and moving all extremities.  Skin: Skin is warm.  Right knee with mild swelling, warmth, overlying erythema. Midline vertical well-healed surgical scar over the right knee. No fluctuance, induration, red streaking, crepitance.  Psychiatric: She has a normal mood and affect.     ED Treatments / Results  Labs (all labs ordered are listed, but only abnormal results are displayed) Labs Reviewed  COMPREHENSIVE METABOLIC PANEL - Abnormal; Notable for the following:       Result Value   Sodium 134 (*)    CO2 18 (*)    BUN 36 (*)    Creatinine, Ser 1.23 (*)    Calcium 7.9 (*)    Total Protein 6.2 (*)     Albumin 2.8 (*)    AST 55 (*)    Total Bilirubin 1.8 (*)    GFR calc non Af Amer 40 (*)    GFR calc Af Amer 47 (*)    All other components within normal limits  CBC WITH DIFFERENTIAL/PLATELET - Abnormal; Notable for the following:    WBC 17.2 (*)    RDW 18.3 (*)    Neutro Abs 14.3 (*)    Monocytes Absolute 1.9 (*)    All other components within normal limits  PROTIME-INR - Abnormal; Notable for the following:    Prothrombin Time 20.5 (*)    All other components within normal limits  URINALYSIS, ROUTINE W REFLEX MICROSCOPIC - Abnormal; Notable for the following:    Color, Urine AMBER (*)    APPearance CLOUDY (*)    Hgb urine dipstick MODERATE (*)    Ketones, ur 80 (*)    Protein, ur 30 (*)    Nitrite POSITIVE (*)    Leukocytes, UA LARGE (*)    Bacteria, UA MANY (*)    Squamous Epithelial / LPF 0-5 (*)    Non Squamous Epithelial 0-5 (*)    All other components within normal limits  CK - Abnormal; Notable for the following:    Total CK 743 (*)    All other components within normal limits  I-STAT CG4 LACTIC ACID, ED - Abnormal; Notable for the following:    Lactic Acid, Venous 3.93 (*)    All other components within normal limits  I-STAT TROPOININ, ED - Abnormal; Notable for the following:    Troponin i, poc 0.10 (*)    All other components within normal limits  I-STAT CG4 LACTIC ACID, ED - Abnormal; Notable for the following:    Lactic Acid, Venous 3.80 (*)    All other components within normal limits  CULTURE, BLOOD (ROUTINE X 2)  CULTURE, BLOOD (ROUTINE X 2)  URINE CULTURE  LIPASE, BLOOD  MAGNESIUM  PHOSPHORUS  CORTISOL  CBC  BASIC METABOLIC PANEL  MAGNESIUM  PHOSPHORUS  I-STAT TROPOININ, ED    EKG  EKG Interpretation  Date/Time:  Thursday October 28 2016 16:56:18 EDT Ventricular Rate:  163 PR Interval:    QRS Duration: 79 QT Interval:  288 QTC Calculation: 475 R Axis:   -15 Text Interpretation:  Atrial fibrillation with rapid V-rate Ventricular premature  complex Borderline left axis deviation Abnormal R-wave progression, early transition Confirmed by Rubin Payor  MD, NATHAN [phone]) on 10/28/2016 4:59:20 PM       Radiology Dg Chest 2 View  Result Date: 10/28/2016 CLINICAL DATA:  Possible sepsis. EXAM: CHEST  2 VIEW COMPARISON:  None. FINDINGS: The lungs are clear without focal pneumonia, edema, pneumothorax or pleural effusion. Cardiopericardial silhouette is at upper limits of normal for size.  The visualized bony structures of the thorax are intact. Telemetry leads overlie the chest. IMPRESSION: No active cardiopulmonary disease. Electronically Signed   By: Kennith Center M.D.   On: 10/28/2016 17:42   Dg Knee 2 Views Right  Result Date: 10/28/2016 CLINICAL DATA:  83 year old female with possible sepsis. Right knee swelling and warmth. EXAM: RIGHT KNEE - 1-2 VIEW COMPARISON:  [facility] right femur series 12/30/15 and earlier FINDINGS: Partially visible intramedullary rod in the distal right femur with distal interlocking cortical screw. This hardware appears stable since 2017. No loosening. Superimposed right total knee arthroplasty. The total-knee hardware also appears stable and intact. No definite right knee joint effusion. Osteopenia. No cortical osteolysis identified about the right knee. No acute fracture. No subcutaneous gas. IMPRESSION: Stable radiographic appearance of the distal right femur and right total knee orthopedic hardware since 2017. No acute osseous abnormality identified. Electronically Signed   By: Odessa Fleming M.D.   On: 10/28/2016 17:57   Ct Head Wo Contrast  Result Date: 10/28/2016 CLINICAL DATA:  83 year old female with altered mental status. EXAM: CT HEAD WITHOUT CONTRAST TECHNIQUE: Contiguous axial images were obtained from the base of the skull through the vertex without intravenous contrast. COMPARISON:  01/22/2016 and earlier. FINDINGS: Brain: Stable cerebral volume. No midline shift, ventriculomegaly, mass effect,  evidence of mass lesion, intracranial hemorrhage or evidence of cortically based acute infarction. Patchy bilateral white matter hypodensity is stable since 2017. No cortical encephalomalacia. Vascular: Calcified atherosclerosis at the skull base. No suspicious intracranial vascular hyperdensity. Skull: Stable.  No acute osseous abnormality identified. Sinuses/Orbits: Stable.  Chronic right maxillary opacification. Other: Stable orbit and scalp soft tissues. IMPRESSION: No acute intracranial abnormality. Stable non contrast CT appearance of the brain since 2017. Electronically Signed   By: Odessa Fleming M.D.   On: 10/28/2016 18:00   Ct Abdomen Pelvis W Contrast  Result Date: 10/28/2016 CLINICAL DATA:  Diffuse abdominal pain with fever EXAM: CT ABDOMEN AND PELVIS WITH CONTRAST TECHNIQUE: Multidetector CT imaging of the abdomen and pelvis was performed using the standard protocol following bolus administration of intravenous contrast. CONTRAST:  ISOVUE-300 IOPAMIDOL (ISOVUE-300) INJECTION 61% COMPARISON:  05/31/2016 FINDINGS: Lower chest: Lung bases demonstrate patchy dependent atelectasis. No pleural effusion or consolidation. Coronary artery calcification. Trace pericardial effusion or thickening. Hepatobiliary: No focal liver abnormality is seen. Status post cholecystectomy. No biliary dilatation. Pancreas: Unremarkable. No pancreatic ductal dilatation or surrounding inflammatory changes. Spleen: Normal in size without focal abnormality. Adrenals/Urinary Tract: Adrenal glands are within normal limits. Subcentimeter hypodense lesions within the kidneys, too small to further characterize. Bladder unremarkable. Stomach/Bowel: Stomach is nonenlarged. No dilated small bowel. Appendix not well seen but no right lower quadrant inflammation is identified. Colon diverticula without acute inflammation. Moderate to large amount of stool in the colon with mild to moderate formed stools in the rectum. Vascular/Lymphatic:  Aortic atherosclerosis. No enlarged abdominal or pelvic lymph nodes. Reproductive: Status post hysterectomy. No adnexal masses. Other: No free air or free fluid Musculoskeletal: Status post right femoral rodding. Old fracture deformities of the right inferior and superior pubic rami knee. Heterogenous lucency in the sacrum unchanged. No acute osseous abnormality. Multiple old left-sided rib fractures. IMPRESSION: 1. No CT evidence for acute intra-abdominal or pelvic pathology. Moderate to large volume of stool in the colon 2. Old fracture deformities of the right pubic rami. Multiple old left-sided rib fractures. 3. Diverticular disease of the colon but without acute inflammation 4. Subcentimeter hypodense lesions in the kidneys, too small to further  characterize. Electronically Signed   By: Jasmine Pang M.D.   On: 10/28/2016 19:22    Procedures Procedures (including critical care time)   EMERGENCY DEPARTMENT [place] CARDIAC EXAM "Study: Limited Ultrasound of the Heart and Pericardium"  INDICATIONS:Abnormal vital signs Multiple views of the heart and pericardium were obtained in real-time with a multi-frequency probe.  PERFORMED JS:HFWYOV IMAGES ARCHIVED?: Yes LIMITATIONS:  None VIEWS USED: Subcostal 4 chamber, Parasternal long axis, Parasternal short axis, Apical 4 chamber  and Inferior Vena Cava INTERPRETATION: Cardiac activity present, Pericardial effusioin absent, Cardiac tamponade absent, Probable low CVP, Normal contractility and IVC flat    Medications Ordered in ED Medications  vancomycin (VANCOCIN) 500 mg in sodium chloride 0.9 % 100 mL IVPB (not administered)  piperacillin-tazobactam (ZOSYN) IVPB 3.375 g (not administered)  0.9 %  sodium chloride infusion (not administered)  heparin injection 5,000 Units (not administered)  pantoprazole (PROTONIX) injection 40 mg (not administered)  piperacillin-tazobactam (ZOSYN) IVPB 3.375 g (0 g Intravenous Stopped 10/28/16 1750)  vancomycin  (VANCOCIN) IVPB 1000 mg/200 mL premix (0 mg Intravenous Stopped 10/28/16 1853)  lactated ringers bolus 1,632 mL (0 mL/kg  54.4 kg Intravenous Stopped 10/28/16 1927)  acetaminophen (TYLENOL) suppository 650 mg (650 mg Rectal Given 10/28/16 1752)  iopamidol (ISOVUE-300) 61 % injection (100 mLs  Contrast Given 10/28/16 1834)  sodium chloride 0.9 % bolus 1,000 mL (0 mLs Intravenous Stopped 10/28/16 2053)  lactated ringers bolus 1,000 mL (0 mLs Intravenous Stopped 10/28/16 2032)  lactated ringers bolus 1,000 mL (1,000 mLs Intravenous New Bag/Given 10/28/16 2053)     Initial Impression / Assessment and Plan / ED Course  I have reviewed the triage vital signs and the nursing notes.  Pertinent labs & imaging results that were available during my care of the patient were reviewed by me and considered in my medical decision making (see chart for details).     Patient is an 83 year old female with past medical history of hypertension, who presents to the emergency department with altered mental status. Down time of approximately 2 days, found in her bed with feces and urine. Patient is not at her mental status baseline. On EMS arrival BP 90/60, HR 110s. In the ED, temp 101.3.   Code sepsis was initiated on the patient. Blood cultures obtained. Given antipyretics, 30 cc/kg IVF, empiric broad spectrum abx for unknown source. CXR, UA, XR R knee obtained. Lab work remarkable for leukocytosis of 17. Lactic acid 3.9. Initial troponin 0.06. Chest x-ray showed no signs of pneumonia. BUN/Creatinine 36/1.23 (pre-renal AKI). Bicarbonate 18 without significant electrolyte abnormalities. CK 700. EKG showed A. fib with RVR, rate 163, nonspecific ST changes. No signs of acute ischemia.  Patient's A. fib with RVR likely secondary to underlying sepsis. On reevaluation patient became hypotensive, MAP 62. HR 120-150s. Pt meets criteria for septic shock, UTI likely source, hypotensive and febrile. Bedside cardiac ultrasound  obtained after initial fluid resuscitation, continues to have signs of a flat IVC. Ordered 1L IVF.  CT A/P showed no acute findings. UA showed signs of UTI. Patient's right knee had a knee replacement recently, doubt septic joint, more likely inflammatory response.  On reevaluation patient converted to normal sinus rhythm, rate 80s, MAP 80.  Patient admitted to stepdown unit. Repeat LA 3.8. Trop 0.1 (likely demand ischemia in the setting of sepsis and Afib w/RVR)  Pt's POA, Carlean Purl [phone]), Nephew.  Final Clinical Impressions(s) / ED Diagnoses   Final diagnoses:  Septic shock (HCC)  Acute cystitis with hematuria  Altered  mental status, unspecified altered mental status type  Atrial fibrillation with RVR [facility])    New Prescriptions New Prescriptions   No medications on file     Corena Herter, MD 10/28/16 2151    Benjiman Core, MD 10/29/16 [phone]

## 2016-10-29 ENCOUNTER — Encounter (HOSPITAL_COMMUNITY): Payer: Self-pay

## 2016-10-29 DIAGNOSIS — M6282 Rhabdomyolysis: Secondary | ICD-10-CM

## 2016-10-29 DIAGNOSIS — G9341 Metabolic encephalopathy: Secondary | ICD-10-CM

## 2016-10-29 DIAGNOSIS — N3001 Acute cystitis with hematuria: Secondary | ICD-10-CM

## 2016-10-29 DIAGNOSIS — I4891 Unspecified atrial fibrillation: Secondary | ICD-10-CM

## 2016-10-29 LAB — COMPREHENSIVE METABOLIC PANEL
ALK PHOS: 75 U/L (ref 38–126)
ALT: 26 U/L (ref 14–54)
AST: 35 U/L (ref 15–41)
Albumin: 2.5 g/dL — ABNORMAL LOW (ref 3.5–5.0)
Anion gap: 10 (ref 5–15)
BILIRUBIN TOTAL: 1.5 mg/dL — AB (ref 0.3–1.2)
BUN: 24 mg/dL — ABNORMAL HIGH (ref 6–20)
CALCIUM: 7.6 mg/dL — AB (ref 8.9–10.3)
CO2: 17 mmol/L — AB (ref 22–32)
CREATININE: 0.8 mg/dL (ref 0.44–1.00)
Chloride: 108 mmol/L (ref 101–111)
Glucose, Bld: 89 mg/dL (ref 65–99)
Potassium: 3.7 mmol/L (ref 3.5–5.1)
Sodium: 135 mmol/L (ref 135–145)
Total Protein: 5.9 g/dL — ABNORMAL LOW (ref 6.5–8.1)

## 2016-10-29 LAB — BASIC METABOLIC PANEL
Anion gap: 7 (ref 5–15)
Anion gap: 7 (ref 5–15)
BUN: 24 mg/dL — AB (ref 6–20)
BUN: 24 mg/dL — ABNORMAL HIGH (ref 6–20)
CALCIUM: 7.2 mg/dL — AB (ref 8.9–10.3)
CALCIUM: 7.2 mg/dL — AB (ref 8.9–10.3)
CO2: 22 mmol/L (ref 22–32)
CO2: 21 mmol/L — ABNORMAL LOW (ref 22–32)
CREATININE: 0.88 mg/dL (ref 0.44–1.00)
CREATININE: 0.87 mg/dL (ref 0.44–1.00)
Chloride: 105 mmol/L (ref 101–111)
Chloride: 105 mmol/L (ref 101–111)
GFR calc Af Amer: >60 mL/min (ref >60)
GFR calc Af Amer: >60 mL/min (ref >60)
GLUCOSE: 123 mg/dL — AB (ref 65–99)
GLUCOSE: 119 mg/dL — AB (ref 65–99)
Potassium: 3.6 mmol/L (ref 3.5–5.1)
Potassium: 3.7 mmol/L (ref 3.5–5.1)
SODIUM: 134 mmol/L — AB (ref 135–145)
Sodium: 133 mmol/L — ABNORMAL LOW (ref 135–145)

## 2016-10-29 LAB — CBC
HCT: 39.5 % (ref 36.0–46.0)
HCT: 41.7 % (ref 36.0–46.0)
Hemoglobin: 12.8 g/dL (ref 12.0–15.0)
Hemoglobin: 14 g/dL (ref 12.0–15.0)
MCH: 29.4 pg (ref 26.0–34.0)
MCH: 30.3 pg (ref 26.0–34.0)
MCHC: 32.4 g/dL (ref 30.0–36.0)
MCHC: 33.6 g/dL (ref 30.0–36.0)
MCV: 90.8 fL (ref 78.0–100.0)
MCV: 90.3 fL (ref 78.0–100.0)
PLATELETS: 204 10*3/uL (ref 150–400)
PLATELETS: 196 10*3/uL (ref 150–400)
RBC: 4.35 MIL/uL (ref 3.87–5.11)
RBC: 4.62 MIL/uL (ref 3.87–5.11)
RDW: 18.4 % — AB (ref 11.5–15.5)
RDW: 18 % — AB (ref 11.5–15.5)
WBC: 11 10*3/uL — ABNORMAL HIGH (ref 4.0–10.5)
WBC: 10.6 10*3/uL — ABNORMAL HIGH (ref 4.0–10.5)

## 2016-10-29 LAB — HEPATIC FUNCTION PANEL
ALBUMIN: 2.3 g/dL — AB (ref 3.5–5.0)
ALK PHOS: 64 U/L (ref 38–126)
ALT: 27 U/L (ref 14–54)
AST: 33 U/L (ref 15–41)
BILIRUBIN TOTAL: 1.6 mg/dL — AB (ref 0.3–1.2)
Bilirubin, Direct: 0.5 mg/dL (ref 0.1–0.5)
Indirect Bilirubin: 1.1 mg/dL — ABNORMAL HIGH (ref 0.3–0.9)
Total Protein: 5.3 g/dL — ABNORMAL LOW (ref 6.5–8.1)

## 2016-10-29 LAB — TROPONIN I
TROPONIN I: 0.13 ng/mL — AB (ref <0.03)
Troponin I: 0.12 ng/mL (ref <0.03)
Troponin I: 0.07 ng/mL (ref <0.03)
Troponin I: 0.14 ng/mL (ref <0.03)

## 2016-10-29 LAB — PHOSPHORUS
PHOSPHORUS: 2.1 mg/dL — AB (ref 2.5–4.6)
Phosphorus: 2 mg/dL — ABNORMAL LOW (ref 2.5–4.6)

## 2016-10-29 LAB — TSH: TSH: 1.306 u[IU]/mL (ref 0.350–4.500)

## 2016-10-29 LAB — HEPARIN LEVEL (UNFRACTIONATED)
Heparin Unfractionated: <0.1 IU/mL — ABNORMAL LOW (ref 0.30–0.70)
Heparin Unfractionated: <0.1 IU/mL — ABNORMAL LOW (ref 0.30–0.70)

## 2016-10-29 LAB — LACTIC ACID, PLASMA
Lactic Acid, Venous: 3.3 mmol/L (ref 0.5–1.9)
Lactic Acid, Venous: 1.7 mmol/L (ref 0.5–1.9)

## 2016-10-29 LAB — PROCALCITONIN: PROCALCITONIN: 5.34 ng/mL

## 2016-10-29 LAB — CORTISOL: Cortisol, Plasma: 15.6 ug/dL

## 2016-10-29 LAB — APTT: aPTT: 30 seconds (ref 24–36)

## 2016-10-29 LAB — T4, FREE: Free T4: 0.94 ng/dL (ref 0.61–1.12)

## 2016-10-29 LAB — MRSA PCR SCREENING: MRSA by PCR: INVALID — AB

## 2016-10-29 LAB — MAGNESIUM: MAGNESIUM: 2 mg/dL (ref 1.7–2.4)

## 2016-10-29 MED ORDER — DILTIAZEM LOAD VIA INFUSION
10.0000 mg | Freq: Once | INTRAVENOUS | Status: AC
  Administered 2016-10-29: 10 mg via INTRAVENOUS
  Filled 2016-10-29: qty 10

## 2016-10-29 MED ORDER — SODIUM CHLORIDE 0.9 % IV SOLN
INTRAVENOUS | Status: DC
  Administered 2016-10-29 – 2016-10-30 (×2): via INTRAVENOUS

## 2016-10-29 MED ORDER — METOPROLOL TARTRATE 5 MG/5ML IV SOLN
2.5000 mg | INTRAVENOUS | Status: DC | PRN
  Administered 2016-10-29: 5 mg via INTRAVENOUS
  Filled 2016-10-29: qty 5

## 2016-10-29 MED ORDER — BISACODYL 10 MG RE SUPP
10.0000 mg | Freq: Once | RECTAL | Status: AC
  Administered 2016-10-29: 10 mg via RECTAL
  Filled 2016-10-29: qty 1

## 2016-10-29 MED ORDER — METOPROLOL TARTRATE 5 MG/5ML IV SOLN
2.5000 mg | INTRAVENOUS | Status: DC | PRN
  Administered 2016-10-29 – 2016-10-31 (×2): 2.5 mg via INTRAVENOUS
  Filled 2016-10-29 (×3): qty 5

## 2016-10-29 MED ORDER — METOPROLOL TARTRATE 5 MG/5ML IV SOLN
2.5000 mg | Freq: Once | INTRAVENOUS | Status: AC
  Administered 2016-10-29: 2.5 mg via INTRAVENOUS

## 2016-10-29 MED ORDER — SODIUM PHOSPHATES 45 MMOLE/15ML IV SOLN
20.0000 mmol | Freq: Once | INTRAVENOUS | Status: AC
  Administered 2016-10-29: 20 mmol via INTRAVENOUS
  Filled 2016-10-29 (×2): qty 6.67

## 2016-10-29 MED ORDER — SODIUM PHOSPHATES 45 MMOLE/15ML IV SOLN
10.0000 mmol | Freq: Once | INTRAVENOUS | Status: DC
  Filled 2016-10-29: qty 3.33

## 2016-10-29 MED ORDER — HEPARIN BOLUS VIA INFUSION
1800.0000 [IU] | Freq: Once | INTRAVENOUS | Status: AC
  Administered 2016-10-29: 1800 [IU] via INTRAVENOUS
  Filled 2016-10-29: qty 1800

## 2016-10-29 MED ORDER — DILTIAZEM HCL 100 MG IV SOLR
5.0000 mg/h | INTRAVENOUS | Status: DC
  Administered 2016-10-29: 5 mg/h via INTRAVENOUS
  Filled 2016-10-29 (×2): qty 100

## 2016-10-29 MED ORDER — PIPERACILLIN-TAZOBACTAM 3.375 G IVPB
3.3750 g | Freq: Three times a day (TID) | INTRAVENOUS | Status: DC
  Filled 2016-10-29: qty 50

## 2016-10-29 NOTE — Progress Notes (Signed)
Just after 2200, received called from Central Telemetry that pt was in Afib with RVR rates 150-170s. This RN was not on floor however charge RN, Lanice Schwab, was contacted and she obtained EKG and PRN metoprolol was given per pt orders. NP on call also notified and a one time order for Metoprolol was ordered and administered. Pt received a total of 5mg  IV metoprolol. Pt with multiple pauses ranging from 2-4.76 seconds and then at 2248 pt converted to NSR rate in the 70-80s. BP remained stable throughout in the 110-120s sys. And pt remained lethargic with orientation to person and place alone as noted in assessment at 2055. Sats were also stable 98-100% on 2 liters and no labored or dyspnea noted. NP on call had ordered cardizem and bolus however it was not initially ordered as pt had converted prior to administration and NP then ordered that gtt was to be "held and watch the pt". While giving report to oncoming RN, 2056, pt then converted from NSR to Afib RVR again with rates in the 140s. NP [facility] notified, informed of pt's current rhythm and rate and gtt ordered to be initiated as originally ordered at this time. [facility], RN to initial gtt as well as manage pt care at this time.  Vitals:   10/29/16 2253 10/29/16 2300 10/29/16 2330 10/29/16 2332  BP: (!) 116/54 (!) 111/55 113/79 113/79  Pulse: 80 (!) 109 (!) 133 (!) 130  Resp: (!) 28 (!) 31 (!) 31 (!) 31  Temp:    99.3 F (37.4 C)  TempSrc:    Oral  SpO2: 100% 100% 100%   Weight:      Height:

## 2016-10-29 NOTE — Progress Notes (Signed)
PROGRESS NOTE    Haley Tucker   HWT:[id number]  DOB: 1934-02-23  DOA: 10/28/2016 PCP: No primary care provider on file.   Brief Narrative:  83 y/o female with a h/o Rheumatoid arthritis, frequent UTIs,   HTN who lives alone was brought in by EMS for confusion. She was found laying in feces and urine. Her family had not heard from her in 2 days and called the town hall who called first responders.  In the ER she was noted to be alert but disoriented. BP 90/60, HR in 110s, Temp 101.3 with a UA consistent with UTI, warm and erythematous right knee and tender abdomen. CT abd/pelvis was normal except for a large stool burden. She has had a right knee replacement and xrays reveal hardware to be stable and no abnormalities.  Lactic acid 3.93 Procalcitonin 5.34 CK total 743 WBV 17.2 with left shift BUN 376, Cr 1.23, sodium 134 Has mildly elevated troponin. CT head normal - noted to have A-fib with RVR on admission- given aggressive fluid boluses and converted to NSR Noted to be hypoxic but had normal CXR Admitted for sepsis thought to be due to a UTI.   Subjective: Difficult to awaken this AM. Able to open eyes briefly and speak with me. Knows she is at [facility]. She has no complaints.   Assessment & Plan:   Active Problems:   Severe sepsis (HCC) - vitals and labs as mentioned above consistent with severe sepsis -  lactic acid has normalized -  will continue Vanc and Zosyn- f/u blood cultures and U culture - keep eye on knee- there is some mild erythema and tenderness but it is not swollen it dose not appear to be septic arthritis - may be injured from a fall from possibly laying on it for a prolonged period-  - abdomen is tender but not signs of diverticulitis or colitis on CT- follow for vomiting or diarrhea  A-fib with RVR - cannot tell if she has a h/o this - currently in NSR - will obtain ECHO, thyroid functions - CHA2DS2-VASc Score at least 4 - on heparin infusion which  was started in the ER- will continue -will need to assess fall risk before sending home on NOAC - noted to be on Metoprolol at home- on hold at this time due to sepsis- can use PRN for rapid A-fib  Acute encephalopathy - CT head in ER unrevealing but at risk for CVA due to A-fib - is aware she is in the hospital today - likely due to UTI/ Sepsis- follow for improvement   Hypoxia - ? Due to sepsis - clear lungs on exam- no respiratory distress- CXR clear - I have been able to wean her to room air this AM  Abdominal tenderness and constipation - Dulcolax suppository ordered-   PRN enema if Dulcolax ineffective  AKI - resolved with IVF- cont to follow  Hyponatremia - cont to hydrate and follow - likely is due to HCTZ which is on hold  Elevated Troponin - mild elevation noted- f/u on ECHO- suspect cardiac strain due to sepsis and A-fib  Rhabdomyolysis - cont IV hydration and follow  Hypophosphatemia - replace  Elevated T bili - follow- no abnormality noted on CT abd/pelvis- possibly due to sepsis   DVT prophylaxis: Heparin infusion Code Status: DNR Family Communication:  Disposition Plan: to be determined Consultants:   none Procedures:    Antimicrobials:  Anti-infectives    Start     Dose/Rate Route  Frequency Ordered Stop   10/29/16 1700  vancomycin (VANCOCIN) 500 mg in sodium chloride 0.9 % 100 mL IVPB     500 mg 100 mL/hr over 60 Minutes Intravenous Every 24 hours 10/28/16 1810     10/28/16 2200  piperacillin-tazobactam (ZOSYN) IVPB 3.375 g     3.375 g 12.5 mL/hr over 240 Minutes Intravenous Every 8 hours 10/28/16 1810     10/28/16 1700  piperacillin-tazobactam (ZOSYN) IVPB 3.375 g     3.375 g 100 mL/hr over 30 Minutes Intravenous  Once 10/28/16 1650 10/28/16 1750   10/28/16 1700  vancomycin (VANCOCIN) IVPB 1000 mg/200 mL premix     1,000 mg 200 mL/hr over 60 Minutes Intravenous  Once 10/28/16 1650 10/28/16 1853       Objective: Vitals:   10/29/16 0400  10/29/16 0422 10/29/16 0434 10/29/16 0800  BP:  (!) 98/51 (!) 106/56 120/64  Pulse: 72 74 74 85  Resp: (!) 27 19 (!) 27 (!) 26  Temp:   98.6 F (37 C) 98.6 F (37 C)  TempSrc:   Axillary Oral  SpO2: 100% 99% 100% 99%  Weight:      Height:        Intake/Output Summary (Last 24 hours) at 10/29/16 1217 Last data filed at 10/29/16 0700  Gross per 24 hour  Intake             5042 ml  Output              185 ml  Net             4857 ml   Filed Weights   10/28/16 1646 10/29/16 0015  Weight: 54.4 kg (120 lb) 62.6 kg (138 lb)    Examination: General exam: Appears comfortable - sleepy  HEENT: PERRLA, oral mucosa dry, no sclera icterus or thrush Respiratory system: Clear to auscultation. Respiratory effort normal. Cardiovascular system: S1 & S2 heard, RRR.  No murmurs  Gastrointestinal system: Abdomen soft,  Tender in epigastrium and across lower abdomen, nondistended. Normal bowel sound. No organomegaly Central nervous system:   No focal neurological deficits.- oriented to person and place today Extremities: No cyanosis, clubbing or edema Skin: mild erythema of right knee with mild tenderness Psychiatry:  Mood & affect appropriate.     Data Reviewed: I have personally reviewed following labs and imaging studies  CBC:  Recent Labs Lab 10/28/16 1655 10/29/16 0705  WBC 17.2* 11.0*  NEUTROABS 14.3*  --   HGB 14.6 12.8  HCT 43.8 39.5  MCV 90.9 90.8  PLT 218 204   Basic Metabolic Panel:  Recent Labs Lab 10/28/16 1655 10/28/16 2048 10/28/16 2320 10/29/16 0705 10/29/16 0740  NA 134*  --   --  134* 133*  K 4.0  --   --  3.6 3.7  CL 103  --   --  105 105  CO2 18*  --   --  22 21*  GLUCOSE 84  --   --  123* 119*  BUN 36*  --   --  24* 24*  CREATININE 1.23*  --   --  0.88 0.87  CALCIUM 7.9*  --   --  7.2* 7.2*  MG  --  1.7  --  2.0  --   PHOS  --   --  2.1* 2.0*  --    GFR: Estimated Creatinine Clearance: 44.9 mL/min (by C-G formula based on SCr of 0.87  mg/dL). Liver Function Tests:  Recent Labs Lab 10/28/16 1655  10/29/16 0705  AST 55* 33  ALT 38 27  ALKPHOS 81 64  BILITOT 1.8* 1.6*  PROT 6.2* 5.3*  ALBUMIN 2.8* 2.3*    Recent Labs Lab 10/28/16 1655  LIPASE 27   No results for input(s): AMMONIA in the last 168 hours. Coagulation Profile:  Recent Labs Lab 10/28/16 1655  INR 1.73   Cardiac Enzymes:  Recent Labs Lab 10/28/16 1655 10/28/16 2320 10/29/16 0705  CKTOTAL 743*  --   --   TROPONINI  --  0.12* 0.13*  0.07*   BNP (last 3 results) No results for input(s): PROBNP in the last 8760 hours. HbA1C: No results for input(s): HGBA1C in the last 72 hours. CBG: No results for input(s): GLUCAP in the last 168 hours. Lipid Profile: No results for input(s): CHOL, HDL, LDLCALC, TRIG, CHOLHDL, LDLDIRECT in the last 72 hours. Thyroid Function Tests: No results for input(s): TSH, T4TOTAL, FREET4, T3FREE, THYROIDAB in the last 72 hours. Anemia Panel: No results for input(s): VITAMINB12, FOLATE, FERRITIN, TIBC, IRON, RETICCTPCT in the last 72 hours. Urine analysis:    Component Value Date/Time   COLORURINE AMBER (A) 10/28/2016 1804   APPEARANCEUR CLOUDY (A) 10/28/2016 1804   LABSPEC 1.017 10/28/2016 1804   PHURINE 5.0 10/28/2016 1804   GLUCOSEU NEGATIVE 10/28/2016 1804   HGBUR MODERATE (A) 10/28/2016 1804   BILIRUBINUR NEGATIVE 10/28/2016 1804   KETONESUR 80 (A) 10/28/2016 1804   PROTEINUR 30 (A) 10/28/2016 1804   NITRITE POSITIVE (A) 10/28/2016 1804   LEUKOCYTESUR LARGE (A) 10/28/2016 1804   Sepsis Labs: @LABRCNTIP (procalcitonin:4,lacticidven:4) ) Recent Results (from the past 240 hour(s))  MRSA PCR Screening     Status: Abnormal   Collection Time: 10/28/16 10:57 PM  Result Value Ref Range Status   MRSA by PCR INVALID RESULTS, SPECIMEN SENT FOR CULTURE (A) NEGATIVE Final    Comment: J.WOODY,RN AT 0330 BY L.PITT 10/29/16        The GeneXpert MRSA Assay (FDA approved for NASAL specimens only), is one  component of a comprehensive MRSA colonization surveillance program. It is not intended to diagnose MRSA infection nor to guide or monitor treatment for MRSA infections.          Radiology Studies: Dg Chest 2 View  Result Date: 10/28/2016 CLINICAL DATA:  Possible sepsis. EXAM: CHEST  2 VIEW COMPARISON:  None. FINDINGS: The lungs are clear without focal pneumonia, edema, pneumothorax or pleural effusion. Cardiopericardial silhouette is at upper limits of normal for size. The visualized bony structures of the thorax are intact. Telemetry leads overlie the chest. IMPRESSION: No active cardiopulmonary disease. Electronically Signed   By: 10/30/2016 M.D.   On: 10/28/2016 17:42   Dg Knee 2 Views Right  Result Date: 10/28/2016 CLINICAL DATA:  83 year old female with possible sepsis. Right knee swelling and warmth. EXAM: RIGHT KNEE - 1-2 VIEW COMPARISON:  [facility] right femur series 12/30/15 and earlier FINDINGS: Partially visible intramedullary rod in the distal right femur with distal interlocking cortical screw. This hardware appears stable since 2017. No loosening. Superimposed right total knee arthroplasty. The total-knee hardware also appears stable and intact. No definite right knee joint effusion. Osteopenia. No cortical osteolysis identified about the right knee. No acute fracture. No subcutaneous gas. IMPRESSION: Stable radiographic appearance of the distal right femur and right total knee orthopedic hardware since 2017. No acute osseous abnormality identified. Electronically Signed   By: 2018 M.D.   On: 10/28/2016 17:57   Ct Head Wo Contrast  Result Date: 10/28/2016 CLINICAL DATA:  83 year old female with altered mental status. EXAM: CT HEAD WITHOUT CONTRAST TECHNIQUE: Contiguous axial images were obtained from the base of the skull through the vertex without intravenous contrast. COMPARISON:  01/22/2016 and earlier. FINDINGS: Brain: Stable cerebral volume. No midline shift,  ventriculomegaly, mass effect, evidence of mass lesion, intracranial hemorrhage or evidence of cortically based acute infarction. Patchy bilateral white matter hypodensity is stable since 2017. No cortical encephalomalacia. Vascular: Calcified atherosclerosis at the skull base. No suspicious intracranial vascular hyperdensity. Skull: Stable.  No acute osseous abnormality identified. Sinuses/Orbits: Stable.  Chronic right maxillary opacification. Other: Stable orbit and scalp soft tissues. IMPRESSION: No acute intracranial abnormality. Stable non contrast CT appearance of the brain since 2017. Electronically Signed   By: Odessa Fleming M.D.   On: 10/28/2016 18:00   Ct Abdomen Pelvis W Contrast  Result Date: 10/28/2016 CLINICAL DATA:  Diffuse abdominal pain with fever EXAM: CT ABDOMEN AND PELVIS WITH CONTRAST TECHNIQUE: Multidetector CT imaging of the abdomen and pelvis was performed using the standard protocol following bolus administration of intravenous contrast. CONTRAST:  ISOVUE-300 IOPAMIDOL (ISOVUE-300) INJECTION 61% COMPARISON:  05/31/2016 FINDINGS: Lower chest: Lung bases demonstrate patchy dependent atelectasis. No pleural effusion or consolidation. Coronary artery calcification. Trace pericardial effusion or thickening. Hepatobiliary: No focal liver abnormality is seen. Status post cholecystectomy. No biliary dilatation. Pancreas: Unremarkable. No pancreatic ductal dilatation or surrounding inflammatory changes. Spleen: Normal in size without focal abnormality. Adrenals/Urinary Tract: Adrenal glands are within normal limits. Subcentimeter hypodense lesions within the kidneys, too small to further characterize. Bladder unremarkable. Stomach/Bowel: Stomach is nonenlarged. No dilated small bowel. Appendix not well seen but no right lower quadrant inflammation is identified. Colon diverticula without acute inflammation. Moderate to large amount of stool in the colon with mild to moderate formed stools in the  rectum. Vascular/Lymphatic: Aortic atherosclerosis. No enlarged abdominal or pelvic lymph nodes. Reproductive: Status post hysterectomy. No adnexal masses. Other: No free air or free fluid Musculoskeletal: Status post right femoral rodding. Old fracture deformities of the right inferior and superior pubic rami knee. Heterogenous lucency in the sacrum unchanged. No acute osseous abnormality. Multiple old left-sided rib fractures. IMPRESSION: 1. No CT evidence for acute intra-abdominal or pelvic pathology. Moderate to large volume of stool in the colon 2. Old fracture deformities of the right pubic rami. Multiple old left-sided rib fractures. 3. Diverticular disease of the colon but without acute inflammation 4. Subcentimeter hypodense lesions in the kidneys, too small to further characterize. Electronically Signed   By: Jasmine Pang M.D.   On: 10/28/2016 19:22      Scheduled Meds: . pantoprazole (PROTONIX) IV  40 mg Intravenous QHS   Continuous Infusions: . sodium chloride 250 mL (10/28/16 2338)  . heparin 900 Units/hr (10/29/16 0950)  . piperacillin-tazobactam (ZOSYN)  IV Stopped (10/29/16 0338)  . sodium phosphate  Dextrose 5% IVPB 10 mmol (10/29/16 1019)  . vancomycin       LOS: 1 day    Time spent in minutes: 35    Calvert Cantor, MD [facility] Pager: [email or web address] Password [facility] 10/29/2016, 12:17 PM

## 2016-10-29 NOTE — Progress Notes (Signed)
CRITICAL VALUE ALERT  Critical Value:  Troponin #2=0.12  Date & Time Notied:  10/29/2016 @ 0057  Provider Notified: Beverely Low PCCM  Orders Received/Actions taken: None

## 2016-10-29 NOTE — Progress Notes (Signed)
ANTICOAGULATION CONSULT NOTE  Pharmacy Consult for heparin Indication: atrial fibrillation  No Known Allergies  Patient Measurements: Height: 5\' 2"  (157.5 cm) Weight: 138 lb (62.6 kg) IBW/kg (Calculated) : 50.1 Heparin Dosing Weight: 63 kg  Vital Signs: Temp: 99.9 F (37.7 C) (06/29 1952) Temp Source: Oral (06/29 1952) BP: 105/79 (06/29 1952) Pulse Rate: 102 (06/29 1952)  Labs:  Recent Labs  10/28/16 1655 10/28/16 2320 10/29/16 0705 10/29/16 0740 10/29/16 1226 10/29/16 1355 10/29/16 1741  HGB 14.6  --  12.8  --  14.0  --   --   HCT 43.8  --  39.5  --  41.7  --   --   PLT 218  --  204  --  196  --   --   APTT  --  30  --   --   --   --   --   LABPROT 20.5*  --   --   --   --   --   --   INR 1.73  --   --   --   --   --   --   HEPARINUNFRC  --  <0.10* <0.10*  --   --   --  <0.10*  CREATININE 1.23*  --  0.88 0.87  --  0.80  --   CKTOTAL 743*  --   --   --   --   --   --   TROPONINI  --  0.12* 0.07*  0.13*  --  0.14*  --   --     Estimated Creatinine Clearance: 48.8 mL/min (by C-G formula based on SCr of 0.8 mg/dL).  Assessment: 83 y/o female on IV heparin for Afib. Heparin level remains undetectable at <0.1. Spoke with RN and no significant problems with infusion and no bleeding.   Goal of Therapy:  Heparin level 0.3-0.7 units/ml Monitor platelets by anticoagulation protocol: Yes   Plan:  Heparin 1800 units IV bolus Increase heparin drip to to 1100 units/hr 8 hr heparin level Daily heparin level and CBC Monitor for s/sx of bleeding   96, PharmD, BCPS Clinical Pharmacist Phone for tonight [phone] Main pharmacy - [phone] 10/29/2016 8:25 PM

## 2016-10-29 NOTE — Progress Notes (Signed)
CRITICAL VALUE ALERT  Critical Value:  Lactic acid=3.3  Date & Time Notied:  10/29/2016 @ 0030  Provider Notified: Beverely Low PCCM  Orders Received/Actions taken: None at this time

## 2016-10-29 NOTE — Progress Notes (Signed)
ANTICOAGULATION CONSULT NOTE - Initial Consult  Pharmacy Consult for heparin Indication: atrial fibrillation  No Known Allergies  Patient Measurements: Height: 5\' 2"  (157.5 cm) Weight: 138 lb (62.6 kg) IBW/kg (Calculated) : 50.1 Heparin Dosing Weight: 63 kg  Vital Signs: Temp: 98.6 F (37 C) (06/29 0434) Temp Source: Axillary (06/29 0434) BP: 106/56 (06/29 0434) Pulse Rate: 74 (06/29 0434)  Labs:  Recent Labs  10/28/16 1655 10/28/16 2320 10/29/16 0705 10/29/16 0740  HGB 14.6  --  12.8  --   HCT 43.8  --  39.5  --   PLT 218  --  204  --   APTT  --  30  --   --   LABPROT 20.5*  --   --   --   INR 1.73  --   --   --   HEPARINUNFRC  --  <0.10* <0.10*  --   CREATININE 1.23*  --  0.88 0.87  CKTOTAL 743*  --   --   --   TROPONINI  --  0.12* 0.35*  --     Estimated Creatinine Clearance: 44.9 mL/min (by C-G formula based on SCr of 0.87 mg/dL).  Assessment: CC/HPI: 83 yo f presenting with AMS  PMH: RA, AFib, HTN, recurrent UTI  Anticoag: none pta but hx of afib to hep gtt.  Initial HL < 0.1 on 750 units/hr  Renal: SCr 0.87  Heme/Onc: H&H 12.8/39.5, Plt 204  Goal of Therapy:  Heparin level 0.3-0.7 units/ml Monitor platelets by anticoagulation protocol: Yes   Plan:  Heparin bolus 1800 units x 1 Increase gtt to 900 units/hr Next HL 1700 Daily HL, CBC  06-21-1998, PharmD, BCPS, BCCCP Clinical Pharmacist Clinical phone for 10/29/2016 from 7a-3:30p: 10/31/2016 If after 3:30p, please call main pharmacy at: [phone] 10/29/2016 9:11 AM

## 2016-10-30 ENCOUNTER — Inpatient Hospital Stay (HOSPITAL_COMMUNITY): Payer: Medicare Other

## 2016-10-30 DIAGNOSIS — I360 Nonrheumatic tricuspid (valve) stenosis: Secondary | ICD-10-CM

## 2016-10-30 DIAGNOSIS — R6520 Severe sepsis without septic shock: Secondary | ICD-10-CM

## 2016-10-30 DIAGNOSIS — I4891 Unspecified atrial fibrillation: Secondary | ICD-10-CM

## 2016-10-30 DIAGNOSIS — T796XXS Traumatic ischemia of muscle, sequela: Secondary | ICD-10-CM

## 2016-10-30 DIAGNOSIS — G9341 Metabolic encephalopathy: Secondary | ICD-10-CM

## 2016-10-30 DIAGNOSIS — N3001 Acute cystitis with hematuria: Secondary | ICD-10-CM

## 2016-10-30 DIAGNOSIS — R0902 Hypoxemia: Secondary | ICD-10-CM

## 2016-10-30 DIAGNOSIS — A419 Sepsis, unspecified organism: Principal | ICD-10-CM

## 2016-10-30 DIAGNOSIS — I5031 Acute diastolic (congestive) heart failure: Secondary | ICD-10-CM

## 2016-10-30 LAB — COMPREHENSIVE METABOLIC PANEL
ALK PHOS: 71 U/L (ref 38–126)
ALT: 21 U/L (ref 14–54)
ANION GAP: 10 (ref 5–15)
AST: 33 U/L (ref 15–41)
Albumin: 2.2 g/dL — ABNORMAL LOW (ref 3.5–5.0)
BILIRUBIN TOTAL: 1.3 mg/dL — AB (ref 0.3–1.2)
BUN: 21 mg/dL — ABNORMAL HIGH (ref 6–20)
CALCIUM: 6.8 mg/dL — AB (ref 8.9–10.3)
CO2: 16 mmol/L — ABNORMAL LOW (ref 22–32)
Chloride: 110 mmol/L (ref 101–111)
Creatinine, Ser: 0.67 mg/dL (ref 0.44–1.00)
GFR calc Af Amer: >60 mL/min (ref >60)
Glucose, Bld: 99 mg/dL (ref 65–99)
Potassium: 3.3 mmol/L — ABNORMAL LOW (ref 3.5–5.1)
SODIUM: 136 mmol/L (ref 135–145)
TOTAL PROTEIN: 5.3 g/dL — AB (ref 6.5–8.1)

## 2016-10-30 LAB — BASIC METABOLIC PANEL
ANION GAP: 9 (ref 5–15)
BUN: 20 mg/dL (ref 6–20)
CALCIUM: 6.9 mg/dL — AB (ref 8.9–10.3)
CO2: 17 mmol/L — AB (ref 22–32)
Chloride: 108 mmol/L (ref 101–111)
Creatinine, Ser: 0.62 mg/dL (ref 0.44–1.00)
GFR calc Af Amer: >60 mL/min (ref >60)
GFR calc non Af Amer: >60 mL/min (ref >60)
GLUCOSE: 113 mg/dL — AB (ref 65–99)
Potassium: 3 mmol/L — ABNORMAL LOW (ref 3.5–5.1)
Sodium: 134 mmol/L — ABNORMAL LOW (ref 135–145)

## 2016-10-30 LAB — CBC
HCT: 36.4 % (ref 36.0–46.0)
HEMATOCRIT: 37.3 % (ref 36.0–46.0)
HEMOGLOBIN: 12.4 g/dL (ref 12.0–15.0)
Hemoglobin: 12.1 g/dL (ref 12.0–15.0)
MCH: 29.6 pg (ref 26.0–34.0)
MCH: 29.7 pg (ref 26.0–34.0)
MCHC: 33.2 g/dL (ref 30.0–36.0)
MCHC: 33.2 g/dL (ref 30.0–36.0)
MCV: 89 fL (ref 78.0–100.0)
MCV: 89.4 fL (ref 78.0–100.0)
Platelets: 203 10*3/uL (ref 150–400)
Platelets: 194 10*3/uL (ref 150–400)
RBC: 4.09 MIL/uL (ref 3.87–5.11)
RBC: 4.17 MIL/uL (ref 3.87–5.11)
RDW: 17.8 % — AB (ref 11.5–15.5)
RDW: 17.8 % — ABNORMAL HIGH (ref 11.5–15.5)
WBC: 10.1 10*3/uL (ref 4.0–10.5)
WBC: 8.9 10*3/uL (ref 4.0–10.5)

## 2016-10-30 LAB — MRSA CULTURE: CULTURE: NOT DETECTED

## 2016-10-30 LAB — HEPARIN LEVEL (UNFRACTIONATED)
HEPARIN UNFRACTIONATED: 0.24 [IU]/mL — AB (ref 0.30–0.70)
Heparin Unfractionated: 0.37 IU/mL (ref 0.30–0.70)

## 2016-10-30 LAB — CK: CK TOTAL: 264 U/L — AB (ref 38–234)

## 2016-10-30 LAB — ECHOCARDIOGRAM COMPLETE
HEIGHTINCHES: 62 in
WEIGHTICAEL: 2316.8 [oz_av]

## 2016-10-30 LAB — PHOSPHORUS
PHOSPHORUS: 1.5 mg/dL — AB (ref 2.5–4.6)
Phosphorus: 1.5 mg/dL — ABNORMAL LOW (ref 2.5–4.6)

## 2016-10-30 LAB — TROPONIN I: TROPONIN I: 0.13 ng/mL — AB (ref <0.03)

## 2016-10-30 LAB — PROCALCITONIN: PROCALCITONIN: 2.77 ng/mL

## 2016-10-30 MED ORDER — VANCOMYCIN HCL 500 MG IV SOLR
500.0000 mg | Freq: Two times a day (BID) | INTRAVENOUS | Status: DC
  Administered 2016-10-30 – 2016-10-31 (×2): 500 mg via INTRAVENOUS
  Filled 2016-10-30 (×3): qty 500

## 2016-10-30 MED ORDER — AMIODARONE HCL IN DEXTROSE 360-4.14 MG/200ML-% IV SOLN
60.0000 mg/h | INTRAVENOUS | Status: DC
  Administered 2016-10-30 (×2): 60 mg/h via INTRAVENOUS
  Filled 2016-10-30 (×2): qty 200

## 2016-10-30 MED ORDER — AMIODARONE LOAD VIA INFUSION
150.0000 mg | Freq: Once | INTRAVENOUS | Status: AC
  Administered 2016-10-30: 150 mg via INTRAVENOUS
  Filled 2016-10-30: qty 83.34

## 2016-10-30 MED ORDER — POTASSIUM CHLORIDE 10 MEQ/100ML IV SOLN
INTRAVENOUS | Status: AC
  Administered 2016-10-30: 10 meq
  Filled 2016-10-30: qty 100

## 2016-10-30 MED ORDER — METOPROLOL TARTRATE 12.5 MG HALF TABLET
12.5000 mg | ORAL_TABLET | Freq: Two times a day (BID) | ORAL | Status: DC
  Administered 2016-10-30 (×2): 12.5 mg via ORAL
  Filled 2016-10-30 (×2): qty 1

## 2016-10-30 MED ORDER — POTASSIUM CHLORIDE 10 MEQ/100ML IV SOLN
10.0000 meq | INTRAVENOUS | Status: AC
  Administered 2016-10-30 (×3): 10 meq via INTRAVENOUS
  Filled 2016-10-30 (×3): qty 100

## 2016-10-30 MED ORDER — PIPERACILLIN-TAZOBACTAM 3.375 G IVPB
3.3750 g | Freq: Three times a day (TID) | INTRAVENOUS | Status: DC
  Administered 2016-10-30 – 2016-11-01 (×6): 3.375 g via INTRAVENOUS
  Filled 2016-10-30 (×8): qty 50

## 2016-10-30 MED ORDER — FUROSEMIDE 10 MG/ML IJ SOLN
40.0000 mg | Freq: Once | INTRAMUSCULAR | Status: AC
  Administered 2016-10-30: 40 mg via INTRAVENOUS
  Filled 2016-10-30: qty 4

## 2016-10-30 MED ORDER — ORAL CARE MOUTH RINSE
15.0000 mL | Freq: Two times a day (BID) | OROMUCOSAL | Status: DC
  Administered 2016-10-30 – 2016-11-04 (×11): 15 mL via OROMUCOSAL

## 2016-10-30 MED ORDER — MAGNESIUM SULFATE 2 GM/50ML IV SOLN
2.0000 g | Freq: Once | INTRAVENOUS | Status: AC
  Administered 2016-10-30: 2 g via INTRAVENOUS
  Filled 2016-10-30: qty 50

## 2016-10-30 MED ORDER — SODIUM PHOSPHATES 45 MMOLE/15ML IV SOLN
30.0000 mmol | Freq: Once | INTRAVENOUS | Status: AC
  Administered 2016-10-30: 30 mmol via INTRAVENOUS
  Filled 2016-10-30: qty 10

## 2016-10-30 MED ORDER — POTASSIUM CHLORIDE 10 MEQ/100ML IV SOLN
10.0000 meq | INTRAVENOUS | Status: AC
  Administered 2016-10-30 (×3): 10 meq via INTRAVENOUS
  Filled 2016-10-30 (×2): qty 100

## 2016-10-30 MED ORDER — AMIODARONE HCL IN DEXTROSE 360-4.14 MG/200ML-% IV SOLN
30.0000 mg/h | INTRAVENOUS | Status: DC
  Administered 2016-10-30 – 2016-10-31 (×2): 30 mg/h via INTRAVENOUS
  Filled 2016-10-30 (×2): qty 200

## 2016-10-30 NOTE — Progress Notes (Signed)
Echocardiogram 2D Echocardiogram has been performed.  Haley Tucker 10/30/2016, 1:10 PM

## 2016-10-30 NOTE — Progress Notes (Signed)
ANTICOAGULATION CONSULT NOTE  Pharmacy Consult for heparin Indication: atrial fibrillation  No Known Allergies  Patient Measurements: Height: 5\' 2"  (157.5 cm) Weight: 144 lb 12.8 oz (65.7 kg) IBW/kg (Calculated) : 50.1 Heparin Dosing Weight: 63 kg  Vital Signs: Temp: 98.8 F (37.1 C) (06/30 1200) Temp Source: Oral (06/30 1200) BP: 129/52 (06/30 1200) Pulse Rate: 53 (06/30 0500)  Labs:  Recent Labs  10/28/16 1655  10/28/16 2320 10/29/16 0705  10/29/16 1226 10/29/16 1355 10/29/16 1741 10/30/16 0441 10/30/16 0926 10/30/16 1208 10/30/16 1339  HGB 14.6  --   --  12.8  --  14.0  --   --  12.1 12.4  --   --   HCT 43.8  --   --  39.5  --  41.7  --   --  36.4 37.3  --   --   PLT 218  --   --  204  --  196  --   --  203 194  --   --   APTT  --   --  30  --   --   --   --   --   --   --   --   --   LABPROT 20.5*  --   --   --   --   --   --   --   --   --   --   --   INR 1.73  --   --   --   --   --   --   --   --   --   --   --   HEPARINUNFRC  --   < > <0.10* <0.10*  --   --   --  <0.10* 0.24*  --   --  0.37  CREATININE 1.23*  --   --  0.88  < >  --  0.Haley  --   --  0.62 0.67  --   CKTOTAL 743*  --   --   --   --   --   --   --  264*  --   --   --   TROPONINI  --   < > 0.12* 0.07*  0.13*  --  0.14*  --   --  0.13*  --   --   --   < > = values in this interval not displayed.  Estimated Creatinine Clearance: 49.8 mL/min (by C-G formula based on SCr of 0.67 mg/dL).  Assessment: Haley Tucker brought in on 6/28 with AMS after being found down. Pharmacy has been consulted to dose heparin for Afib.   Heparin level this afternoon remains therapeutic (HL 0.37, goal of 0.3-0.7). CBC wnl and stable. No overt s/sx of bleeding are noted at this time.   Goal of Therapy:  Heparin level 0.3-0.7 units/ml Monitor platelets by anticoagulation protocol: Yes   Plan:  1. Continue Heparin at 1250 units/hr (12.5 ml/hr) 2. Will continue to monitor for any signs/symptoms of bleeding and will follow  up with heparin level in 8 hours to confirm therapeutic  Thank you for allowing pharmacy to be a part of this patient's care.  7/28, PharmD, BCPS Clinical Pharmacist Clinical phone for 10/30/2016 from 7a-3:30p: [phone] If after 3:30p, please call main pharmacy at: [phone] 10/30/2016 2:31 PM

## 2016-10-30 NOTE — Progress Notes (Signed)
Called around 10 pm on 6/29 that patient in afib with RVR rate 140-150 per EKG. Given metoprolol 2.5 mg IV with no improvement in rate or rhythm. Ordered additional 2.5 mg IV metoprolol with conversion to NSR. This only lasted around 30 minutes when telemetry again evidence afib with rate 130's. Ordered cardizem load and to start cardizem gtt.   On evaluation of patient at bedside: General : lethargic, did not appear to respond to verbal stimuli. Heart: irregular rate, tachycardic rhythm. Telemetry showing afib with rate ranging from 78 - 118 with 2-4 min pauses.  Impression and Plan  Afib with RVR On cardizem gtt. Continue to titrate and monitor rate/rhythm. Continue Heparin gtt. Re-evaluate troponin.

## 2016-10-30 NOTE — Progress Notes (Signed)
Pt. currently in Afib w/ HR 100s-120s receiving amiodarone at 60mg /hr, which was started at 0651. Before amiodarone was started the pt. was receiving cardizem at the rate of 17.5mg /hr with no rate control. While on cardizem the pt.'s HR would run anywhere from the 90s to 140s and occasionally go brady in the 20s-50s. The pt. was also experiencing occasional to frequent pauses ranging from 2.9 seconds to 3.14 seconds while on the cardizem gtt.   Originally the cardizem order had a max titration rate of 15mg /hr. , NP was notified once the original max dose was reached and the pt. was still not rate controlled. The cardizem orders were then adjusted to a max dose of 20mg /hr. Once the pt. was adjusted to 17.5mg /hr and still not rate controlled Lynch, NP decided to switch to amiodarone in hopes of better reaching the HR goal.   Pt. remained asymptomatic throughout the night and is now A&O to herself and place.

## 2016-10-30 NOTE — Consult Note (Signed)
CONSULTATION NOTE   Patient Name: Haley Tucker Date of Encounter: 10/30/2016 Cardiologist: None (NEW)  Chief Complaint   I feel tired  Hospital Problem List   Principal Problem:   Acute metabolic encephalopathy Active Problems:   Severe sepsis ([place])   Acute cystitis with hematuria   Atrial fibrillation with RVR ([facility])   Rhabdomyolysis   Acute diastolic (congestive) heart failure ([place])    HPI   Haley Tucker is a 83 y.o. female who is being seen today for the evaluation of a-fib at the request of Dr. Wynelle Cleveland. She has a History of rheumatoid arthritis, A. fib in the past, hypertension and recurrent urinary tract infections. She presented to [place] after her family had not been able to contact her for several days. She apparently lives at home and they called EMS. She is found to be altered when EMS arrived and they brought her to the emergency department. She was hypotensive, hypoxemic and in A. fib with RVR. White blood cell count was elevated at 17,000 and CK was 743 with a lactic acid of 3.93. She was given almost 4 L of IV fluid. UA was abnormal for many bacteria and nitrite positive and she was treated for a urinary tract infection with antibiotics. According to the ER doctors it's unclear whether her A. fib was new or old however she was rate controlled. She was placed on heparin and cardiology consultation was requested. Troponin was noted to be mildly elevated at 0.1, thought to be due to demand ischemia. This remained slightly elevated 0.14 and 0.13. An echocardiogram was performed today which showed an EF of 65-70%, moderate LVH and no regional wall motion abnormalities. There is mild to moderate mitral regurgitation, moderate tricuspid regurgitation and RVSP of 31 mmHg. There are bilateral pleural effusions but no pericardial effusion. The RV was dilated.  Today she reports feeling tired. She denies any chest pain or worsening shortness of breath although does seem to be  labored somewhat with her breathing. Telemetry was personally reviewed and shows A. fib with RVR.  PMHx   No past medical history on file.  Past Surgical History:  Procedure Laterality Date  . CHOLECYSTECTOMY     per abdominal CT    FAMHx   Family History  Problem Relation Age of Onset  . Family history unknown: Yes    SOCHx    has no tobacco, alcohol, and drug history on file.  Outpatient Medications   No current facility-administered medications on file prior to encounter.    No current outpatient prescriptions on file prior to encounter.    Inpatient Medications    Scheduled Meds: . mouth rinse  15 mL Mouth Rinse BID  . pantoprazole (PROTONIX) IV  40 mg Intravenous QHS    Continuous Infusions: . sodium chloride 250 mL (10/28/16 2338)  . amiodarone 30 mg/hr (10/30/16 1205)  . heparin 1,250 Units/hr (10/30/16 0555)  . piperacillin-tazobactam (ZOSYN)  IV 3.375 g (10/30/16 1431)  . potassium chloride 10 mEq (10/30/16 1315)  . sodium phosphate  Dextrose 5% IVPB    . vancomycin 500 mg (10/30/16 1422)    PRN Meds: sodium chloride, metoprolol tartrate   ALLERGIES   No Known Allergies  ROS   Review of systems not obtained due to patient factors.  Vitals   Vitals:   10/30/16 0500 10/30/16 0830 10/30/16 1030 10/30/16 1200  BP: 128/68 (!) 116/103 (!) 139/50 (!) 129/52  Pulse: (!) 53     Resp: (!) 29   Marland Kitchen)  29  Temp:  98.1 F (36.7 C)  98.8 F (37.1 C)  TempSrc:  Oral  Oral  SpO2: 97%     Weight:      Height:        Intake/Output Summary (Last 24 hours) at 10/30/16 1449 Last data filed at 10/30/16 8786  Gross per 24 hour  Intake          1774.27 ml  Output              475 ml  Net          1299.27 ml   Filed Weights   10/28/16 1646 10/29/16 0015 10/30/16 0400  Weight: 120 lb (54.4 kg) 138 lb (62.6 kg) 144 lb 12.8 oz (65.7 kg)    Physical Exam   General appearance: alert, appears older than stated age and mild distress Neck: no carotid  bruit and no JVD Lungs: diminished breath sounds bibasilar Heart: irregularly irregular rhythm and systolic murmur: early systolic 3/6, blowing at lower left sternal border Abdomen: soft, non-tender; bowel sounds normal; no masses,  no organomegaly Extremities: extremities normal, atraumatic, no cyanosis or edema Pulses: 2+ and symmetric Skin: Pale, warm, dry Neurologic: Mental status: Alert, oriented to place, year, state Psych: Pleasant  Labs   Results for orders placed or performed during the hospital encounter of 10/28/16 (from the past 48 hour(s))  Comprehensive metabolic panel     Status: Abnormal   Collection Time: 10/28/16  4:55 PM  Result Value Ref Range   Sodium 134 (L) 135 - 145 mmol/L   Potassium 4.0 3.5 - 5.1 mmol/L   Chloride 103 101 - 111 mmol/L   CO2 18 (L) 22 - 32 mmol/L   Glucose, Bld 84 65 - 99 mg/dL   BUN 36 (H) 6 - 20 mg/dL   Creatinine, Ser 1.23 (H) 0.44 - 1.00 mg/dL   Calcium 7.9 (L) 8.9 - 10.3 mg/dL   Total Protein 6.2 (L) 6.5 - 8.1 g/dL   Albumin 2.8 (L) 3.5 - 5.0 g/dL   AST 55 (H) 15 - 41 U/L   ALT 38 14 - 54 U/L   Alkaline Phosphatase 81 38 - 126 U/L   Total Bilirubin 1.8 (H) 0.3 - 1.2 mg/dL   GFR calc non Af Amer 40 (L) >60 mL/min   GFR calc Af Amer 47 (L) >60 mL/min    Comment: (NOTE) The eGFR has been calculated using the CKD EPI equation. This calculation has not been validated in all clinical situations. eGFR's persistently <60 mL/min signify possible Chronic Kidney Disease.    Anion gap 13 5 - 15  CBC with Differential     Status: Abnormal   Collection Time: 10/28/16  4:55 PM  Result Value Ref Range   WBC 17.2 (H) 4.0 - 10.5 K/uL   RBC 4.82 3.87 - 5.11 MIL/uL   Hemoglobin 14.6 12.0 - 15.0 g/dL   HCT 43.8 36.0 - 46.0 %   MCV 90.9 78.0 - 100.0 fL   MCH 30.3 26.0 - 34.0 pg   MCHC 33.3 30.0 - 36.0 g/dL   RDW 18.3 (H) 11.5 - 15.5 %   Platelets 218 150 - 400 K/uL   Neutrophils Relative % 83 %   Lymphocytes Relative 6 %   Monocytes  Relative 11 %   Eosinophils Relative 0 %   Basophils Relative 0 %   Neutro Abs 14.3 (H) 1.7 - 7.7 K/uL   Lymphs Abs 1.0 0.7 - 4.0 K/uL   Monocytes  Absolute 1.9 (H) 0.1 - 1.0 K/uL   Eosinophils Absolute 0.0 0.0 - 0.7 K/uL   Basophils Absolute 0.0 0.0 - 0.1 K/uL   Smear Review MORPHOLOGY UNREMARKABLE   Protime-INR     Status: Abnormal   Collection Time: 10/28/16  4:55 PM  Result Value Ref Range   Prothrombin Time 20.5 (H) 11.4 - 15.2 seconds   INR 1.73   Culture, blood (Routine x 2)     Status: None (Preliminary result)   Collection Time: 10/28/16  4:55 PM  Result Value Ref Range   Specimen Description BLOOD LEFT ANTECUBITAL    Special Requests      BOTTLES DRAWN AEROBIC AND ANAEROBIC Blood Culture adequate volume   Culture NO GROWTH < 24 HOURS    Report Status PENDING   Lipase, blood     Status: None   Collection Time: 10/28/16  4:55 PM  Result Value Ref Range   Lipase 27 11 - 51 U/L  CK     Status: Abnormal   Collection Time: 10/28/16  4:55 PM  Result Value Ref Range   Total CK 743 (H) 38 - 234 U/L  Culture, blood (Routine x 2)     Status: None (Preliminary result)   Collection Time: 10/28/16  5:00 PM  Result Value Ref Range   Specimen Description BLOOD RIGHT ANTECUBITAL    Special Requests      BOTTLES DRAWN AEROBIC AND ANAEROBIC Blood Culture adequate volume   Culture NO GROWTH < 24 HOURS    Report Status PENDING   I-Stat Troponin, ED (not at [facility])     Status: None   Collection Time: 10/28/16  5:08 PM  Result Value Ref Range   Troponin i, poc 0.06 0.00 - 0.08 ng/mL   Comment 3            Comment: Due to the release kinetics of cTnI, a negative result within the first hours of the onset of symptoms does not rule out myocardial infarction with certainty. If myocardial infarction is still suspected, repeat the test at appropriate intervals.   I-Stat CG4 Lactic Acid, ED     Status: Abnormal   Collection Time: 10/28/16  5:10 PM  Result Value Ref Range   Lactic Acid,  Venous 3.93 (HH) 0.5 - 1.9 mmol/L  Urinalysis, Routine w reflex microscopic     Status: Abnormal   Collection Time: 10/28/16  6:04 PM  Result Value Ref Range   Color, Urine AMBER (A) YELLOW    Comment: BIOCHEMICALS MAY BE AFFECTED BY COLOR   APPearance CLOUDY (A) CLEAR   Specific Gravity, Urine 1.017 1.005 - 1.030   pH 5.0 5.0 - 8.0   Glucose, UA NEGATIVE NEGATIVE mg/dL   Hgb urine dipstick MODERATE (A) NEGATIVE   Bilirubin Urine NEGATIVE NEGATIVE   Ketones, ur 80 (A) NEGATIVE mg/dL   Protein, ur 30 (A) NEGATIVE mg/dL   Nitrite POSITIVE (A) NEGATIVE   Leukocytes, UA LARGE (A) NEGATIVE   RBC / HPF 0-5 0 - 5 RBC/hpf   WBC, UA TOO NUMEROUS TO COUNT 0 - 5 WBC/hpf   Bacteria, UA MANY (A) NONE SEEN   Squamous Epithelial / LPF 0-5 (A) NONE SEEN   Mucous PRESENT    Non Squamous Epithelial 0-5 (A) NONE SEEN  I-Stat Troponin, ED (not at [facility])     Status: Abnormal   Collection Time: 10/28/16  7:38 PM  Result Value Ref Range   Troponin i, poc 0.10 (HH) 0.00 - 0.08 ng/mL  Comment NOTIFIED PHYSICIAN    Comment 3            Comment: Due to the release kinetics of cTnI, a negative result within the first hours of the onset of symptoms does not rule out myocardial infarction with certainty. If myocardial infarction is still suspected, repeat the test at appropriate intervals.   I-Stat CG4 Lactic Acid, ED     Status: Abnormal   Collection Time: 10/28/16  7:40 PM  Result Value Ref Range   Lactic Acid, Venous 3.80 (HH) 0.5 - 1.9 mmol/L   Comment NOTIFIED PHYSICIAN   Magnesium     Status: None   Collection Time: 10/28/16  8:48 PM  Result Value Ref Range   Magnesium 1.7 1.7 - 2.4 mg/dL  MRSA PCR Screening     Status: Abnormal   Collection Time: 10/28/16 10:57 PM  Result Value Ref Range   MRSA by PCR INVALID RESULTS, SPECIMEN SENT FOR CULTURE (A) NEGATIVE    Comment: J.WOODY,RN AT 0330 BY L.PITT 10/29/16        The GeneXpert MRSA Assay (FDA approved for NASAL specimens only), is one  component of a comprehensive MRSA colonization surveillance program. It is not intended to diagnose MRSA infection nor to guide or monitor treatment for MRSA infections.   MRSA culture     Status: None   Collection Time: 10/28/16 10:57 PM  Result Value Ref Range   Specimen Description NASAL SWAB    Special Requests NONE    Culture NO MRSA DETECTED    Report Status 10/30/2016 FINAL   Phosphorus     Status: Abnormal   Collection Time: 10/28/16 11:20 PM  Result Value Ref Range   Phosphorus 2.1 (L) 2.5 - 4.6 mg/dL  Cortisol     Status: None   Collection Time: 10/28/16 11:20 PM  Result Value Ref Range   Cortisol, Plasma 15.6 ug/dL    Comment: (NOTE) AM    6.7 - 22.6 ug/dL PM   <10.0       ug/dL   Lactic acid, plasma     Status: Abnormal   Collection Time: 10/28/16 11:20 PM  Result Value Ref Range   Lactic Acid, Venous 3.3 (HH) 0.5 - 1.9 mmol/L    Comment: CRITICAL RESULT CALLED TO, READ BACK BY AND VERIFIED WITH: TERRY,J RN 10/29/2016 0025 JORDANS   Troponin I     Status: Abnormal   Collection Time: 10/28/16 11:20 PM  Result Value Ref Range   Troponin I 0.12 (HH) <0.03 ng/mL    Comment: CRITICAL RESULT CALLED TO, READ BACK BY AND VERIFIED WITH: CUMMINGS,B RN 10/29/2016 0055 JORDANS   APTT     Status: None   Collection Time: 10/28/16 11:20 PM  Result Value Ref Range   aPTT 30 24 - 36 seconds  Heparin level (unfractionated)     Status: Abnormal   Collection Time: 10/28/16 11:20 PM  Result Value Ref Range   Heparin Unfractionated <0.10 (L) 0.30 - 0.70 IU/mL    Comment:        IF HEPARIN RESULTS ARE BELOW EXPECTED VALUES, AND PATIENT DOSAGE HAS BEEN CONFIRMED, SUGGEST FOLLOW UP TESTING OF ANTITHROMBIN III LEVELS.   Procalcitonin - Baseline     Status: None   Collection Time: 10/28/16 11:20 PM  Result Value Ref Range   Procalcitonin 5.34 ng/mL    Comment:        Interpretation: PCT > 2 ng/mL: Systemic infection (sepsis) is likely, unless other causes are  known. (NOTE)  ICU PCT Algorithm               Non ICU PCT Algorithm    ----------------------------     ------------------------------         PCT < 0.25 ng/mL                 PCT < 0.1 ng/mL     Stopping of antibiotics            Stopping of antibiotics       strongly encouraged.               strongly encouraged.    ----------------------------     ------------------------------       PCT level decrease by               PCT < 0.25 ng/mL       >= 80% from peak PCT       OR PCT 0.25 - 0.5 ng/mL          Stopping of antibiotics                                             encouraged.     Stopping of antibiotics           encouraged.    ----------------------------     ------------------------------       PCT level decrease by              PCT >= 0.25 ng/mL       < 80% from peak PCT        AND PCT >= 0.5 ng/mL            Continuing antibiotics                                               encouraged.       Continuing antibiotics            encouraged.    ----------------------------     ------------------------------     PCT level increase compared          PCT > 0.5 ng/mL         with peak PCT AND          PCT >= 0.5 ng/mL             Escalation of antibiotics                                          strongly encouraged.      Escalation of antibiotics        strongly encouraged.   Lactic acid, plasma     Status: None   Collection Time: 10/29/16  3:02 AM  Result Value Ref Range   Lactic Acid, Venous 1.7 0.5 - 1.9 mmol/L  CBC     Status: Abnormal   Collection Time: 10/29/16  7:05 AM  Result Value Ref Range   WBC 11.0 (H) 4.0 - 10.5 K/uL   RBC 4.35 3.87 - 5.11 MIL/uL   Hemoglobin 12.8 12.0 - 15.0 g/dL   HCT 39.5 36.0 - 46.0 %  MCV 90.8 78.0 - 100.0 fL   MCH 29.4 26.0 - 34.0 pg   MCHC 32.4 30.0 - 36.0 g/dL   RDW 18.4 (H) 11.5 - 15.5 %   Platelets 204 150 - 400 K/uL  Basic metabolic panel     Status: Abnormal   Collection Time: 10/29/16  7:05 AM  Result Value Ref  Range   Sodium 134 (L) 135 - 145 mmol/L   Potassium 3.6 3.5 - 5.1 mmol/L   Chloride 105 101 - 111 mmol/L   CO2 22 22 - 32 mmol/L   Glucose, Bld 123 (H) 65 - 99 mg/dL   BUN 24 (H) 6 - 20 mg/dL   Creatinine, Ser 0.88 0.44 - 1.00 mg/dL   Calcium 7.2 (L) 8.9 - 10.3 mg/dL   GFR calc non Af Amer >60 >60 mL/min   GFR calc Af Amer >60 >60 mL/min    Comment: (NOTE) The eGFR has been calculated using the CKD EPI equation. This calculation has not been validated in all clinical situations. eGFR's persistently <60 mL/min signify possible Chronic Kidney Disease.    Anion gap 7 5 - 15  Magnesium     Status: None   Collection Time: 10/29/16  7:05 AM  Result Value Ref Range   Magnesium 2.0 1.7 - 2.4 mg/dL  Phosphorus     Status: Abnormal   Collection Time: 10/29/16  7:05 AM  Result Value Ref Range   Phosphorus 2.0 (L) 2.5 - 4.6 mg/dL  Troponin I     Status: Abnormal   Collection Time: 10/29/16  7:05 AM  Result Value Ref Range   Troponin I 0.07 (HH) <0.03 ng/mL    Comment: RESPUN REPEATED TO VERIFY CRITICAL VALUE NOTED.  VALUE IS CONSISTENT WITH PREVIOUSLY REPORTED AND CALLED VALUE.   Hepatic function panel     Status: Abnormal   Collection Time: 10/29/16  7:05 AM  Result Value Ref Range   Total Protein 5.3 (L) 6.5 - 8.1 g/dL   Albumin 2.3 (L) 3.5 - 5.0 g/dL   AST 33 15 - 41 U/L   ALT 27 14 - 54 U/L   Alkaline Phosphatase 64 38 - 126 U/L   Total Bilirubin 1.6 (H) 0.3 - 1.2 mg/dL   Bilirubin, Direct 0.5 0.1 - 0.5 mg/dL   Indirect Bilirubin 1.1 (H) 0.3 - 0.9 mg/dL  Troponin I (q 6hr x 3)     Status: Abnormal   Collection Time: 10/29/16  7:05 AM  Result Value Ref Range   Troponin I 0.13 (HH) <0.03 ng/mL    Comment: RESPUN REPEATED TO VERIFY CORRECTED RESULTS CALLED TO: A.DAVIS,RN 1003 10/29/16 CLARK,S CRITICAL VALUE NOTED.  VALUE IS CONSISTENT WITH PREVIOUSLY REPORTED AND CALLED VALUE. CORRECTED ON 06/29 AT 0958: PREVIOUSLY REPORTED AS 0.35 CRITICAL VALUE NOTED.  VALUE IS CONSISTENT  WITH PREVIOUSLY REPORTED AND CALLED VALUE.   Heparin level (unfractionated)     Status: Abnormal   Collection Time: 10/29/16  7:05 AM  Result Value Ref Range   Heparin Unfractionated <0.10 (L) 0.30 - 0.70 IU/mL    Comment:        IF HEPARIN RESULTS ARE BELOW EXPECTED VALUES, AND PATIENT DOSAGE HAS BEEN CONFIRMED, SUGGEST FOLLOW UP TESTING OF ANTITHROMBIN III LEVELS. REPEATED TO VERIFY SPECIMEN CHECKED FOR CLOTS   Basic metabolic panel     Status: Abnormal   Collection Time: 10/29/16  7:40 AM  Result Value Ref Range   Sodium 133 (L) 135 - 145 mmol/L   Potassium 3.7 3.5 -  5.1 mmol/L   Chloride 105 101 - 111 mmol/L   CO2 21 (L) 22 - 32 mmol/L   Glucose, Bld 119 (H) 65 - 99 mg/dL   BUN 24 (H) 6 - 20 mg/dL   Creatinine, Ser 0.87 0.44 - 1.00 mg/dL   Calcium 7.2 (L) 8.9 - 10.3 mg/dL   GFR calc non Af Amer >60 >60 mL/min   GFR calc Af Amer >60 >60 mL/min    Comment: (NOTE) The eGFR has been calculated using the CKD EPI equation. This calculation has not been validated in all clinical situations. eGFR's persistently <60 mL/min signify possible Chronic Kidney Disease.    Anion gap 7 5 - 15  Troponin I     Status: Abnormal   Collection Time: 10/29/16 12:26 PM  Result Value Ref Range   Troponin I 0.14 (HH) <0.03 ng/mL    Comment: CRITICAL VALUE NOTED.  VALUE IS CONSISTENT WITH PREVIOUSLY REPORTED AND CALLED VALUE.  CBC     Status: Abnormal   Collection Time: 10/29/16 12:26 PM  Result Value Ref Range   WBC 10.6 (H) 4.0 - 10.5 K/uL   RBC 4.62 3.87 - 5.11 MIL/uL   Hemoglobin 14.0 12.0 - 15.0 g/dL   HCT 41.7 36.0 - 46.0 %   MCV 90.3 78.0 - 100.0 fL   MCH 30.3 26.0 - 34.0 pg   MCHC 33.6 30.0 - 36.0 g/dL   RDW 18.0 (H) 11.5 - 15.5 %   Platelets 196 150 - 400 K/uL  TSH     Status: None   Collection Time: 10/29/16  1:55 PM  Result Value Ref Range   TSH 1.306 0.350 - 4.500 uIU/mL    Comment: Performed by a 3rd Generation assay with a functional sensitivity of <=0.01 uIU/mL.  T4,  free     Status: None   Collection Time: 10/29/16  1:55 PM  Result Value Ref Range   Free T4 0.94 0.61 - 1.12 ng/dL    Comment: (NOTE) Biotin ingestion may interfere with free T4 tests. If the results are inconsistent with the TSH level, previous test results, or the clinical presentation, then consider biotin interference. If needed, order repeat testing after stopping biotin.   Comprehensive metabolic panel     Status: Abnormal   Collection Time: 10/29/16  1:55 PM  Result Value Ref Range   Sodium 135 135 - 145 mmol/L   Potassium 3.7 3.5 - 5.1 mmol/L   Chloride 108 101 - 111 mmol/L   CO2 17 (L) 22 - 32 mmol/L   Glucose, Bld 89 65 - 99 mg/dL   BUN 24 (H) 6 - 20 mg/dL   Creatinine, Ser 0.80 0.44 - 1.00 mg/dL   Calcium 7.6 (L) 8.9 - 10.3 mg/dL   Total Protein 5.9 (L) 6.5 - 8.1 g/dL   Albumin 2.5 (L) 3.5 - 5.0 g/dL   AST 35 15 - 41 U/L   ALT 26 14 - 54 U/L   Alkaline Phosphatase 75 38 - 126 U/L   Total Bilirubin 1.5 (H) 0.3 - 1.2 mg/dL   GFR calc non Af Amer >60 >60 mL/min   GFR calc Af Amer >60 >60 mL/min    Comment: (NOTE) The eGFR has been calculated using the CKD EPI equation. This calculation has not been validated in all clinical situations. eGFR's persistently <60 mL/min signify possible Chronic Kidney Disease.    Anion gap 10 5 - 15  Heparin level (unfractionated)     Status: Abnormal   Collection Time:  10/29/16  5:41 PM  Result Value Ref Range   Heparin Unfractionated <0.10 (L) 0.30 - 0.70 IU/mL    Comment:        IF HEPARIN RESULTS ARE BELOW EXPECTED VALUES, AND PATIENT DOSAGE HAS BEEN CONFIRMED, SUGGEST FOLLOW UP TESTING OF ANTITHROMBIN III LEVELS.   CBC     Status: Abnormal   Collection Time: 10/30/16  4:41 AM  Result Value Ref Range   WBC 10.1 4.0 - 10.5 K/uL   RBC 4.09 3.87 - 5.11 MIL/uL   Hemoglobin 12.1 12.0 - 15.0 g/dL   HCT 36.4 36.0 - 46.0 %   MCV 89.0 78.0 - 100.0 fL   MCH 29.6 26.0 - 34.0 pg   MCHC 33.2 30.0 - 36.0 g/dL   RDW 17.8 (H) 11.5 -  15.5 %   Platelets 203 150 - 400 K/uL  Procalcitonin     Status: None   Collection Time: 10/30/16  4:41 AM  Result Value Ref Range   Procalcitonin 2.77 ng/mL    Comment:        Interpretation: PCT > 2 ng/mL: Systemic infection (sepsis) is likely, unless other causes are known. (NOTE)         ICU PCT Algorithm               Non ICU PCT Algorithm    ----------------------------     ------------------------------         PCT < 0.25 ng/mL                 PCT < 0.1 ng/mL     Stopping of antibiotics            Stopping of antibiotics       strongly encouraged.               strongly encouraged.    ----------------------------     ------------------------------       PCT level decrease by               PCT < 0.25 ng/mL       >= 80% from peak PCT       OR PCT 0.25 - 0.5 ng/mL          Stopping of antibiotics                                             encouraged.     Stopping of antibiotics           encouraged.    ----------------------------     ------------------------------       PCT level decrease by              PCT >= 0.25 ng/mL       < 80% from peak PCT        AND PCT >= 0.5 ng/mL            Continuing antibiotics                                               encouraged.       Continuing antibiotics            encouraged.    ----------------------------     ------------------------------  PCT level increase compared          PCT > 0.5 ng/mL         with peak PCT AND          PCT >= 0.5 ng/mL             Escalation of antibiotics                                          strongly encouraged.      Escalation of antibiotics        strongly encouraged.   CK     Status: Abnormal   Collection Time: 10/30/16  4:41 AM  Result Value Ref Range   Total CK 264 (H) 38 - 234 U/L  Phosphorus     Status: Abnormal   Collection Time: 10/30/16  4:41 AM  Result Value Ref Range   Phosphorus 1.5 (L) 2.5 - 4.6 mg/dL  Heparin level (unfractionated)     Status: Abnormal   Collection Time:  10/30/16  4:41 AM  Result Value Ref Range   Heparin Unfractionated 0.24 (L) 0.30 - 0.70 IU/mL    Comment:        IF HEPARIN RESULTS ARE BELOW EXPECTED VALUES, AND PATIENT DOSAGE HAS BEEN CONFIRMED, SUGGEST FOLLOW UP TESTING OF ANTITHROMBIN III LEVELS.   Troponin I     Status: Abnormal   Collection Time: 10/30/16  4:41 AM  Result Value Ref Range   Troponin I 0.13 (HH) <0.03 ng/mL    Comment: CRITICAL VALUE NOTED.  VALUE IS CONSISTENT WITH PREVIOUSLY REPORTED AND CALLED VALUE.  Basic metabolic panel     Status: Abnormal   Collection Time: 10/30/16  9:26 AM  Result Value Ref Range   Sodium 134 (L) 135 - 145 mmol/L   Potassium 3.0 (L) 3.5 - 5.1 mmol/L   Chloride 108 101 - 111 mmol/L   CO2 17 (L) 22 - 32 mmol/L   Glucose, Bld 113 (H) 65 - 99 mg/dL   BUN 20 6 - 20 mg/dL   Creatinine, Ser 0.62 0.44 - 1.00 mg/dL   Calcium 6.9 (L) 8.9 - 10.3 mg/dL   GFR calc non Af Amer >60 >60 mL/min   GFR calc Af Amer >60 >60 mL/min    Comment: (NOTE) The eGFR has been calculated using the CKD EPI equation. This calculation has not been validated in all clinical situations. eGFR's persistently <60 mL/min signify possible Chronic Kidney Disease.    Anion gap 9 5 - 15  CBC     Status: Abnormal   Collection Time: 10/30/16  9:26 AM  Result Value Ref Range   WBC 8.9 4.0 - 10.5 K/uL   RBC 4.17 3.87 - 5.11 MIL/uL   Hemoglobin 12.4 12.0 - 15.0 g/dL   HCT 37.3 36.0 - 46.0 %   MCV 89.4 78.0 - 100.0 fL   MCH 29.7 26.0 - 34.0 pg   MCHC 33.2 30.0 - 36.0 g/dL   RDW 17.8 (H) 11.5 - 15.5 %   Platelets 194 150 - 400 K/uL  Comprehensive metabolic panel     Status: Abnormal   Collection Time: 10/30/16 12:08 PM  Result Value Ref Range   Sodium 136 135 - 145 mmol/L   Potassium 3.3 (L) 3.5 - 5.1 mmol/L   Chloride 110 101 - 111 mmol/L   CO2 16 (L) 22 - 32 mmol/L  Glucose, Bld 99 65 - 99 mg/dL   BUN 21 (H) 6 - 20 mg/dL   Creatinine, Ser 0.67 0.44 - 1.00 mg/dL   Calcium 6.8 (L) 8.9 - 10.3 mg/dL   Total  Protein 5.3 (L) 6.5 - 8.1 g/dL   Albumin 2.2 (L) 3.5 - 5.0 g/dL   AST 33 15 - 41 U/L   ALT 21 14 - 54 U/L   Alkaline Phosphatase 71 38 - 126 U/L   Total Bilirubin 1.3 (H) 0.3 - 1.2 mg/dL   GFR calc non Af Amer >60 >60 mL/min   GFR calc Af Amer >60 >60 mL/min    Comment: (NOTE) The eGFR has been calculated using the CKD EPI equation. This calculation has not been validated in all clinical situations. eGFR's persistently <60 mL/min signify possible Chronic Kidney Disease.    Anion gap 10 5 - 15  Phosphorus     Status: Abnormal   Collection Time: 10/30/16 12:08 PM  Result Value Ref Range   Phosphorus 1.5 (L) 2.5 - 4.6 mg/dL  Heparin level (unfractionated)     Status: None   Collection Time: 10/30/16  1:39 PM  Result Value Ref Range   Heparin Unfractionated 0.37 0.30 - 0.70 IU/mL    Comment:        IF HEPARIN RESULTS ARE BELOW EXPECTED VALUES, AND PATIENT DOSAGE HAS BEEN CONFIRMED, SUGGEST FOLLOW UP TESTING OF ANTITHROMBIN III LEVELS.     ECG   A-fib with RVR at 163 (6/28) - Personally Reviewed  Telemetry   A-fib with RVR - Personally Reviewed  Radiology   Dg Chest 2 View  Result Date: 10/28/2016 CLINICAL DATA:  Possible sepsis. EXAM: CHEST  2 VIEW COMPARISON:  None. FINDINGS: The lungs are clear without focal pneumonia, edema, pneumothorax or pleural effusion. Cardiopericardial silhouette is at upper limits of normal for size. The visualized bony structures of the thorax are intact. Telemetry leads overlie the chest. IMPRESSION: No active cardiopulmonary disease. Electronically Signed   By: Misty Stanley M.D.   On: 10/28/2016 17:42   Dg Knee 2 Views Right  Result Date: 10/28/2016 CLINICAL DATA:  83 year old female with possible sepsis. Right knee swelling and warmth. EXAM: RIGHT KNEE - 1-2 VIEW COMPARISON:  [facility] right femur series 12/30/15 and earlier FINDINGS: Partially visible intramedullary rod in the distal right femur with distal interlocking cortical  screw. This hardware appears stable since 2017. No loosening. Superimposed right total knee arthroplasty. The total-knee hardware also appears stable and intact. No definite right knee joint effusion. Osteopenia. No cortical osteolysis identified about the right knee. No acute fracture. No subcutaneous gas. IMPRESSION: Stable radiographic appearance of the distal right femur and right total knee orthopedic hardware since 2017. No acute osseous abnormality identified. Electronically Signed   By: Genevie Ann M.D.   On: 10/28/2016 17:57   Ct Head Wo Contrast  Result Date: 10/28/2016 CLINICAL DATA:  83 year old female with altered mental status. EXAM: CT HEAD WITHOUT CONTRAST TECHNIQUE: Contiguous axial images were obtained from the base of the skull through the vertex without intravenous contrast. COMPARISON:  01/22/2016 and earlier. FINDINGS: Brain: Stable cerebral volume. No midline shift, ventriculomegaly, mass effect, evidence of mass lesion, intracranial hemorrhage or evidence of cortically based acute infarction. Patchy bilateral white matter hypodensity is stable since 2017. No cortical encephalomalacia. Vascular: Calcified atherosclerosis at the skull base. No suspicious intracranial vascular hyperdensity. Skull: Stable.  No acute osseous abnormality identified. Sinuses/Orbits: Stable.  Chronic right maxillary opacification. Other: Stable orbit and scalp  soft tissues. IMPRESSION: No acute intracranial abnormality. Stable non contrast CT appearance of the brain since 2017. Electronically Signed   By: Genevie Ann M.D.   On: 10/28/2016 18:00   Ct Abdomen Pelvis W Contrast  Result Date: 10/28/2016 CLINICAL DATA:  Diffuse abdominal pain with fever EXAM: CT ABDOMEN AND PELVIS WITH CONTRAST TECHNIQUE: Multidetector CT imaging of the abdomen and pelvis was performed using the standard protocol following bolus administration of intravenous contrast. CONTRAST:  145m ISOVUE-300 IOPAMIDOL (ISOVUE-300) INJECTION 61%  COMPARISON:  05/31/2016 FINDINGS: Lower chest: Lung bases demonstrate patchy dependent atelectasis. No pleural effusion or consolidation. Coronary artery calcification. Trace pericardial effusion or thickening. Hepatobiliary: No focal liver abnormality is seen. Status post cholecystectomy. No biliary dilatation. Pancreas: Unremarkable. No pancreatic ductal dilatation or surrounding inflammatory changes. Spleen: Normal in size without focal abnormality. Adrenals/Urinary Tract: Adrenal glands are within normal limits. Subcentimeter hypodense lesions within the kidneys, too small to further characterize. Bladder unremarkable. Stomach/Bowel: Stomach is nonenlarged. No dilated small bowel. Appendix not well seen but no right lower quadrant inflammation is identified. Colon diverticula without acute inflammation. Moderate to large amount of stool in the colon with mild to moderate formed stools in the rectum. Vascular/Lymphatic: Aortic atherosclerosis. No enlarged abdominal or pelvic lymph nodes. Reproductive: Status post hysterectomy. No adnexal masses. Other: No free air or free fluid Musculoskeletal: Status post right femoral rodding. Old fracture deformities of the right inferior and superior pubic rami knee. Heterogenous lucency in the sacrum unchanged. No acute osseous abnormality. Multiple old left-sided rib fractures. IMPRESSION: 1. No CT evidence for acute intra-abdominal or pelvic pathology. Moderate to large volume of stool in the colon 2. Old fracture deformities of the right pubic rami. Multiple old left-sided rib fractures. 3. Diverticular disease of the colon but without acute inflammation 4. Subcentimeter hypodense lesions in the kidneys, too small to further characterize. Electronically Signed   By: KDonavan FoilM.D.   On: 10/28/2016 19:22   Dg Chest Port 1 View  Result Date: 10/30/2016 CLINICAL DATA:  Hypoxia. EXAM: PORTABLE CHEST 1 VIEW COMPARISON:  10/28/2016 FINDINGS: There is no opacity at both  lung bases obscuring hemidiaphragms consistent with moderate bilateral effusions. Associated basilar opacity in the lungs is likely atelectasis. Pneumonia is not excluded. No evidence of pulmonary edema. Heart is mildly enlarged.  No mediastinal or hilar masses. No pneumothorax. IMPRESSION: 1. New bilateral moderate size pleural effusions with associated lung base opacity, the latter likely atelectasis. No evidence of pulmonary edema. Electronically Signed   By: DLajean ManesM.D.   On: 10/30/2016 08:41   Dg Abd Portable 1v  Result Date: 10/30/2016 CLINICAL DATA:  Abdominal tenderness.  History of a cholecystectomy. EXAM: PORTABLE ABDOMEN - 1 VIEW COMPARISON:  CT, 10/28/2016 FINDINGS: There is no bowel dilation to suggest obstruction. Mild increased stool is noted in the colon. There has been a prior cholecystectomy. Dense aortic and iliac vascular calcifications are noted. No evidence of a renal or ureteral stone. Soft tissues are otherwise unremarkable. There changes from the ORIF of a proximal right femur fracture. No acute skeletal abnormality. IMPRESSION: 1. No acute findings.  No evidence of bowel obstruction. Electronically Signed   By: DLajean ManesM.D.   On: 10/30/2016 08:43    Cardiac Studies   LV EF: 65% -   70%  ------------------------------------------------------------------- Indications:      Atrial fibrillation - 427.31.  ------------------------------------------------------------------- History:   PMH:  Sepsis.  Atrial fibrillation.  ------------------------------------------------------------------- Study Conclusions  - Left ventricle: The cavity  size was normal. Wall thickness was   increased in a pattern of moderate LVH. Systolic function was   vigorous. The estimated ejection fraction was in the range of 65%   to 70%. Wall motion was normal; there were no regional wall   motion abnormalities. The study is not technically sufficient to   allow evaluation of LV  diastolic function. - Aortic valve: Mildly to moderately calcified annulus. Trileaflet. - Mitral valve: Calcified annulus. There was mild to moderate   regurgitation. - Right ventricle: The cavity size was mildly dilated. - Right atrium: The atrium was moderately dilated. - Tricuspid valve: There was moderate regurgitation. - Pulmonary arteries: PA peak pressure: 31 mm Hg (S). - Pericardium, extracardiac: There was no pericardial effusion.   There was a right pleural effusion. There was a left pleural   effusion.  Impressions:  - Moderate LVH with LVEF 65-70%. Indeterminate diastolic function.   Mildly calcified mitral annulus with mild to moderate mitral   regurgitation. Calcified aortic annulus. Mildly dilated right   ventricle. Moderately dilated right atrium. Moderate tricuspid   regurgitation with PASP estimated 31 mmHg. Evidence of bilateral   pleural effusions.  Impression   Principal Problem:   Acute metabolic encephalopathy Active Problems:   Severe sepsis ([place])   Acute cystitis with hematuria   Atrial fibrillation with RVR (HCC)   Rhabdomyolysis   Acute diastolic (congestive) heart failure (HCC)   Recommendation   1. Mrs. Appenzeller presents with confusion and is found to likely have urinary tract infection with severe sepsis. She also appears to be volume overloaded with bilateral pleural effusions and likely acute diastolic congestive heart failure. Echo today fortunate shows preserved LV systolic function with moderate LVH and suspected diastolic dysfunction. This is in the setting of A. fib with RVR. It's unclear whether A. fib is new onset or not from records however she is being anticoagulated on heparin. She appears to be clear today on my exam that she was on admission and hopefully will return to a normal baseline mental status. That would help determine whether she is a candidate for long-term anticoagulation or not. At this point I would continue heparin and we  could consider transitioning to a novel oral anticoagulant prior to discharge. She does need additional rate control. She is currently on amiodarone. Blood pressure appears to support the addition of by mouth beta blocker which I will start. She did receive almost 4 L for sepsis and likely has some volume overload related to this = overall she 6 L positive. I would advise starting Lasix at this point - give 40 mg IV x 1 now. I agree that her troponin elevation is likely related to congestive heart failure and or demand ischemia. At this point I would not pursue an ischemic workup.  Thanks for the consultation. Cardiology will follow with you.  Time Spent Directly with Patient:  I have spent a total of 45 minutes with the patient reviewing hospital notes, telemetry, EKGs, labs and examining the patient as well as establishing an assessment and plan that was discussed personally with the patient. > 50% of time was spent in direct patient care.  Length of Stay:  LOS: 2 days   Pixie Casino, MD, [place]  Attending Cardiologist  Direct Dial: [phone]  Fax: [phone]  Website: [email or web address] Jaesean Litzau 10/30/2016, 2:49 PM

## 2016-10-30 NOTE — Progress Notes (Signed)
10/30/2016 Patient receive Soap Sud Enema at 1620 this evening. Patient was check to see if she had a bowel movement at 1845, did not have any result. Fred Banker) was made aware. [facility] RN.

## 2016-10-30 NOTE — Progress Notes (Signed)
Pharmacy Antibiotic Note  Haley Tucker is a 83 y.o. female admitted on 10/28/2016 with sepsis.  Pharmacy has been consulted for vancomycin and Zosyn dosing.  The patient's renal function has improved - SCr down to 0.62, CrCl~40-50 ml/min. Will adjust the Vancomycin dose today.  Plan: 1. Adjust Vancomycin to 500 mg IV every 12 hours 2. Continue Zosyn 3.375g IV every 8 hours 3. Will continue to follow renal function, culture results, LOT, and antibiotic de-escalation plans   Height: 5\' 2"  (157.5 cm) Weight: 144 lb 12.8 oz (65.7 kg) IBW/kg (Calculated) : 50.1  Temp (24hrs), Avg:99 F (37.2 C), Min:98 F (36.7 C), Max:100.5 F (38.1 C)   Recent Labs Lab 10/28/16 1655 10/28/16 1710 10/28/16 1940 10/28/16 2320 10/29/16 0302 10/29/16 0705 10/29/16 0740 10/29/16 1226 10/29/16 1355 10/30/16 0441 10/30/16 0926  WBC 17.2*  --   --   --   --  11.0*  --  10.6*  --  10.1 8.9  CREATININE 1.23*  --   --   --   --  0.88 0.87  --  0.80  --  0.62  LATICACIDVEN  --  3.93* 3.80* 3.3* 1.7  --   --   --   --   --   --     Estimated Creatinine Clearance: 49.8 mL/min (by C-G formula based on SCr of 0.62 mg/dL).    No Known Allergies  Antimicrobials this admission: Vanc 6/28>> Zosyn 6/28>>  Dose adjustments this admission: n/a  Microbiology results: 6/28 BCx >> ngtd 6/28 MRSA PCR >> neg 6/28 UCx >>  Thank you for allowing pharmacy to be a part of this patient's care.  7/28, PharmD, BCPS Clinical Pharmacist Clinical phone for 10/30/2016 from 7a-3:30p: [phone] If after 3:30p, please call main pharmacy at: [phone] 10/30/2016 12:24 PM

## 2016-10-30 NOTE — Progress Notes (Signed)
10/30/2016 Patient alert, she have some confusion. Rn unable to complete admission history and family is not present at bedside. [facility] RN.

## 2016-10-30 NOTE — Progress Notes (Signed)
ANTICOAGULATION CONSULT NOTE - Follow Up Consult  Pharmacy Consult for heparin Indication: atrial fibrillation  Labs:  Recent Labs  10/28/16 1655  10/28/16 2320 10/29/16 0705 10/29/16 0740 10/29/16 1226 10/29/16 1355 10/29/16 1741 10/30/16 0441  HGB 14.6  --   --  12.8  --  14.0  --   --  12.1  HCT 43.8  --   --  39.5  --  41.7  --   --  36.4  PLT 218  --   --  204  --  196  --   --  203  APTT  --   --  30  --   --   --   --   --   --   LABPROT 20.5*  --   --   --   --   --   --   --   --   INR 1.73  --   --   --   --   --   --   --   --   HEPARINUNFRC  --   < > <0.10* <0.10*  --   --   --  <0.10* 0.24*  CREATININE 1.23*  --   --  0.88 0.87  --  0.80  --   --   CKTOTAL 743*  --   --   --   --   --   --   --   --   TROPONINI  --   --  0.12* 0.07*  0.13*  --  0.14*  --   --   --   < > = values in this interval not displayed.   Assessment: 83yo female remains subtherapeutic on heparin after rate increase though getting closer to goal.  Goal of Therapy:  Heparin level 0.3-0.7 units/ml   Plan:  Will increase heparin gtt by ~2 units/kg/hr to 1250 units/hr and check level in 8hr.  Vernard Gambles, PharmD, BCPS  10/30/2016,5:28 AM

## 2016-10-30 NOTE — Progress Notes (Addendum)
PROGRESS NOTE    KATLYNE NISHIDA   JXB:[id number]  DOB: [birth year]  DOA: 10/28/2016 PCP: No primary care provider on file.   Brief Narrative:  83 y/o female with a h/o Rheumatoid arthritis, frequent UTIs,   HTN who lives alone was brought in by EMS for confusion. She was found laying in feces and urine. Her family had not heard from her in 2 days and called the town hall who called first responders.  In the ER she was noted to be alert but disoriented. BP 90/60, HR in 110s, Temp 101.3 with a UA consistent with UTI, warm and erythematous right knee and tender abdomen.  -CT abd/pelvis was normal except for a large stool burden.  - She has had a right knee replacement and xrays reveal hardware to be stable and no abnormalities.  Lactic acid 3.93 Procalcitonin 5.34 CK total 743 WBV 17.2 with left shift BUN 376, Cr 1.23, sodium 134 Has mildly elevated troponins CT head normal - noted to have A-fib with RVR on admission- given aggressive fluid boluses and converted to NSR Noted to be hypoxic but had normal CXR Admitted for sepsis thought to be due to a UTI.   Subjective: More easily awakened this AM aware she is in the hospital - does not complain of chest, abdominal or knee pain. No dyspnea. No cough. No other complaints.   Assessment & Plan:   Active Problems:   Severe sepsis (HCC) - vitals and labs as mentioned above consistent with severe sepsis -  lactic acid has normalized -  will continue Vanc and Zosyn - blood cultures continue to be NGTD -U culture pending - right  knee erythema appears to be improving- it is less tender on exam today- still no swelling-  may be injured from a fall from possibly laying on it for a prolonged period-  - abdomen is tender again today but less so- no signs of diverticulitis or colitis on CT   A-fib with RVR  - converted to NSR In ER after fluid boluses but went back in to A-fib with RVR yesterday evening. Was started on a Cardizem infusion  which was titrate as high as 17 mg/hr (per night nurse) and failed to control HR. IV Metoprolol was ineffective as well-  Subsequently transitioned to an Amiodarone infusion. HR still elevated in low 100s this AM - awaiting ECHO -  thyroid functions WNL - CHA2DS2-VASc Score at least 4 - on heparin infusion which was started in the ER- will continue -will need to assess fall risk before sending home on NOAC - noted to be on Metoprolol at home- on hold at this time due to sepsis - IV Metoprolol was apparently ineffective last night  Acute encephalopathy - CT head in ER unrevealing but at risk for CVA due to A-fib - is aware she is in the hospital  - likely due to UTI/ Sepsis- follow for improvement   Hypoxia - ? Due to sepsis - clear lungs on exam- no respiratory distress- CXR clear on admission - today CXR shows b/l effusions with no pulmonary edema - will stop IVF today  Abdominal tenderness and constipation - disimpaction done yesterday - apparently she had a large BM- but xray still showing excess stool burden- will order enema  AKI - resolved with IVF- cont to follow  Hyponatremia - - likely is due to HCTZ which is on hold  Elevated Troponin - mild elevation noted- f/u on ECHO- suspect cardiac strain due to sepsis  and A-fib  Rhabdomyolysis - resolving   Hypophosphatemia - replaced  Elevated T bili - follow- no abnormality noted on CT abd/pelvis- possibly due to sepsis   DVT prophylaxis: Heparin infusion Code Status: DNR Family Communication: Adair Patter Disposition Plan: to be determined Consultants:   none Procedures:    Antimicrobials:  Anti-infectives    Start     Dose/Rate Route Frequency Ordered Stop   10/30/16 2000  piperacillin-tazobactam (ZOSYN) IVPB 3.375 g     3.375 g 12.5 mL/hr over 240 Minutes Intravenous Every 8 hours 10/29/16 1706     10/29/16 1700  vancomycin (VANCOCIN) 500 mg in sodium chloride 0.9 % 100 mL IVPB     500 mg 100 mL/hr  over 60 Minutes Intravenous Every 24 hours 10/28/16 1810     10/28/16 2200  piperacillin-tazobactam (ZOSYN) IVPB 3.375 g  Status:  Discontinued     3.375 g 12.5 mL/hr over 240 Minutes Intravenous Every 8 hours 10/28/16 1810 10/29/16 1706   10/28/16 1700  piperacillin-tazobactam (ZOSYN) IVPB 3.375 g     3.375 g 100 mL/hr over 30 Minutes Intravenous  Once 10/28/16 1650 10/28/16 1750   10/28/16 1700  vancomycin (VANCOCIN) IVPB 1000 mg/200 mL premix     1,000 mg 200 mL/hr over 60 Minutes Intravenous  Once 10/28/16 1650 10/28/16 1853       Objective: Vitals:   10/30/16 0436 10/30/16 0500 10/30/16 0830 10/30/16 1030  BP: 129/62 128/68 (!) 116/103 (!) 139/50  Pulse: (!) 112 (!) 53    Resp: (!) 27 (!) 29    Temp: 98.6 F (37 C)  98.1 F (36.7 C)   TempSrc: Oral  Oral   SpO2: 97% 97%    Weight:      Height:        Intake/Output Summary (Last 24 hours) at 10/30/16 1119 Last data filed at 10/30/16 0651  Gross per 24 hour  Intake          1774.27 ml  Output              475 ml  Net          1299.27 ml   Filed Weights   10/28/16 1646 10/29/16 0015 10/30/16 0400  Weight: 54.4 kg (120 lb) 62.6 kg (138 lb) 65.7 kg (144 lb 12.8 oz)    Examination: General exam: Appears comfortable - sleepy  HEENT: PERRLA, oral mucosa dry, no sclera icterus or thrush Respiratory system: Clear to auscultation. Respiratory effort normal. Cardiovascular system: S1 & S2 heard, RRR.  No murmurs  Gastrointestinal system: Abdomen soft,  Tender in right abdomen today, nondistended. Normal bowel sound. No organomegaly Central nervous system:   No focal neurological deficits.- oriented to person and place today Extremities: No cyanosis, clubbing or edema Skin: mild erythema of right knee, improved from yesterday- less tenderness Psychiatry:  Mood & affect appropriate.     Data Reviewed: I have personally reviewed following labs and imaging studies  CBC:  Recent Labs Lab 10/28/16 1655 10/29/16 0705  10/29/16 1226 10/30/16 0441 10/30/16 0926  WBC 17.2* 11.0* 10.6* 10.1 8.9  NEUTROABS 14.3*  --   --   --   --   HGB 14.6 12.8 14.0 12.1 12.4  HCT 43.8 39.5 41.7 36.4 37.3  MCV 90.9 90.8 90.3 89.0 89.4  PLT 218 204 196 203 194   Basic Metabolic Panel:  Recent Labs Lab 10/28/16 1655 10/28/16 2048 10/28/16 2320 10/29/16 0705 10/29/16 0740 10/29/16 1355 10/30/16 0441 10/30/16 1610  NA 134*  --   --  134* 133* 135  --  134*  K 4.0  --   --  3.6 3.7 3.7  --  3.0*  CL 103  --   --  105 105 108  --  108  CO2 18*  --   --  22 21* 17*  --  17*  GLUCOSE 84  --   --  123* 119* 89  --  113*  BUN 36*  --   --  24* 24* 24*  --  20  CREATININE 1.23*  --   --  0.88 0.87 0.80  --  0.62  CALCIUM 7.9*  --   --  7.2* 7.2* 7.6*  --  6.9*  MG  --  1.7  --  2.0  --   --   --   --   PHOS  --   --  2.1* 2.0*  --   --  1.5*  --    GFR: Estimated Creatinine Clearance: 49.8 mL/min (by C-G formula based on SCr of 0.62 mg/dL). Liver Function Tests:  Recent Labs Lab 10/28/16 1655 10/29/16 0705 10/29/16 1355  AST 55* 33 35  ALT 38 27 26  ALKPHOS 81 64 75  BILITOT 1.8* 1.6* 1.5*  PROT 6.2* 5.3* 5.9*  ALBUMIN 2.8* 2.3* 2.5*    Recent Labs Lab 10/28/16 1655  LIPASE 27   No results for input(s): AMMONIA in the last 168 hours. Coagulation Profile:  Recent Labs Lab 10/28/16 1655  INR 1.73   Cardiac Enzymes:  Recent Labs Lab 10/28/16 1655 10/28/16 2320 10/29/16 0705 10/29/16 1226 10/30/16 0441  CKTOTAL 743*  --   --   --  264*  TROPONINI  --  0.12* 0.07*  0.13* 0.14* 0.13*   BNP (last 3 results) No results for input(s): PROBNP in the last 8760 hours. HbA1C: No results for input(s): HGBA1C in the last 72 hours. CBG: No results for input(s): GLUCAP in the last 168 hours. Lipid Profile: No results for input(s): CHOL, HDL, LDLCALC, TRIG, CHOLHDL, LDLDIRECT in the last 72 hours. Thyroid Function Tests:  Recent Labs  10/29/16 1355  TSH 1.306  FREET4 0.94   Anemia  Panel: No results for input(s): VITAMINB12, FOLATE, FERRITIN, TIBC, IRON, RETICCTPCT in the last 72 hours. Urine analysis:    Component Value Date/Time   COLORURINE AMBER (A) 10/28/2016 1804   APPEARANCEUR CLOUDY (A) 10/28/2016 1804   LABSPEC 1.017 10/28/2016 1804   PHURINE 5.0 10/28/2016 1804   GLUCOSEU NEGATIVE 10/28/2016 1804   HGBUR MODERATE (A) 10/28/2016 1804   BILIRUBINUR NEGATIVE 10/28/2016 1804   KETONESUR 80 (A) 10/28/2016 1804   PROTEINUR 30 (A) 10/28/2016 1804   NITRITE POSITIVE (A) 10/28/2016 1804   LEUKOCYTESUR LARGE (A) 10/28/2016 1804   Sepsis Labs: @LABRCNTIP (procalcitonin:4,lacticidven:4) ) Recent Results (from the past 240 hour(s))  Culture, blood (Routine x 2)     Status: None (Preliminary result)   Collection Time: 10/28/16  4:55 PM  Result Value Ref Range Status   Specimen Description BLOOD LEFT ANTECUBITAL  Final   Special Requests   Final    BOTTLES DRAWN AEROBIC AND ANAEROBIC Blood Culture adequate volume   Culture NO GROWTH < 24 HOURS  Final   Report Status PENDING  Incomplete  Culture, blood (Routine x 2)     Status: None (Preliminary result)   Collection Time: 10/28/16  5:00 PM  Result Value Ref Range Status   Specimen Description BLOOD RIGHT ANTECUBITAL  Final  Special Requests   Final    BOTTLES DRAWN AEROBIC AND ANAEROBIC Blood Culture adequate volume   Culture NO GROWTH < 24 HOURS  Final   Report Status PENDING  Incomplete  MRSA PCR Screening     Status: Abnormal   Collection Time: 10/28/16 10:57 PM  Result Value Ref Range Status   MRSA by PCR INVALID RESULTS, SPECIMEN SENT FOR CULTURE (A) NEGATIVE Final    Comment: J.WOODY,RN AT 0330 BY L.PITT 10/29/16        The GeneXpert MRSA Assay (FDA approved for NASAL specimens only), is one component of a comprehensive MRSA colonization surveillance program. It is not intended to diagnose MRSA infection nor to guide or monitor treatment for MRSA infections.   MRSA culture     Status: None    Collection Time: 10/28/16 10:57 PM  Result Value Ref Range Status   Specimen Description NASAL SWAB  Final   Special Requests NONE  Final   Culture NO MRSA DETECTED  Final   Report Status 10/30/2016 FINAL  Final         Radiology Studies: Dg Chest 2 View  Result Date: 10/28/2016 CLINICAL DATA:  Possible sepsis. EXAM: CHEST  2 VIEW COMPARISON:  None. FINDINGS: The lungs are clear without focal pneumonia, edema, pneumothorax or pleural effusion. Cardiopericardial silhouette is at upper limits of normal for size. The visualized bony structures of the thorax are intact. Telemetry leads overlie the chest. IMPRESSION: No active cardiopulmonary disease. Electronically Signed   By: Kennith Center M.D.   On: 10/28/2016 17:42   Dg Knee 2 Views Right  Result Date: 10/28/2016 CLINICAL DATA:  83 year old female with possible sepsis. Right knee swelling and warmth. EXAM: RIGHT KNEE - 1-2 VIEW COMPARISON:  [facility] right femur series 12/30/15 and earlier FINDINGS: Partially visible intramedullary rod in the distal right femur with distal interlocking cortical screw. This hardware appears stable since 2017. No loosening. Superimposed right total knee arthroplasty. The total-knee hardware also appears stable and intact. No definite right knee joint effusion. Osteopenia. No cortical osteolysis identified about the right knee. No acute fracture. No subcutaneous gas. IMPRESSION: Stable radiographic appearance of the distal right femur and right total knee orthopedic hardware since 2017. No acute osseous abnormality identified. Electronically Signed   By: Odessa Fleming M.D.   On: 10/28/2016 17:57   Ct Head Wo Contrast  Result Date: 10/28/2016 CLINICAL DATA:  83 year old female with altered mental status. EXAM: CT HEAD WITHOUT CONTRAST TECHNIQUE: Contiguous axial images were obtained from the base of the skull through the vertex without intravenous contrast. COMPARISON:  01/22/2016 and earlier. FINDINGS: Brain:  Stable cerebral volume. No midline shift, ventriculomegaly, mass effect, evidence of mass lesion, intracranial hemorrhage or evidence of cortically based acute infarction. Patchy bilateral white matter hypodensity is stable since 2017. No cortical encephalomalacia. Vascular: Calcified atherosclerosis at the skull base. No suspicious intracranial vascular hyperdensity. Skull: Stable.  No acute osseous abnormality identified. Sinuses/Orbits: Stable.  Chronic right maxillary opacification. Other: Stable orbit and scalp soft tissues. IMPRESSION: No acute intracranial abnormality. Stable non contrast CT appearance of the brain since 2017. Electronically Signed   By: Odessa Fleming M.D.   On: 10/28/2016 18:00   Ct Abdomen Pelvis W Contrast  Result Date: 10/28/2016 CLINICAL DATA:  Diffuse abdominal pain with fever EXAM: CT ABDOMEN AND PELVIS WITH CONTRAST TECHNIQUE: Multidetector CT imaging of the abdomen and pelvis was performed using the standard protocol following bolus administration of intravenous contrast. CONTRAST:  ISOVUE-300 IOPAMIDOL (ISOVUE-300)  INJECTION 61% COMPARISON:  05/31/2016 FINDINGS: Lower chest: Lung bases demonstrate patchy dependent atelectasis. No pleural effusion or consolidation. Coronary artery calcification. Trace pericardial effusion or thickening. Hepatobiliary: No focal liver abnormality is seen. Status post cholecystectomy. No biliary dilatation. Pancreas: Unremarkable. No pancreatic ductal dilatation or surrounding inflammatory changes. Spleen: Normal in size without focal abnormality. Adrenals/Urinary Tract: Adrenal glands are within normal limits. Subcentimeter hypodense lesions within the kidneys, too small to further characterize. Bladder unremarkable. Stomach/Bowel: Stomach is nonenlarged. No dilated small bowel. Appendix not well seen but no right lower quadrant inflammation is identified. Colon diverticula without acute inflammation. Moderate to large amount of stool in the colon  with mild to moderate formed stools in the rectum. Vascular/Lymphatic: Aortic atherosclerosis. No enlarged abdominal or pelvic lymph nodes. Reproductive: Status post hysterectomy. No adnexal masses. Other: No free air or free fluid Musculoskeletal: Status post right femoral rodding. Old fracture deformities of the right inferior and superior pubic rami knee. Heterogenous lucency in the sacrum unchanged. No acute osseous abnormality. Multiple old left-sided rib fractures. IMPRESSION: 1. No CT evidence for acute intra-abdominal or pelvic pathology. Moderate to large volume of stool in the colon 2. Old fracture deformities of the right pubic rami. Multiple old left-sided rib fractures. 3. Diverticular disease of the colon but without acute inflammation 4. Subcentimeter hypodense lesions in the kidneys, too small to further characterize. Electronically Signed   By: Jasmine Pang M.D.   On: 10/28/2016 19:22   Dg Chest Port 1 View  Result Date: 10/30/2016 CLINICAL DATA:  Hypoxia. EXAM: PORTABLE CHEST 1 VIEW COMPARISON:  10/28/2016 FINDINGS: There is no opacity at both lung bases obscuring hemidiaphragms consistent with moderate bilateral effusions. Associated basilar opacity in the lungs is likely atelectasis. Pneumonia is not excluded. No evidence of pulmonary edema. Heart is mildly enlarged.  No mediastinal or hilar masses. No pneumothorax. IMPRESSION: 1. New bilateral moderate size pleural effusions with associated lung base opacity, the latter likely atelectasis. No evidence of pulmonary edema. Electronically Signed   By: Amie Portland M.D.   On: 10/30/2016 08:41   Dg Abd Portable 1v  Result Date: 10/30/2016 CLINICAL DATA:  Abdominal tenderness.  History of a cholecystectomy. EXAM: PORTABLE ABDOMEN - 1 VIEW COMPARISON:  CT, 10/28/2016 FINDINGS: There is no bowel dilation to suggest obstruction. Mild increased stool is noted in the colon. There has been a prior cholecystectomy. Dense aortic and iliac vascular  calcifications are noted. No evidence of a renal or ureteral stone. Soft tissues are otherwise unremarkable. There changes from the ORIF of a proximal right femur fracture. No acute skeletal abnormality. IMPRESSION: 1. No acute findings.  No evidence of bowel obstruction. Electronically Signed   By: Amie Portland M.D.   On: 10/30/2016 08:43      Scheduled Meds: . mouth rinse  15 mL Mouth Rinse BID  . pantoprazole (PROTONIX) IV  40 mg Intravenous QHS   Continuous Infusions: . sodium chloride 250 mL (10/28/16 2338)  . sodium chloride 125 mL/hr at 10/30/16 0640  . amiodarone 60 mg/hr (10/30/16 0951)   Followed by  . amiodarone    . heparin 1,250 Units/hr (10/30/16 0555)  . magnesium sulfate 1 - 4 g bolus IVPB 2 g (10/30/16 1024)  . piperacillin-tazobactam (ZOSYN)  IV    . potassium chloride    . vancomycin Stopped (10/29/16 1933)     LOS: 2 days    Time spent in minutes: 35    Calvert Cantor, MD [facility] Pager: [email or web address] Password [facility] 10/30/2016,  11:19 AM

## 2016-10-31 DIAGNOSIS — J9621 Acute and chronic respiratory failure with hypoxia: Secondary | ICD-10-CM

## 2016-10-31 LAB — BASIC METABOLIC PANEL
ANION GAP: 9 (ref 5–15)
BUN: 14 mg/dL (ref 6–20)
CHLORIDE: 101 mmol/L (ref 101–111)
CO2: 21 mmol/L — AB (ref 22–32)
CREATININE: 0.64 mg/dL (ref 0.44–1.00)
Calcium: 6.6 mg/dL — ABNORMAL LOW (ref 8.9–10.3)
GFR calc non Af Amer: >60 mL/min (ref >60)
Glucose, Bld: 123 mg/dL — ABNORMAL HIGH (ref 65–99)
Potassium: 3.5 mmol/L (ref 3.5–5.1)
Sodium: 131 mmol/L — ABNORMAL LOW (ref 135–145)

## 2016-10-31 LAB — COMPREHENSIVE METABOLIC PANEL
ALBUMIN: 2.2 g/dL — AB (ref 3.5–5.0)
ALT: 19 U/L (ref 14–54)
AST: 23 U/L (ref 15–41)
Alkaline Phosphatase: 72 U/L (ref 38–126)
Anion gap: 9 (ref 5–15)
BILIRUBIN TOTAL: 1.2 mg/dL (ref 0.3–1.2)
BUN: 12 mg/dL (ref 6–20)
CO2: 21 mmol/L — ABNORMAL LOW (ref 22–32)
Calcium: 6.8 mg/dL — ABNORMAL LOW (ref 8.9–10.3)
Chloride: 100 mmol/L — ABNORMAL LOW (ref 101–111)
Creatinine, Ser: 0.62 mg/dL (ref 0.44–1.00)
GFR calc Af Amer: >60 mL/min (ref >60)
GFR calc non Af Amer: >60 mL/min (ref >60)
GLUCOSE: 99 mg/dL (ref 65–99)
POTASSIUM: 2.4 mmol/L — AB (ref 3.5–5.1)
Sodium: 130 mmol/L — ABNORMAL LOW (ref 135–145)
TOTAL PROTEIN: 5.6 g/dL — AB (ref 6.5–8.1)

## 2016-10-31 LAB — CBC
HEMATOCRIT: 32.4 % — AB (ref 36.0–46.0)
HEMOGLOBIN: 11.3 g/dL — AB (ref 12.0–15.0)
MCH: 30.2 pg (ref 26.0–34.0)
MCHC: 34.9 g/dL (ref 30.0–36.0)
MCV: 86.6 fL (ref 78.0–100.0)
Platelets: 209 10*3/uL (ref 150–400)
RBC: 3.74 MIL/uL — AB (ref 3.87–5.11)
RDW: 17.4 % — ABNORMAL HIGH (ref 11.5–15.5)
WBC: 7.9 10*3/uL (ref 4.0–10.5)

## 2016-10-31 LAB — MRSA PCR SCREENING: MRSA BY PCR: NEGATIVE

## 2016-10-31 LAB — MAGNESIUM: Magnesium: 1.8 mg/dL (ref 1.7–2.4)

## 2016-10-31 LAB — HEPARIN LEVEL (UNFRACTIONATED)
Heparin Unfractionated: 0.35 IU/mL (ref 0.30–0.70)
Heparin Unfractionated: 0.27 IU/mL — ABNORMAL LOW (ref 0.30–0.70)

## 2016-10-31 LAB — PHOSPHORUS: Phosphorus: 1.2 mg/dL — ABNORMAL LOW (ref 2.5–4.6)

## 2016-10-31 MED ORDER — K PHOS MONO-SOD PHOS DI & MONO 155-852-130 MG PO TABS
500.0000 mg | ORAL_TABLET | Freq: Once | ORAL | Status: AC
  Administered 2016-10-31: 500 mg via ORAL
  Filled 2016-10-31: qty 2

## 2016-10-31 MED ORDER — AMIODARONE HCL 200 MG PO TABS
200.0000 mg | ORAL_TABLET | Freq: Every day | ORAL | Status: DC
  Administered 2016-10-31 – 2016-11-02 (×3): 200 mg via ORAL
  Filled 2016-10-31 (×3): qty 1

## 2016-10-31 MED ORDER — POTASSIUM CHLORIDE 10 MEQ/100ML IV SOLN
10.0000 meq | INTRAVENOUS | Status: AC
  Administered 2016-10-31 (×4): 10 meq via INTRAVENOUS
  Filled 2016-10-31 (×4): qty 100

## 2016-10-31 MED ORDER — METOPROLOL TARTRATE 25 MG PO TABS
25.0000 mg | ORAL_TABLET | Freq: Two times a day (BID) | ORAL | Status: DC
  Administered 2016-10-31 – 2016-11-02 (×5): 25 mg via ORAL
  Filled 2016-10-31 (×5): qty 1

## 2016-10-31 MED ORDER — MAGNESIUM CITRATE PO SOLN
1.0000 | Freq: Once | ORAL | Status: AC
  Administered 2016-10-31: 1 via ORAL
  Filled 2016-10-31: qty 296

## 2016-10-31 MED ORDER — SODIUM PHOSPHATES 45 MMOLE/15ML IV SOLN
30.0000 mmol | Freq: Once | INTRAVENOUS | Status: AC
  Administered 2016-10-31: 30 mmol via INTRAVENOUS
  Filled 2016-10-31: qty 10

## 2016-10-31 MED ORDER — POTASSIUM CHLORIDE CRYS ER 20 MEQ PO TBCR
40.0000 meq | EXTENDED_RELEASE_TABLET | Freq: Once | ORAL | Status: AC
  Administered 2016-10-31: 40 meq via ORAL
  Filled 2016-10-31: qty 2

## 2016-10-31 MED ORDER — APIXABAN 5 MG PO TABS
5.0000 mg | ORAL_TABLET | Freq: Two times a day (BID) | ORAL | Status: DC
  Administered 2016-10-31 – 2016-11-04 (×9): 5 mg via ORAL
  Filled 2016-10-31 (×9): qty 1

## 2016-10-31 NOTE — Progress Notes (Signed)
CRITICAL VALUE ALERT  Critical Value: potassium 2.4  Date & Time Notied:  0930  Provider Notified: Rizwan at [phone]  Orders Received/Actions taken: pending

## 2016-10-31 NOTE — Progress Notes (Signed)
DAILY PROGRESS NOTE   Patient Name: Haley Tucker Date of Encounter: 10/31/2016  Hospital Problem List   Principal Problem:   Acute metabolic encephalopathy Active Problems:   Severe sepsis ([place])   Acute cystitis with hematuria   Atrial fibrillation with RVR (HCC)   Rhabdomyolysis   Acute diastolic (congestive) heart failure [facility])    Chief Complaint   Feeling better  Subjective   Appears to have converted to NSR overnight- now in the 70's. Diuresed 2.8L negative. Creatinine stable. Hypokalemic today at 2.4. Magnesium 1.8 - phophorus low at 1.2. Repleting per medicine service. Echo yesterday shows EF 65-70% - mild to moderate MR, moderate TR.  Objective   Vitals:   10/31/16 0500 10/31/16 0600 10/31/16 0800 10/31/16 1007  BP: (!) 143/66 (!) 144/75  138/66  Pulse: 81 84  93  Resp: (!) 24 (!) 25    Temp: 98.5 F (36.9 C)  98 F (36.7 C)   TempSrc: Oral  Oral   SpO2: 98% 98%    Weight: 138 lb (62.6 kg)     Height:        Intake/Output Summary (Last 24 hours) at 10/31/16 1137 Last data filed at 10/31/16 0600  Gross per 24 hour  Intake            858.2 ml  Output             3750 ml  Net          -2891.8 ml   Filed Weights   10/29/16 0015 10/30/16 0400 10/31/16 0500  Weight: 138 lb (62.6 kg) 144 lb 12.8 oz (65.7 kg) 138 lb (62.6 kg)    Physical Exam   General appearance: alert and no distress Neck: no carotid bruit and no JVD Lungs: clear to auscultation bilaterally Heart: regular rate and rhythm Abdomen: soft, non-tender; bowel sounds normal; no masses,  no organomegaly Extremities: edema trace pedal Pulses: 2+ and symmetric Skin: Skin color, texture, turgor normal. No rashes or lesions Neurologic: Grossly normal Psych: Flat affect  Inpatient Medications    Scheduled Meds: . magnesium citrate  1 Bottle Oral Once  . mouth rinse  15 mL Mouth Rinse BID  . metoprolol tartrate  25 mg Oral BID  . pantoprazole (PROTONIX) IV  40 mg Intravenous QHS     Continuous Infusions: . sodium chloride 250 mL (10/28/16 2338)  . amiodarone 30 mg/hr (10/31/16 0630)  . heparin 1,350 Units/hr (10/31/16 0600)  . piperacillin-tazobactam (ZOSYN)  IV 3.375 g (10/31/16 0627)  . potassium chloride 10 mEq (10/31/16 1007)  . sodium phosphate  Dextrose 5% IVPB    . vancomycin Stopped (10/31/16 0302)    PRN Meds: sodium chloride, metoprolol tartrate   Labs   Results for orders placed or performed during the hospital encounter of 10/28/16 (from the past 48 hour(s))  Troponin I     Status: Abnormal   Collection Time: 10/29/16 12:26 PM  Result Value Ref Range   Troponin I 0.14 (HH) <0.03 ng/mL    Comment: CRITICAL VALUE NOTED.  VALUE IS CONSISTENT WITH PREVIOUSLY REPORTED AND CALLED VALUE.  CBC     Status: Abnormal   Collection Time: 10/29/16 12:26 PM  Result Value Ref Range   WBC 10.6 (H) 4.0 - 10.5 K/uL   RBC 4.62 3.87 - 5.11 MIL/uL   Hemoglobin 14.0 12.0 - 15.0 g/dL   HCT 41.7 36.0 - 46.0 %   MCV 90.3 78.0 - 100.0 fL   MCH 30.3 26.0 -  34.0 pg   MCHC 33.6 30.0 - 36.0 g/dL   RDW 18.0 (H) 11.5 - 15.5 %   Platelets 196 150 - 400 K/uL  TSH     Status: None   Collection Time: 10/29/16  1:55 PM  Result Value Ref Range   TSH 1.306 0.350 - 4.500 uIU/mL    Comment: Performed by a 3rd Generation assay with a functional sensitivity of <=0.01 uIU/mL.  T4, free     Status: None   Collection Time: 10/29/16  1:55 PM  Result Value Ref Range   Free T4 0.94 0.61 - 1.12 ng/dL    Comment: (NOTE) Biotin ingestion may interfere with free T4 tests. If the results are inconsistent with the TSH level, previous test results, or the clinical presentation, then consider biotin interference. If needed, order repeat testing after stopping biotin.   Comprehensive metabolic panel     Status: Abnormal   Collection Time: 10/29/16  1:55 PM  Result Value Ref Range   Sodium 135 135 - 145 mmol/L   Potassium 3.7 3.5 - 5.1 mmol/L   Chloride 108 101 - 111 mmol/L   CO2 17  (L) 22 - 32 mmol/L   Glucose, Bld 89 65 - 99 mg/dL   BUN 24 (H) 6 - 20 mg/dL   Creatinine, Ser 0.80 0.44 - 1.00 mg/dL   Calcium 7.6 (L) 8.9 - 10.3 mg/dL   Total Protein 5.9 (L) 6.5 - 8.1 g/dL   Albumin 2.5 (L) 3.5 - 5.0 g/dL   AST 35 15 - 41 U/L   ALT 26 14 - 54 U/L   Alkaline Phosphatase 75 38 - 126 U/L   Total Bilirubin 1.5 (H) 0.3 - 1.2 mg/dL   GFR calc non Af Amer >60 >60 mL/min   GFR calc Af Amer >60 >60 mL/min    Comment: (NOTE) The eGFR has been calculated using the CKD EPI equation. This calculation has not been validated in all clinical situations. eGFR's persistently <60 mL/min signify possible Chronic Kidney Disease.    Anion gap 10 5 - 15  Heparin level (unfractionated)     Status: Abnormal   Collection Time: 10/29/16  5:41 PM  Result Value Ref Range   Heparin Unfractionated <0.10 (L) 0.30 - 0.70 IU/mL    Comment:        IF HEPARIN RESULTS ARE BELOW EXPECTED VALUES, AND PATIENT DOSAGE HAS BEEN CONFIRMED, SUGGEST FOLLOW UP TESTING OF ANTITHROMBIN III LEVELS.   CBC     Status: Abnormal   Collection Time: 10/30/16  4:41 AM  Result Value Ref Range   WBC 10.1 4.0 - 10.5 K/uL   RBC 4.09 3.87 - 5.11 MIL/uL   Hemoglobin 12.1 12.0 - 15.0 g/dL   HCT 36.4 36.0 - 46.0 %   MCV 89.0 78.0 - 100.0 fL   MCH 29.6 26.0 - 34.0 pg   MCHC 33.2 30.0 - 36.0 g/dL   RDW 17.8 (H) 11.5 - 15.5 %   Platelets 203 150 - 400 K/uL  Procalcitonin     Status: None   Collection Time: 10/30/16  4:41 AM  Result Value Ref Range   Procalcitonin 2.77 ng/mL    Comment:        Interpretation: PCT > 2 ng/mL: Systemic infection (sepsis) is likely, unless other causes are known. (NOTE)         ICU PCT Algorithm               Non ICU PCT Algorithm    ----------------------------     ------------------------------  PCT < 0.25 ng/mL                 PCT < 0.1 ng/mL     Stopping of antibiotics            Stopping of antibiotics       strongly encouraged.               strongly encouraged.     ----------------------------     ------------------------------       PCT level decrease by               PCT < 0.25 ng/mL       >= 80% from peak PCT       OR PCT 0.25 - 0.5 ng/mL          Stopping of antibiotics                                             encouraged.     Stopping of antibiotics           encouraged.    ----------------------------     ------------------------------       PCT level decrease by              PCT >= 0.25 ng/mL       < 80% from peak PCT        AND PCT >= 0.5 ng/mL            Continuing antibiotics                                               encouraged.       Continuing antibiotics            encouraged.    ----------------------------     ------------------------------     PCT level increase compared          PCT > 0.5 ng/mL         with peak PCT AND          PCT >= 0.5 ng/mL             Escalation of antibiotics                                          strongly encouraged.      Escalation of antibiotics        strongly encouraged.   CK     Status: Abnormal   Collection Time: 10/30/16  4:41 AM  Result Value Ref Range   Total CK 264 (H) 38 - 234 U/L  Phosphorus     Status: Abnormal   Collection Time: 10/30/16  4:41 AM  Result Value Ref Range   Phosphorus 1.5 (L) 2.5 - 4.6 mg/dL  Heparin level (unfractionated)     Status: Abnormal   Collection Time: 10/30/16  4:41 AM  Result Value Ref Range   Heparin Unfractionated 0.24 (L) 0.30 - 0.70 IU/mL    Comment:        IF HEPARIN RESULTS ARE BELOW EXPECTED VALUES, AND PATIENT DOSAGE HAS BEEN CONFIRMED, SUGGEST FOLLOW UP TESTING OF ANTITHROMBIN III  LEVELS.   Troponin I     Status: Abnormal   Collection Time: 10/30/16  4:41 AM  Result Value Ref Range   Troponin I 0.13 (HH) <0.03 ng/mL    Comment: CRITICAL VALUE NOTED.  VALUE IS CONSISTENT WITH PREVIOUSLY REPORTED AND CALLED VALUE.  Basic metabolic panel     Status: Abnormal   Collection Time: 10/30/16  9:26 AM  Result Value Ref Range   Sodium 134  (L) 135 - 145 mmol/L   Potassium 3.0 (L) 3.5 - 5.1 mmol/L   Chloride 108 101 - 111 mmol/L   CO2 17 (L) 22 - 32 mmol/L   Glucose, Bld 113 (H) 65 - 99 mg/dL   BUN 20 6 - 20 mg/dL   Creatinine, Ser 0.62 0.44 - 1.00 mg/dL   Calcium 6.9 (L) 8.9 - 10.3 mg/dL   GFR calc non Af Amer >60 >60 mL/min   GFR calc Af Amer >60 >60 mL/min    Comment: (NOTE) The eGFR has been calculated using the CKD EPI equation. This calculation has not been validated in all clinical situations. eGFR's persistently <60 mL/min signify possible Chronic Kidney Disease.    Anion gap 9 5 - 15  CBC     Status: Abnormal   Collection Time: 10/30/16  9:26 AM  Result Value Ref Range   WBC 8.9 4.0 - 10.5 K/uL   RBC 4.17 3.87 - 5.11 MIL/uL   Hemoglobin 12.4 12.0 - 15.0 g/dL   HCT 37.3 36.0 - 46.0 %   MCV 89.4 78.0 - 100.0 fL   MCH 29.7 26.0 - 34.0 pg   MCHC 33.2 30.0 - 36.0 g/dL   RDW 17.8 (H) 11.5 - 15.5 %   Platelets 194 150 - 400 K/uL  Comprehensive metabolic panel     Status: Abnormal   Collection Time: 10/30/16 12:08 PM  Result Value Ref Range   Sodium 136 135 - 145 mmol/L   Potassium 3.3 (L) 3.5 - 5.1 mmol/L   Chloride 110 101 - 111 mmol/L   CO2 16 (L) 22 - 32 mmol/L   Glucose, Bld 99 65 - 99 mg/dL   BUN 21 (H) 6 - 20 mg/dL   Creatinine, Ser 0.67 0.44 - 1.00 mg/dL   Calcium 6.8 (L) 8.9 - 10.3 mg/dL   Total Protein 5.3 (L) 6.5 - 8.1 g/dL   Albumin 2.2 (L) 3.5 - 5.0 g/dL   AST 33 15 - 41 U/L   ALT 21 14 - 54 U/L   Alkaline Phosphatase 71 38 - 126 U/L   Total Bilirubin 1.3 (H) 0.3 - 1.2 mg/dL   GFR calc non Af Amer >60 >60 mL/min   GFR calc Af Amer >60 >60 mL/min    Comment: (NOTE) The eGFR has been calculated using the CKD EPI equation. This calculation has not been validated in all clinical situations. eGFR's persistently <60 mL/min signify possible Chronic Kidney Disease.    Anion gap 10 5 - 15  Phosphorus     Status: Abnormal   Collection Time: 10/30/16 12:08 PM  Result Value Ref Range    Phosphorus 1.5 (L) 2.5 - 4.6 mg/dL  Heparin level (unfractionated)     Status: None   Collection Time: 10/30/16  1:39 PM  Result Value Ref Range   Heparin Unfractionated 0.37 0.30 - 0.70 IU/mL    Comment:        IF HEPARIN RESULTS ARE BELOW EXPECTED VALUES, AND PATIENT DOSAGE HAS BEEN CONFIRMED, SUGGEST FOLLOW UP TESTING OF ANTITHROMBIN  III LEVELS.   MRSA PCR Screening     Status: None   Collection Time: 10/30/16  6:43 PM  Result Value Ref Range   MRSA by PCR NEGATIVE NEGATIVE    Comment:        The GeneXpert MRSA Assay (FDA approved for NASAL specimens only), is one component of a comprehensive MRSA colonization surveillance program. It is not intended to diagnose MRSA infection nor to guide or monitor treatment for MRSA infections.   Heparin level (unfractionated)     Status: None   Collection Time: 10/30/16 11:12 PM  Result Value Ref Range   Heparin Unfractionated 0.35 0.30 - 0.70 IU/mL    Comment:        IF HEPARIN RESULTS ARE BELOW EXPECTED VALUES, AND PATIENT DOSAGE HAS BEEN CONFIRMED, SUGGEST FOLLOW UP TESTING OF ANTITHROMBIN III LEVELS.   CBC     Status: Abnormal   Collection Time: 10/31/16  3:03 AM  Result Value Ref Range   WBC 7.9 4.0 - 10.5 K/uL   RBC 3.74 (L) 3.87 - 5.11 MIL/uL   Hemoglobin 11.3 (L) 12.0 - 15.0 g/dL   HCT 32.4 (L) 36.0 - 46.0 %   MCV 86.6 78.0 - 100.0 fL   MCH 30.2 26.0 - 34.0 pg   MCHC 34.9 30.0 - 36.0 g/dL   RDW 17.4 (H) 11.5 - 15.5 %   Platelets 209 150 - 400 K/uL  Heparin level (unfractionated)     Status: Abnormal   Collection Time: 10/31/16  3:03 AM  Result Value Ref Range   Heparin Unfractionated 0.27 (L) 0.30 - 0.70 IU/mL    Comment:        IF HEPARIN RESULTS ARE BELOW EXPECTED VALUES, AND PATIENT DOSAGE HAS BEEN CONFIRMED, SUGGEST FOLLOW UP TESTING OF ANTITHROMBIN III LEVELS.   Comprehensive metabolic panel     Status: Abnormal   Collection Time: 10/31/16  8:17 AM  Result Value Ref Range   Sodium 130 (L) 135 - 145  mmol/L   Potassium 2.4 (LL) 3.5 - 5.1 mmol/L    Comment: CRITICAL RESULT CALLED TO, READ BACK BY AND VERIFIED WITH: J.LOVE RN @ [phone] 10/31/16 BY C.EDENS    Chloride 100 (L) 101 - 111 mmol/L   CO2 21 (L) 22 - 32 mmol/L   Glucose, Bld 99 65 - 99 mg/dL   BUN 12 6 - 20 mg/dL   Creatinine, Ser 0.62 0.44 - 1.00 mg/dL   Calcium 6.8 (L) 8.9 - 10.3 mg/dL   Total Protein 5.6 (L) 6.5 - 8.1 g/dL   Albumin 2.2 (L) 3.5 - 5.0 g/dL   AST 23 15 - 41 U/L   ALT 19 14 - 54 U/L   Alkaline Phosphatase 72 38 - 126 U/L   Total Bilirubin 1.2 0.3 - 1.2 mg/dL   GFR calc non Af Amer >60 >60 mL/min   GFR calc Af Amer >60 >60 mL/min    Comment: (NOTE) The eGFR has been calculated using the CKD EPI equation. This calculation has not been validated in all clinical situations. eGFR's persistently <60 mL/min signify possible Chronic Kidney Disease.    Anion gap 9 5 - 15  Magnesium     Status: None   Collection Time: 10/31/16  8:17 AM  Result Value Ref Range   Magnesium 1.8 1.7 - 2.4 mg/dL  Phosphorus     Status: Abnormal   Collection Time: 10/31/16  8:17 AM  Result Value Ref Range   Phosphorus 1.2 (L) 2.5 - 4.6 mg/dL  ECG   N/A  Telemetry   NSR at 75- Personally Reviewed  Radiology    Dg Chest Port 1 View  Result Date: 10/30/2016 CLINICAL DATA:  Hypoxia. EXAM: PORTABLE CHEST 1 VIEW COMPARISON:  10/28/2016 FINDINGS: There is no opacity at both lung bases obscuring hemidiaphragms consistent with moderate bilateral effusions. Associated basilar opacity in the lungs is likely atelectasis. Pneumonia is not excluded. No evidence of pulmonary edema. Heart is mildly enlarged.  No mediastinal or hilar masses. No pneumothorax. IMPRESSION: 1. New bilateral moderate size pleural effusions with associated lung base opacity, the latter likely atelectasis. No evidence of pulmonary edema. Electronically Signed   By: Lajean Manes M.D.   On: 10/30/2016 08:41   Dg Abd Portable 1v  Result Date: 10/30/2016 CLINICAL  DATA:  Abdominal tenderness.  History of a cholecystectomy. EXAM: PORTABLE ABDOMEN - 1 VIEW COMPARISON:  CT, 10/28/2016 FINDINGS: There is no bowel dilation to suggest obstruction. Mild increased stool is noted in the colon. There has been a prior cholecystectomy. Dense aortic and iliac vascular calcifications are noted. No evidence of a renal or ureteral stone. Soft tissues are otherwise unremarkable. There changes from the ORIF of a proximal right femur fracture. No acute skeletal abnormality. IMPRESSION: 1. No acute findings.  No evidence of bowel obstruction. Electronically Signed   By: Lajean Manes M.D.   On: 10/30/2016 08:43    Cardiac Studies   Echo  Indications:      Atrial fibrillation - 427.31.  ------------------------------------------------------------------- History:   PMH:  Sepsis.  Atrial fibrillation.  ------------------------------------------------------------------- Study Conclusions  - Left ventricle: The cavity size was normal. Wall thickness was   increased in a pattern of moderate LVH. Systolic function was   vigorous. The estimated ejection fraction was in the range of 65%   to 70%. Wall motion was normal; there were no regional wall   motion abnormalities. The study is not technically sufficient to   allow evaluation of LV diastolic function. - Aortic valve: Mildly to moderately calcified annulus. Trileaflet. - Mitral valve: Calcified annulus. There was mild to moderate   regurgitation. - Right ventricle: The cavity size was mildly dilated. - Right atrium: The atrium was moderately dilated. - Tricuspid valve: There was moderate regurgitation. - Pulmonary arteries: PA peak pressure: 31 mm Hg (S). - Pericardium, extracardiac: There was no pericardial effusion.   There was a right pleural effusion. There was a left pleural   effusion.  Impressions:  - Moderate LVH with LVEF 65-70%. Indeterminate diastolic function.   Mildly calcified mitral annulus with  mild to moderate mitral   regurgitation. Calcified aortic annulus. Mildly dilated right   ventricle. Moderately dilated right atrium. Moderate tricuspid   regurgitation with PASP estimated 31 mmHg. Evidence of bilateral   pleural effusions.  Assessment   1. Principal Problem: 2.   Acute metabolic encephalopathy 3. Active Problems: 4.   Severe sepsis ([place]) 5.   Acute cystitis with hematuria 6.   Atrial fibrillation with RVR ([place]) 7.   Rhabdomyolysis 8.   Acute diastolic (congestive) heart failure ([place]) 9.   Plan   1. Converted to sinus - will switch to po amiodarone. Consider transition to Eliquis 5 mg BID from heparin. Repleting electrolytes today-  Still mild volume overload from acute diastolic CHF - EF 00-93% by echo yesterday. Monitor for autodiuresis today. If she is not negative today, may need one more dose of lasix tomorrow to remove volume from sepsis resuscitation. Clinically improving.  Time Spent Directly with  Patient:  I have spent a total of 15 minutes with the patient reviewing hospital notes, telemetry, EKGs, labs and examining the patient as well as establishing an assessment and plan that was discussed personally with the patient. > 50% of time was spent in direct patient care.  Length of Stay:  LOS: 3 days   Pixie Casino, MD, [place]  Attending Cardiologist  Direct Dial: [phone]  Fax: [phone]  Website:  [email or web address] 10/31/2016, 11:37 AM

## 2016-10-31 NOTE — Progress Notes (Signed)
Haley Tucker is a 83 y.o. female patient was a transfer from [facility] awake, alert - oriented  X 4 - no acute distress noted.  VSS - Blood pressure 122/65, pulse 81, temperature 99.1 F (37.3 C), resp. rate 20, height 5\' 2"  (1.575 m), weight 62.6 kg (138 lb), SpO2 96 %.    IV in place, occlusive dsg intact without redness.  Orientation to room, and floor completed with information packet given to patient/family.  Patient declined safety video at this time.  Admission INP armband ID verified with patient/family, and in place.   SR up x 2, fall assessment complete, with patient and family able to verbalize understanding of risk associated with falls, and verbalized understanding to call nsg before up out of bed.  Call light within reach, patient able to voice, and demonstrate understanding.  Skin, clean-dry- intact with evidence of bruising on left hand,     Will cont to eval and treat per MD orders.  , RN 10/31/2016 5:50 PM

## 2016-10-31 NOTE — Progress Notes (Signed)
ANTICOAGULATION CONSULT NOTE - Follow Up Consult  Pharmacy Consult for heparin Indication: atrial fibrillation  Labs:  Recent Labs  10/28/16 1655  10/28/16 2320 10/29/16 0705  10/29/16 1226 10/29/16 1355  10/30/16 0441 10/30/16 0926 10/30/16 1208 10/30/16 1339 10/30/16 2312 10/31/16 0303  HGB 14.6  --   --  12.8  --  14.0  --   --  12.1 12.4  --   --   --  11.3*  HCT 43.8  --   --  39.5  --  41.7  --   --  36.4 37.3  --   --   --  32.4*  PLT 218  --   --  204  --  196  --   --  203 194  --   --   --  209  APTT  --   --  30  --   --   --   --   --   --   --   --   --   --   --   LABPROT 20.5*  --   --   --   --   --   --   --   --   --   --   --   --   --   INR 1.73  --   --   --   --   --   --   --   --   --   --   --   --   --   HEPARINUNFRC  --   < > <0.10* <0.10*  --   --   --   < > 0.24*  --   --  0.37 0.35 0.27*  CREATININE 1.23*  --   --  0.88  < >  --  0.80  --   --  0.62 0.67  --   --   --   CKTOTAL 743*  --   --   --   --   --   --   --  264*  --   --   --   --   --   TROPONINI  --   < > 0.12* 0.07*  0.13*  --  0.14*  --   --  0.13*  --   --   --   --   --   < > = values in this interval not displayed.   Assessment: 83yo female now slightly subtherapeutic on heparin after two levels at lower end of goal.  Goal of Therapy:  Heparin level 0.3-0.7 units/ml   Plan:  Will increase heparin gtt by 1-2 units/kg/hr to 1350 units/hr and check level in 8hr.  Vernard Gambles, PharmD, BCPS  10/31/2016,4:35 AM

## 2016-10-31 NOTE — Progress Notes (Signed)
RN unable to complete admission screening. Pt unable to answer questions. Pt refused to get OOB stating "Please just leave me alone". RN attempted to educate pt about the need to get OOB but pt went back to sleep. Pt states she does not want to eat but that she "feels much better" since having two bowel movements. Report called to [facility]. SWOT nurse to transfer ASAP. Will continue to monitor.

## 2016-10-31 NOTE — Progress Notes (Addendum)
PROGRESS NOTE    Haley Tucker   KHV:[id number]  DOB: Aug 04, 1933  DOA: 10/28/2016 PCP: No primary care provider on file.   Brief Narrative:  83 y/o female with a h/o Rheumatoid arthritis, frequent UTIs,   HTN who lives alone was brought in by EMS for confusion. She was found laying in feces and urine. Her family had not heard from her in 2 days and called the town hall who called first responders.  In the ER she was noted to be alert but disoriented. BP 90/60, HR in 110s, Temp 101.3 with a UA consistent with UTI, warm and erythematous right knee and tender abdomen.  -CT abd/pelvis was normal except for a large stool burden.  - She has had a right knee replacement and xrays reveal hardware to be stable and no abnormalities.  Lactic acid 3.93 Procalcitonin 5.34 CK total 743 WBV 17.2 with left shift BUN 376, Cr 1.23, sodium 134 Has mildly elevated troponins CT head normal - noted to have A-fib with RVR on admission- given aggressive fluid boluses and converted to NSR Noted to be hypoxic but had normal CXR Admitted for sepsis thought to be due to a UTI.   Subjective: Alert. Tells me she feels hungry. No complaints of pain, dyspnea, cough, nausea, vomiting.   Assessment & Plan:   Active Problems:   Severe sepsis (HCC) due to UTI - h/o frequent UTIs - vitals and labs as mentioned above consistent with severe sepsis -  lactic acid has normalized -  She has been receiving Vanc and Zosyn- Gr neg rods on U cultures- d/c Vanc - blood cultures continue to be NGTD - initial concern with right knee redness and swelling- was not a septic joint- right  knee erythema and tenderness has resolved- likely was injured from a fall from possibly laying on it for a prolonged period-  - abdomen was tender on admission as well- no signs of diverticulitis or colitis on CT   A-fib with RVR- has been paroxysmal - tells me that she has not history of this- her POA is not aware if she has a history of  this in the past  - converted to NSR In ER after fluid boluses but went back in to A-fib with RVR  . Was started on a Cardizem infusion which was titrate as high as 17 mg/hr (per night nurse) and failed to control HR. IV Metoprolol was ineffective as well-  Subsequently transitioned to an Amiodarone infusion -  switched to oral Amio and on Metoprolol (was on this at home)- continues to flip back and forth into A-fib  - ECHO noted below shows probably diastolic CHF, mild to mod MR and med TR -  thyroid functions WNL - CHA2DS2-VASc Score at least 4 - on heparin infusion which was started in the ER- switch to Eliquis for now -will need to assess fall risk before sending home on NOAC   Acute encephalopathy - CT head in ER unrevealing but at risk for CVA due to A-fib - likely due to UTI/ Sepsis- significantly improved today  Acute hypoxic resp failure- acute on chronic diastolic CHF - ? If initial hypoxia was due to sepsis and poor perfusion -  ungs were clear on exam- no respiratory distress- CXR clear on admission - 6/29- after aggressive hydration, CXR shows b/l effusions/ pulmonary edema - IVF stopped and lasix given- respiratory status stable, follow  Hypokalemia, Hypophosphatemia, hyponatremia - treating- follow  Abdominal tenderness and constipation - disimpaction done -  apparently she had 1 large BM  but xray still showing excess stool burden  - Enema given on 6/30  AKI - resolved with IVF- cont to follow  Hyponatremia - - likely is due to HCTZ which is on hold  Elevated Troponin - mild elevation noted  - suspect cardiac strain due to sepsis and A-fib - ECHO noted below shows no WMA  Rhabdomyolysis - resolving   Hypophosphatemia - replaced  Elevated T bili - follow- no abnormality noted on CT abd/pelvis- possibly due to sepsis- has resolved   DVT prophylaxis: Heparin infusion Code Status: DNR Family Communication: Adair Patter Disposition Plan: to be  determined Consultants:   none Procedures:  2 D ECHO Moderate LVH with LVEF 65-70%. Indeterminate diastolic function.   Mildly calcified mitral annulus with mild to moderate mitral   regurgitation. Calcified aortic annulus. Mildly dilated right   ventricle. Moderately dilated right atrium. Moderate tricuspid   regurgitation with PASP estimated 31 mmHg. Evidence of bilateral   pleural effusions.  Antimicrobials:  Anti-infectives    Start     Dose/Rate Route Frequency Ordered Stop   10/30/16 2000  piperacillin-tazobactam (ZOSYN) IVPB 3.375 g  Status:  Discontinued     3.375 g 12.5 mL/hr over 240 Minutes Intravenous Every 8 hours 10/29/16 1706 10/30/16 1223   10/30/16 1400  piperacillin-tazobactam (ZOSYN) IVPB 3.375 g     3.375 g 12.5 mL/hr over 240 Minutes Intravenous Every 8 hours 10/30/16 1223     10/30/16 1400  vancomycin (VANCOCIN) 500 mg in sodium chloride 0.9 % 100 mL IVPB  Status:  Discontinued     500 mg 100 mL/hr over 60 Minutes Intravenous Every 12 hours 10/30/16 1225 10/31/16 1203   10/29/16 1700  vancomycin (VANCOCIN) 500 mg in sodium chloride 0.9 % 100 mL IVPB  Status:  Discontinued     500 mg 100 mL/hr over 60 Minutes Intravenous Every 24 hours 10/28/16 1810 10/30/16 1225   10/28/16 2200  piperacillin-tazobactam (ZOSYN) IVPB 3.375 g  Status:  Discontinued     3.375 g 12.5 mL/hr over 240 Minutes Intravenous Every 8 hours 10/28/16 1810 10/29/16 1706   10/28/16 1700  piperacillin-tazobactam (ZOSYN) IVPB 3.375 g     3.375 g 100 mL/hr over 30 Minutes Intravenous  Once 10/28/16 1650 10/28/16 1750   10/28/16 1700  vancomycin (VANCOCIN) IVPB 1000 mg/200 mL premix     1,000 mg 200 mL/hr over 60 Minutes Intravenous  Once 10/28/16 1650 10/28/16 1853       Objective: Vitals:   10/31/16 0600 10/31/16 0800 10/31/16 1007 10/31/16 1200  BP: (!) 144/75  138/66 130/81  Pulse: 84  93 76  Resp: (!) 25   (!) 21  Temp:  98 F (36.7 C)  98.6 F (37 C)  TempSrc:  Oral  Oral   SpO2: 98%   96%  Weight:      Height:        Intake/Output Summary (Last 24 hours) at 10/31/16 1253 Last data filed at 10/31/16 0600  Gross per 24 hour  Intake            733.2 ml  Output             3750 ml  Net          -3016.8 ml   Filed Weights   10/29/16 0015 10/30/16 0400 10/31/16 0500  Weight: 62.6 kg (138 lb) 65.7 kg (144 lb 12.8 oz) 62.6 kg (138 lb)    Examination: General exam:  Appears comfortable - sleepy  HEENT: PERRLA, oral mucosa dry, no sclera icterus or thrush Respiratory system: Clear to auscultation. Respiratory effort normal. Cardiovascular system: S1 & S2 heard, RRR.  No murmurs  Gastrointestinal system: Abdomen soft,  Tender in right abdomen today, nondistended. Normal bowel sound. No organomegaly Central nervous system:   No focal neurological deficits.- oriented to person and place today Extremities: No cyanosis, clubbing or edema Skin: mild erythema of right knee, improved from yesterday- less tenderness Psychiatry:  Mood & affect appropriate.     Data Reviewed: I have personally reviewed following labs and imaging studies  CBC:  Recent Labs Lab 10/28/16 1655 10/29/16 0705 10/29/16 1226 10/30/16 0441 10/30/16 0926 10/31/16 0303  WBC 17.2* 11.0* 10.6* 10.1 8.9 7.9  NEUTROABS 14.3*  --   --   --   --   --   HGB 14.6 12.8 14.0 12.1 12.4 11.3*  HCT 43.8 39.5 41.7 36.4 37.3 32.4*  MCV 90.9 90.8 90.3 89.0 89.4 86.6  PLT 218 204 196 203 194 209   Basic Metabolic Panel:  Recent Labs Lab 10/28/16 2048 10/28/16 2320 10/29/16 0705 10/29/16 0740 10/29/16 1355 10/30/16 0441 10/30/16 0926 10/30/16 1208 10/31/16 0817  NA  --   --  134* 133* 135  --  134* 136 130*  K  --   --  3.6 3.7 3.7  --  3.0* 3.3* 2.4*  CL  --   --  105 105 108  --  108 110 100*  CO2  --   --  22 21* 17*  --  17* 16* 21*  GLUCOSE  --   --  123* 119* 89  --  113* 99 99  BUN  --   --  24* 24* 24*  --  20 21* 12  CREATININE  --   --  0.88 0.87 0.80  --  0.62 0.67 0.62   CALCIUM  --   --  7.2* 7.2* 7.6*  --  6.9* 6.8* 6.8*  MG 1.7  --  2.0  --   --   --   --   --  1.8  PHOS  --  2.1* 2.0*  --   --  1.5*  --  1.5* 1.2*   GFR: Estimated Creatinine Clearance: 48.8 mL/min (by C-G formula based on SCr of 0.62 mg/dL). Liver Function Tests:  Recent Labs Lab 10/28/16 1655 10/29/16 0705 10/29/16 1355 10/30/16 1208 10/31/16 0817  AST 55* 33 35 33 23  ALT 38 27 26 21 19   ALKPHOS 81 64 75 71 72  BILITOT 1.8* 1.6* 1.5* 1.3* 1.2  PROT 6.2* 5.3* 5.9* 5.3* 5.6*  ALBUMIN 2.8* 2.3* 2.5* 2.2* 2.2*    Recent Labs Lab 10/28/16 1655  LIPASE 27   No results for input(s): AMMONIA in the last 168 hours. Coagulation Profile:  Recent Labs Lab 10/28/16 1655  INR 1.73   Cardiac Enzymes:  Recent Labs Lab 10/28/16 1655 10/28/16 2320 10/29/16 0705 10/29/16 1226 10/30/16 0441  CKTOTAL 743*  --   --   --  264*  TROPONINI  --  0.12* 0.07*  0.13* 0.14* 0.13*   BNP (last 3 results) No results for input(s): PROBNP in the last 8760 hours. HbA1C: No results for input(s): HGBA1C in the last 72 hours. CBG: No results for input(s): GLUCAP in the last 168 hours. Lipid Profile: No results for input(s): CHOL, HDL, LDLCALC, TRIG, CHOLHDL, LDLDIRECT in the last 72 hours. Thyroid Function Tests:  Recent Labs  10/29/16  1355  TSH 1.306  FREET4 0.94   Anemia Panel: No results for input(s): VITAMINB12, FOLATE, FERRITIN, TIBC, IRON, RETICCTPCT in the last 72 hours. Urine analysis:    Component Value Date/Time   COLORURINE AMBER (A) 10/28/2016 1804   APPEARANCEUR CLOUDY (A) 10/28/2016 1804   LABSPEC 1.017 10/28/2016 1804   PHURINE 5.0 10/28/2016 1804   GLUCOSEU NEGATIVE 10/28/2016 1804   HGBUR MODERATE (A) 10/28/2016 1804   BILIRUBINUR NEGATIVE 10/28/2016 1804   KETONESUR 80 (A) 10/28/2016 1804   PROTEINUR 30 (A) 10/28/2016 1804   NITRITE POSITIVE (A) 10/28/2016 1804   LEUKOCYTESUR LARGE (A) 10/28/2016 1804   Sepsis  Labs: @LABRCNTIP (procalcitonin:4,lacticidven:4) ) Recent Results (from the past 240 hour(s))  Culture, blood (Routine x 2)     Status: None (Preliminary result)   Collection Time: 10/28/16  4:55 PM  Result Value Ref Range Status   Specimen Description BLOOD LEFT ANTECUBITAL  Final   Special Requests   Final    BOTTLES DRAWN AEROBIC AND ANAEROBIC Blood Culture adequate volume   Culture NO GROWTH 2 DAYS  Final   Report Status PENDING  Incomplete  Culture, blood (Routine x 2)     Status: None (Preliminary result)   Collection Time: 10/28/16  5:00 PM  Result Value Ref Range Status   Specimen Description BLOOD RIGHT ANTECUBITAL  Final   Special Requests   Final    BOTTLES DRAWN AEROBIC AND ANAEROBIC Blood Culture adequate volume   Culture NO GROWTH 2 DAYS  Final   Report Status PENDING  Incomplete  Urine culture     Status: Abnormal (Preliminary result)   Collection Time: 10/28/16  6:04 PM  Result Value Ref Range Status   Specimen Description URINE, CATHETERIZED  Final   Special Requests NONE  Final   Culture >=100,000 COLONIES/mL GRAM NEGATIVE RODS (A)  Final   Report Status PENDING  Incomplete  MRSA PCR Screening     Status: Abnormal   Collection Time: 10/28/16 10:57 PM  Result Value Ref Range Status   MRSA by PCR INVALID RESULTS, SPECIMEN SENT FOR CULTURE (A) NEGATIVE Final    Comment: J.WOODY,RN AT 0330 BY L.PITT 10/29/16        The GeneXpert MRSA Assay (FDA approved for NASAL specimens only), is one component of a comprehensive MRSA colonization surveillance program. It is not intended to diagnose MRSA infection nor to guide or monitor treatment for MRSA infections.   MRSA culture     Status: None   Collection Time: 10/28/16 10:57 PM  Result Value Ref Range Status   Specimen Description NASAL SWAB  Final   Special Requests NONE  Final   Culture NO MRSA DETECTED  Final   Report Status 10/30/2016 FINAL  Final  MRSA PCR Screening     Status: None   Collection Time:  10/30/16  6:43 PM  Result Value Ref Range Status   MRSA by PCR NEGATIVE NEGATIVE Final    Comment:        The GeneXpert MRSA Assay (FDA approved for NASAL specimens only), is one component of a comprehensive MRSA colonization surveillance program. It is not intended to diagnose MRSA infection nor to guide or monitor treatment for MRSA infections.          Radiology Studies: Dg Chest Port 1 View  Result Date: 10/30/2016 CLINICAL DATA:  Hypoxia. EXAM: PORTABLE CHEST 1 VIEW COMPARISON:  10/28/2016 FINDINGS: There is no opacity at both lung bases obscuring hemidiaphragms consistent with moderate bilateral effusions. Associated basilar opacity  in the lungs is likely atelectasis. Pneumonia is not excluded. No evidence of pulmonary edema. Heart is mildly enlarged.  No mediastinal or hilar masses. No pneumothorax. IMPRESSION: 1. New bilateral moderate size pleural effusions with associated lung base opacity, the latter likely atelectasis. No evidence of pulmonary edema. Electronically Signed   By: Amie Portland M.D.   On: 10/30/2016 08:41   Dg Abd Portable 1v  Result Date: 10/30/2016 CLINICAL DATA:  Abdominal tenderness.  History of a cholecystectomy. EXAM: PORTABLE ABDOMEN - 1 VIEW COMPARISON:  CT, 10/28/2016 FINDINGS: There is no bowel dilation to suggest obstruction. Mild increased stool is noted in the colon. There has been a prior cholecystectomy. Dense aortic and iliac vascular calcifications are noted. No evidence of a renal or ureteral stone. Soft tissues are otherwise unremarkable. There changes from the ORIF of a proximal right femur fracture. No acute skeletal abnormality. IMPRESSION: 1. No acute findings.  No evidence of bowel obstruction. Electronically Signed   By: Amie Portland M.D.   On: 10/30/2016 08:43      Scheduled Meds: . amiodarone  200 mg Oral Daily  . apixaban  5 mg Oral BID  . mouth rinse  15 mL Mouth Rinse BID  . metoprolol tartrate  25 mg Oral BID  .  pantoprazole (PROTONIX) IV  40 mg Intravenous QHS   Continuous Infusions: . sodium chloride 250 mL (10/28/16 2338)  . piperacillin-tazobactam (ZOSYN)  IV Stopped (10/31/16 1027)  . potassium chloride 10 mEq (10/31/16 1150)  . sodium phosphate  Dextrose 5% IVPB 30 mmol (10/31/16 1158)     LOS: 3 days    Time spent in minutes: 35    Calvert Cantor, MD [facility] Pager: [email or web address] Password [email or web address] 10/31/2016, 12:53 PM

## 2016-10-31 NOTE — Progress Notes (Signed)
ANTICOAGULATION CONSULT NOTE  Pharmacy Consult for Heparin >> Apixaban Indication: atrial fibrillation  No Known Allergies  Patient Measurements: Height: 5\' 2"  (157.5 cm) Weight: 138 lb (62.6 kg) IBW/kg (Calculated) : 50.1 Heparin Dosing Weight: 63 kg  Vital Signs: Temp: 98 F (36.7 C) (07/01 0800) Temp Source: Oral (07/01 0800) BP: 138/66 (07/01 1007) Pulse Rate: 93 (07/01 1007)  Labs:  Recent Labs  10/28/16 1655  10/28/16 2320 10/29/16 0705  10/29/16 1226  10/30/16 0441 10/30/16 0926 10/30/16 1208 10/30/16 1339 10/30/16 2312 10/31/16 0303 10/31/16 0817  HGB 14.6  --   --  12.8  --  14.0  --  12.1 12.4  --   --   --  11.3*  --   HCT 43.8  --   --  39.5  --  41.7  --  36.4 37.3  --   --   --  32.4*  --   PLT 218  --   --  204  --  196  --  203 194  --   --   --  209  --   APTT  --   --  30  --   --   --   --   --   --   --   --   --   --   --   LABPROT 20.5*  --   --   --   --   --   --   --   --   --   --   --   --   --   INR 1.73  --   --   --   --   --   --   --   --   --   --   --   --   --   HEPARINUNFRC  --   < > <0.10* <0.10*  --   --   < > 0.24*  --   --  0.37 0.35 0.27*  --   CREATININE 1.23*  --   --  0.88  < >  --   < >  --  0.62 0.67  --   --   --  0.62  CKTOTAL 743*  --   --   --   --   --   --  264*  --   --   --   --   --   --   TROPONINI  --   < > 0.12* 0.07*  0.13*  --  0.14*  --  0.13*  --   --   --   --   --   --   < > = values in this interval not displayed.  Estimated Creatinine Clearance: 48.8 mL/min (by C-G formula based on SCr of 0.62 mg/dL).  Assessment: 82 YOF brought in on 6/28 with AMS after being found down. The patient was started on heparin for atrial fibrillation however plans are now to transition the patient to apixaban. Age 4, wt>60 kg, SCr <1.5  Goal of Therapy:  Appropriate anticoagulation for indication and hepatic/renal function    Plan:  1. D/c Heparin 2. Start apixaban 5 mg twice daily 3. Will plan to educate prior  to discharge however will wait for AMS to improve 4. Pharmacy will sign off dosing consult but will monitor peripherally  Thank you for allowing pharmacy to be a part of this patient's care.  96, PharmD, BCPS Clinical Pharmacist Clinical  phone for 10/31/2016 from 7a-3:30p: [phone] If after 3:30p, please call main pharmacy at: [phone] 10/31/2016 11:20 AM

## 2016-10-31 NOTE — Progress Notes (Signed)
ANTICOAGULATION CONSULT NOTE - Follow Up Consult  Pharmacy Consult for heparin Indication: atrial fibrillation  Labs:  Recent Labs  10/28/16 1655  10/28/16 2320 10/29/16 0705  10/29/16 1226 10/29/16 1355  10/30/16 0441 10/30/16 0926 10/30/16 1208 10/30/16 1339 10/30/16 2312  HGB 14.6  --   --  12.8  --  14.0  --   --  12.1 12.4  --   --   --   HCT 43.8  --   --  39.5  --  41.7  --   --  36.4 37.3  --   --   --   PLT 218  --   --  204  --  196  --   --  203 194  --   --   --   APTT  --   --  30  --   --   --   --   --   --   --   --   --   --   LABPROT 20.5*  --   --   --   --   --   --   --   --   --   --   --   --   INR 1.73  --   --   --   --   --   --   --   --   --   --   --   --   HEPARINUNFRC  --   < > <0.10* <0.10*  --   --   --   < > 0.24*  --   --  0.37 0.35  CREATININE 1.23*  --   --  0.88  < >  --  0.80  --   --  0.62 0.67  --   --   CKTOTAL 743*  --   --   --   --   --   --   --  264*  --   --   --   --   TROPONINI  --   < > 0.12* 0.07*  0.13*  --  0.14*  --   --  0.13*  --   --   --   --   < > = values in this interval not displayed.   Assessment/Plan:  83yo female remains therapeutic on heparin. Will continue gtt at current rate and monitor daily level.   Vernard Gambles, PharmD, BCPS  10/31/2016,12:36 AM

## 2016-10-31 NOTE — Progress Notes (Signed)
Report received from [facility]. Pt arrived at the the unit at 1730.

## 2016-11-01 DIAGNOSIS — B370 Candidal stomatitis: Secondary | ICD-10-CM

## 2016-11-01 DIAGNOSIS — T799XXS Unspecified early complication of trauma, sequela: Secondary | ICD-10-CM

## 2016-11-01 DIAGNOSIS — N3000 Acute cystitis without hematuria: Secondary | ICD-10-CM

## 2016-11-01 LAB — CBC
HEMATOCRIT: 37.7 % (ref 36.0–46.0)
HEMOGLOBIN: 12.8 g/dL (ref 12.0–15.0)
MCH: 29.6 pg (ref 26.0–34.0)
MCHC: 34 g/dL (ref 30.0–36.0)
MCV: 87.3 fL (ref 78.0–100.0)
Platelets: 236 10*3/uL (ref 150–400)
RBC: 4.32 MIL/uL (ref 3.87–5.11)
RDW: 17.5 % — ABNORMAL HIGH (ref 11.5–15.5)
WBC: 6.6 10*3/uL (ref 4.0–10.5)

## 2016-11-01 LAB — PROCALCITONIN: PROCALCITONIN: 1.02 ng/mL

## 2016-11-01 LAB — BASIC METABOLIC PANEL
ANION GAP: 8 (ref 5–15)
BUN: 12 mg/dL (ref 6–20)
CHLORIDE: 103 mmol/L (ref 101–111)
CO2: 20 mmol/L — ABNORMAL LOW (ref 22–32)
Calcium: 6.7 mg/dL — ABNORMAL LOW (ref 8.9–10.3)
Creatinine, Ser: 0.57 mg/dL (ref 0.44–1.00)
Glucose, Bld: 83 mg/dL (ref 65–99)
POTASSIUM: 3.6 mmol/L (ref 3.5–5.1)
SODIUM: 131 mmol/L — AB (ref 135–145)

## 2016-11-01 LAB — URINE CULTURE: Culture: >=100000 — AB

## 2016-11-01 LAB — PHOSPHORUS: PHOSPHORUS: 1.6 mg/dL — AB (ref 2.5–4.6)

## 2016-11-01 LAB — MAGNESIUM: MAGNESIUM: 2.1 mg/dL (ref 1.7–2.4)

## 2016-11-01 MED ORDER — AMOXICILLIN 500 MG PO CAPS
500.0000 mg | ORAL_CAPSULE | Freq: Three times a day (TID) | ORAL | Status: DC
  Administered 2016-11-01 – 2016-11-04 (×9): 500 mg via ORAL
  Filled 2016-11-01 (×10): qty 1

## 2016-11-01 MED ORDER — FAMOTIDINE 20 MG PO TABS
10.0000 mg | ORAL_TABLET | Freq: Two times a day (BID) | ORAL | Status: DC
  Administered 2016-11-01 – 2016-11-04 (×7): 10 mg via ORAL
  Filled 2016-11-01 (×7): qty 1

## 2016-11-01 MED ORDER — FLUCONAZOLE 100 MG PO TABS: 100.0000 mg | ORAL_TABLET | Freq: Every day | ORAL | Status: DC

## 2016-11-01 MED ORDER — CLOTRIMAZOLE 10 MG MT TROC
10.0000 mg | Freq: Every day | OROMUCOSAL | Status: DC
  Administered 2016-11-01 – 2016-11-04 (×15): 10 mg via ORAL
  Filled 2016-11-01 (×19): qty 1

## 2016-11-01 MED ORDER — NYSTATIN 100000 UNIT/ML MT SUSP: 5.0000 mL | Freq: Four times a day (QID) | OROMUCOSAL | Status: DC

## 2016-11-01 MED ORDER — K PHOS MONO-SOD PHOS DI & MONO 155-852-130 MG PO TABS
500.0000 mg | ORAL_TABLET | Freq: Three times a day (TID) | ORAL | Status: DC
  Administered 2016-11-01 – 2016-11-04 (×14): 500 mg via ORAL
  Filled 2016-11-01 (×16): qty 2

## 2016-11-01 MED ORDER — PREDNISONE 5 MG PO TABS
5.0000 mg | ORAL_TABLET | Freq: Every day | ORAL | Status: DC
  Administered 2016-11-02 – 2016-11-04 (×3): 5 mg via ORAL
  Filled 2016-11-01 (×5): qty 1

## 2016-11-01 MED ORDER — SACCHAROMYCES BOULARDII 250 MG PO CAPS
250.0000 mg | ORAL_CAPSULE | Freq: Two times a day (BID) | ORAL | Status: DC
  Administered 2016-11-01 – 2016-11-04 (×7): 250 mg via ORAL
  Filled 2016-11-01 (×7): qty 1

## 2016-11-01 MED ORDER — AMOXICILLIN 500 MG PO CAPS: 500.0000 mg | ORAL_CAPSULE | Freq: Four times a day (QID) | ORAL | Status: DC

## 2016-11-01 NOTE — Progress Notes (Signed)
PROGRESS NOTE    Haley Tucker   WUJ:[id number]  DOB: December 30, 1933  DOA: 10/28/2016 PCP: No primary care provider on file.   Brief Narrative:  83 y/o female with a h/o Rheumatoid arthritis, frequent UTIs,   HTN who lives alone was brought in by EMS for confusion. She was found laying in feces and urine. Her family had not heard from her in 2 days and called the town hall who called first responders.  In the ER she was noted to be alert but disoriented. BP 90/60, HR in 110s, Temp 101.3 with a UA consistent with UTI, warm and erythematous right knee and tender abdomen.  -CT abd/pelvis was normal except for a large stool burden.  - She has had a right knee replacement and xrays reveal hardware to be stable and no abnormalities.  Lactic acid 3.93 Procalcitonin 5.34 CK total 743 WBV 17.2 with left shift BUN 376, Cr 1.23, sodium 134 Has mildly elevated troponins CT head normal - noted to have A-fib with RVR on admission- given aggressive fluid boluses and converted to NSR Noted to be hypoxic but had normal CXR Admitted for sepsis thought to be due to a UTI.   Subjective: Alert. Thinks she is in the hospital for heart issues. Has forgotten we discussed yesterday that he has a UTI. She has no complaints of pain, nausea, vomiting, dyspnea or cough.   Assessment & Plan:   Active Problems:   Severe sepsis (HCC) due to UTI - h/o frequent UTIs - vitals and labs as mentioned above consistent with severe sepsis -  lactic acid has normalized -  She has been receiving Vanc and Zosyn- Gr neg rods on U cultures- d/c'd Vanc - final cultures results show E coli sensitive to Ampicillin to which she has been switched today - blood cultures continue to be NGTD - initial concern with right knee redness and swelling- was not a septic joint- right  knee erythema and tenderness has resolved- likely was injured from a fall from possibly laying on it for a prolonged period-  - abdomen was tender on  admission as well- no signs of diverticulitis or colitis on CT   A-fib with RVR- has been paroxysmal - tells me that she has not history of this- her POA is not aware if she has a history of this in the past  - converted to NSR In ER after fluid boluses but went back in to A-fib with RVR  . Was started on a Cardizem infusion which was titrate as high as 17 mg/hr (per night nurse) and failed to control HR. IV Metoprolol was ineffective as well-  Subsequently transitioned to an Amiodarone infusion -  Cardiology consulted: switched to oral Amio - already on Metoprolol (was on this at home)- continues to flip back and forth into A-fib  - ECHO noted below shows probable diastolic CHF  -  thyroid functions WNL - CHA2DS2-VASc Score at least 4 - on heparin infusion which was started in the ER- switch to Eliquis for now while in hospital -will need to assess fall risk before sending home on NOAC- still awaiting PT eval today   Thrush - Fluconazole will interact with Amiodarone - try Mycelex Troch  LVH with indeterminate diastolic function, Mo TR  - PASP 31 - follow for heart failure   Acute encephalopathy - CT head in ER unrevealing but at risk for CVA due to A-fib - likely due to UTI/ Sepsis - significantly improved  Acute hypoxic resp failure- acute on chronic diastolic CHF - ? If initial hypoxia was due to sepsis and poor perfusion -  ungs were clear on exam- no respiratory distress- CXR clear on admission - 6/29- after aggressive hydration, CXR shows b/l effusions/ pulmonary edema - IVF stopped and lasix given- respiratory status stable, following   Hypokalemia, Hypophosphatemia, hyponatremia - treating- follow  Abdominal tenderness and constipation - disimpaction done - apparently she had 1 large BM  but xray still showing excess stool burden  - Enema given on 6/30- no results - 7/2 - Mag Citrate given - had 2 large BM  AKI - resolved with IVF- cont to follow  Hyponatremia -  - likely is due to HCTZ which is on hold  Elevated Troponin - mild elevation noted  - suspect cardiac strain due to sepsis and A-fib - ECHO noted below shows no WMA  Rhabdomyolysis - resolved  Hypophosphatemia - replaced  Elevated T bili - follow- no abnormality noted on CT abd/pelvis- possibly due to sepsis- has resolved   DVT prophylaxis: Heparin infusion>> Eliquis Code Status: DNR Family Communication: Adair Patter Disposition Plan: to be determined- awaiting PT eval- patient ambulatory at home but did not want to get out of bed yesterday Consultants:   none Procedures:  2 D ECHO Moderate LVH with LVEF 65-70%. Indeterminate diastolic function.   Mildly calcified mitral annulus with mild to moderate mitral   regurgitation. Calcified aortic annulus. Mildly dilated right   ventricle. Moderately dilated right atrium. Moderate tricuspid   regurgitation with PASP estimated 31 mmHg. Evidence of bilateral   pleural effusions.  Antimicrobials:  Anti-infectives    Start     Dose/Rate Route Frequency Ordered Stop   11/01/16 1400  amoxicillin (AMOXIL) capsule 500 mg     500 mg Oral Every 8 hours 11/01/16 1030     11/01/16 1100  fluconazole (DIFLUCAN) tablet 100 mg  Status:  Discontinued     100 mg Oral Daily 11/01/16 1030 11/01/16 1046   11/01/16 1015  amoxicillin (AMOXIL) capsule 500 mg  Status:  Discontinued     500 mg Oral 4 times daily 11/01/16 1009 11/01/16 1030   10/30/16 2000  piperacillin-tazobactam (ZOSYN) IVPB 3.375 g  Status:  Discontinued     3.375 g 12.5 mL/hr over 240 Minutes Intravenous Every 8 hours 10/29/16 1706 10/30/16 1223   10/30/16 1400  piperacillin-tazobactam (ZOSYN) IVPB 3.375 g  Status:  Discontinued     3.375 g 12.5 mL/hr over 240 Minutes Intravenous Every 8 hours 10/30/16 1223 11/01/16 1014   10/30/16 1400  vancomycin (VANCOCIN) 500 mg in sodium chloride 0.9 % 100 mL IVPB  Status:  Discontinued     500 mg 100 mL/hr over 60 Minutes Intravenous  Every 12 hours 10/30/16 1225 10/31/16 1203   10/29/16 1700  vancomycin (VANCOCIN) 500 mg in sodium chloride 0.9 % 100 mL IVPB  Status:  Discontinued     500 mg 100 mL/hr over 60 Minutes Intravenous Every 24 hours 10/28/16 1810 10/30/16 1225   10/28/16 2200  piperacillin-tazobactam (ZOSYN) IVPB 3.375 g  Status:  Discontinued     3.375 g 12.5 mL/hr over 240 Minutes Intravenous Every 8 hours 10/28/16 1810 10/29/16 1706   10/28/16 1700  piperacillin-tazobactam (ZOSYN) IVPB 3.375 g     3.375 g 100 mL/hr over 30 Minutes Intravenous  Once 10/28/16 1650 10/28/16 1750   10/28/16 1700  vancomycin (VANCOCIN) IVPB 1000 mg/200 mL premix     1,000 mg 200 mL/hr  over 60 Minutes Intravenous  Once 10/28/16 1650 10/28/16 1853       Objective: Vitals:   10/31/16 1729 10/31/16 2102 11/01/16 0619 11/01/16 0933  BP: 122/65 125/63 135/62 138/64  Pulse: 81 85 77 79  Resp: 20 18 18    Temp: 99.1 F (37.3 C) 99.8 F (37.7 C) 98.6 F (37 C)   TempSrc:   Oral   SpO2: 96% 98% 99%   Weight:   63 kg (138 lb 14.2 oz)   Height:        Intake/Output Summary (Last 24 hours) at 11/01/16 1110 Last data filed at 11/01/16 0957  Gross per 24 hour  Intake          1385.83 ml  Output             1700 ml  Net          -314.17 ml   Filed Weights   10/30/16 0400 10/31/16 0500 11/01/16 0619  Weight: 65.7 kg (144 lb 12.8 oz) 62.6 kg (138 lb) 63 kg (138 lb 14.2 oz)    Examination: General exam: Appears comfortable - alert today HEENT: PERRLA, oral mucosa dry, no sclera icterus or thrush Respiratory system: Clear to auscultation. Respiratory effort normal. Cardiovascular system: S1 & S2 heard, RRR.  No murmurs  Gastrointestinal system: Abdomen soft,  Non-tender abdomen today nondistended. Normal bowel sound. No organomegaly Central nervous system:   No focal neurological deficits.- oriented to person and place and partly to situation Extremities: No cyanosis, clubbing or edema Skin:  rythema of right knee rrsolved  completely - knee non-tender, mild erythema of right wrist- non- tender Psychiatry:  Mood & affect appropriate.     Data Reviewed: I have personally reviewed following labs and imaging studies  CBC:  Recent Labs Lab 10/28/16 1655  10/29/16 1226 10/30/16 0441 10/30/16 0926 10/31/16 0303 11/01/16 0538  WBC 17.2*  < > 10.6* 10.1 8.9 7.9 6.6  NEUTROABS 14.3*  --   --   --   --   --   --   HGB 14.6  < > 14.0 12.1 12.4 11.3* 12.8  HCT 43.8  < > 41.7 36.4 37.3 32.4* 37.7  MCV 90.9  < > 90.3 89.0 89.4 86.6 87.3  PLT 218  < > 196 203 194 209 236  < > = values in this interval not displayed. Basic Metabolic Panel:  Recent Labs Lab 10/28/16 2048  10/29/16 0705  10/30/16 0441 10/30/16 0926 10/30/16 1208 10/31/16 0817 10/31/16 1541 11/01/16 0538  NA  --   --  134*  < >  --  134* 136 130* 131* 131*  K  --   --  3.6  < >  --  3.0* 3.3* 2.4* 3.5 3.6  CL  --   --  105  < >  --  108 110 100* 101 103  CO2  --   --  22  < >  --  17* 16* 21* 21* 20*  GLUCOSE  --   --  123*  < >  --  113* 99 99 123* 83  BUN  --   --  24*  < >  --  20 21* 12 14 12   CREATININE  --   --  0.88  < >  --  0.62 0.67 0.62 0.64 0.57  CALCIUM  --   --  7.2*  < >  --  6.9* 6.8* 6.8* 6.6* 6.7*  MG 1.7  --  2.0  --   --   --   --  1.8  --  2.1  PHOS  --   < > 2.0*  --  1.5*  --  1.5* 1.2*  --  1.6*  < > = values in this interval not displayed. GFR: Estimated Creatinine Clearance: 49 mL/min (by C-G formula based on SCr of 0.57 mg/dL). Liver Function Tests:  Recent Labs Lab 10/28/16 1655 10/29/16 0705 10/29/16 1355 10/30/16 1208 10/31/16 0817  AST 55* 33 35 33 23  ALT 38 27 26 21 19   ALKPHOS 81 64 75 71 72  BILITOT 1.8* 1.6* 1.5* 1.3* 1.2  PROT 6.2* 5.3* 5.9* 5.3* 5.6*  ALBUMIN 2.8* 2.3* 2.5* 2.2* 2.2*    Recent Labs Lab 10/28/16 1655  LIPASE 27   No results for input(s): AMMONIA in the last 168 hours. Coagulation Profile:  Recent Labs Lab 10/28/16 1655  INR 1.73   Cardiac Enzymes:  Recent  Labs Lab 10/28/16 1655 10/28/16 2320 10/29/16 0705 10/29/16 1226 10/30/16 0441  CKTOTAL 743*  --   --   --  264*  TROPONINI  --  0.12* 0.07*  0.13* 0.14* 0.13*   BNP (last 3 results) No results for input(s): PROBNP in the last 8760 hours. HbA1C: No results for input(s): HGBA1C in the last 72 hours. CBG: No results for input(s): GLUCAP in the last 168 hours. Lipid Profile: No results for input(s): CHOL, HDL, LDLCALC, TRIG, CHOLHDL, LDLDIRECT in the last 72 hours. Thyroid Function Tests:  Recent Labs  10/29/16 1355  TSH 1.306  FREET4 0.94   Anemia Panel: No results for input(s): VITAMINB12, FOLATE, FERRITIN, TIBC, IRON, RETICCTPCT in the last 72 hours. Urine analysis:    Component Value Date/Time   COLORURINE AMBER (A) 10/28/2016 1804   APPEARANCEUR CLOUDY (A) 10/28/2016 1804   LABSPEC 1.017 10/28/2016 1804   PHURINE 5.0 10/28/2016 1804   GLUCOSEU NEGATIVE 10/28/2016 1804   HGBUR MODERATE (A) 10/28/2016 1804   BILIRUBINUR NEGATIVE 10/28/2016 1804   KETONESUR 80 (A) 10/28/2016 1804   PROTEINUR 30 (A) 10/28/2016 1804   NITRITE POSITIVE (A) 10/28/2016 1804   LEUKOCYTESUR LARGE (A) 10/28/2016 1804   Sepsis Labs: @LABRCNTIP (procalcitonin:4,lacticidven:4) ) Recent Results (from the past 240 hour(s))  Culture, blood (Routine x 2)     Status: None (Preliminary result)   Collection Time: 10/28/16  4:55 PM  Result Value Ref Range Status   Specimen Description BLOOD LEFT ANTECUBITAL  Final   Special Requests   Final    BOTTLES DRAWN AEROBIC AND ANAEROBIC Blood Culture adequate volume   Culture NO GROWTH 3 DAYS  Final   Report Status PENDING  Incomplete  Culture, blood (Routine x 2)     Status: None (Preliminary result)   Collection Time: 10/28/16  5:00 PM  Result Value Ref Range Status   Specimen Description BLOOD RIGHT ANTECUBITAL  Final   Special Requests   Final    BOTTLES DRAWN AEROBIC AND ANAEROBIC Blood Culture adequate volume   Culture NO GROWTH 3 DAYS  Final     Report Status PENDING  Incomplete  Urine culture     Status: Abnormal   Collection Time: 10/28/16  6:04 PM  Result Value Ref Range Status   Specimen Description URINE, CATHETERIZED  Final   Special Requests NONE  Final   Culture >=100,000 COLONIES/mL ESCHERICHIA COLI (A)  Final   Report Status 11/01/2016 FINAL  Final   Organism ID, Bacteria ESCHERICHIA COLI (A)  Final      Susceptibility   Escherichia coli - MIC*    AMPICILLIN <=2  SENSITIVE Sensitive     CEFAZOLIN <=4 SENSITIVE Sensitive     CEFTRIAXONE <=1 SENSITIVE Sensitive     CIPROFLOXACIN <=0.25 SENSITIVE Sensitive     GENTAMICIN <=1 SENSITIVE Sensitive     IMIPENEM <=0.25 SENSITIVE Sensitive     NITROFURANTOIN <=16 SENSITIVE Sensitive     TRIMETH/SULFA <=20 SENSITIVE Sensitive     AMPICILLIN/SULBACTAM <=2 SENSITIVE Sensitive     PIP/TAZO <=4 SENSITIVE Sensitive     Extended ESBL NEGATIVE Sensitive     * >=100,000 COLONIES/mL ESCHERICHIA COLI  MRSA PCR Screening     Status: Abnormal   Collection Time: 10/28/16 10:57 PM  Result Value Ref Range Status   MRSA by PCR INVALID RESULTS, SPECIMEN SENT FOR CULTURE (A) NEGATIVE Final    Comment: J.WOODY,RN AT 0330 BY L.PITT 10/29/16        The GeneXpert MRSA Assay (FDA approved for NASAL specimens only), is one component of a comprehensive MRSA colonization surveillance program. It is not intended to diagnose MRSA infection nor to guide or monitor treatment for MRSA infections.   MRSA culture     Status: None   Collection Time: 10/28/16 10:57 PM  Result Value Ref Range Status   Specimen Description NASAL SWAB  Final   Special Requests NONE  Final   Culture NO MRSA DETECTED  Final   Report Status 10/30/2016 FINAL  Final  MRSA PCR Screening     Status: None   Collection Time: 10/30/16  6:43 PM  Result Value Ref Range Status   MRSA by PCR NEGATIVE NEGATIVE Final    Comment:        The GeneXpert MRSA Assay (FDA approved for NASAL specimens only), is one component of  a comprehensive MRSA colonization surveillance program. It is not intended to diagnose MRSA infection nor to guide or monitor treatment for MRSA infections.          Radiology Studies: No results found.    Scheduled Meds: . amiodarone  200 mg Oral Daily  . amoxicillin  500 mg Oral Q8H  . apixaban  5 mg Oral BID  . clotrimazole  10 mg Oral 5 X Daily  . famotidine  10 mg Oral BID  . mouth rinse  15 mL Mouth Rinse BID  . metoprolol tartrate  25 mg Oral BID  . phosphorus  500 mg Oral TID WC & HS  . [START ON 11/02/2016] predniSONE  5 mg Oral Q breakfast  . saccharomyces boulardii  250 mg Oral BID   Continuous Infusions: . sodium chloride 250 mL (10/28/16 2338)     LOS: 4 days    Time spent in minutes: 35    Calvert Cantor, MD [facility] Pager: [email or web address] Password [email or web address] 11/01/2016, 11:10 AM

## 2016-11-01 NOTE — NC FL2 (Signed)
[place] MEDICAID FL2 LEVEL OF CARE SCREENING TOOL     IDENTIFICATION  Patient Name: Haley Tucker Birthdate: 06-21-1933 Sex: female Admission Date (Current Location): 10/28/2016  [facility] and [place] Number:  [employer] and Address:  [facility] [place]. [facility], [address], [place], [place] [address]      Provider Number: [id number]  Attending Physician Name and Address:  Calvert Cantor, MD  Relative Name and Phone Number:  Larence Penning, [phone]    Current Level of Care: Hospital Recommended Level of Care: Skilled Nursing Facility Prior Approval Number:    Date Approved/Denied:   PASRR Number:    Discharge Plan: SNF    Current Diagnoses: Patient Active Problem List   Diagnosis Date Noted  . Acute diastolic (congestive) heart failure (HCC) 10/30/2016  . Rhabdomyolysis 10/29/2016  . Acute cystitis with hematuria   . Atrial fibrillation with RVR (HCC)   . Acute metabolic encephalopathy   . Severe sepsis (HCC) 10/28/2016    Orientation RESPIRATION BLADDER Height & Weight     Self, Place  O2 (Nasal cannula 3L) Continent, Indwelling catheter Weight: 63 kg (138 lb 14.2 oz) Height:  5\' 2"  (157.5 cm)  BEHAVIORAL SYMPTOMS/MOOD NEUROLOGICAL BOWEL NUTRITION STATUS      Continent Diet (Please see DC Summary)  AMBULATORY STATUS COMMUNICATION OF NEEDS Skin   Extensive Assist Verbally Normal                       Personal Care Assistance Level of Assistance  Bathing, Feeding, Dressing Bathing Assistance: Maximum assistance Feeding assistance: Limited assistance Dressing Assistance: Limited assistance     Functional Limitations Info             SPECIAL CARE FACTORS FREQUENCY  PT (By licensed PT)     PT Frequency: not yet assessed              Contractures      Additional Factors Info  Code Status, Allergies Code Status Info: DNR Allergies Info: NKA           Current Medications (11/01/2016):  This is the current  hospital active medication list Current Facility-Administered Medications  Medication Dose Route Frequency Provider Last Rate Last Dose  . 0.9 %  sodium chloride infusion  250 mL Intravenous PRN 01/02/2017, NP 20 mL/hr at 10/28/16 2338 250 mL at 10/28/16 2338  . amiodarone (PACERONE) tablet 200 mg  200 mg Oral Daily 2339, MD   200 mg at 11/01/16 0933  . amoxicillin (AMOXIL) capsule 500 mg  500 mg Oral Q8H Rizwan, Saima, MD      . apixaban (ELIQUIS) tablet 5 mg  5 mg Oral BID 01/02/17, RPH   5 mg at 11/01/16 01/02/17  . clotrimazole (MYCELEX) troche 10 mg  10 mg Oral 5 X Daily Rizwan, Saima, MD      . famotidine (PEPCID) tablet 10 mg  10 mg Oral BID 3007, MD      . MEDLINE mouth rinse  15 mL Mouth Rinse BID Calvert Cantor, MD   15 mL at 11/01/16 0934  . metoprolol tartrate (LOPRESSOR) injection 2.5 mg  2.5 mg Intravenous PRN 01/02/17, MD   2.5 mg at 10/31/16 0821  . metoprolol tartrate (LOPRESSOR) tablet 25 mg  25 mg Oral BID 01/01/17, MD   25 mg at 11/01/16 0933  . phosphorus (K PHOS NEUTRAL) tablet 500 mg  500 mg Oral TID  WC & HS Calvert Cantor, MD   500 mg at 11/01/16 0932  . [START ON 11/02/2016] predniSONE (DELTASONE) tablet 5 mg  5 mg Oral Q breakfast Rizwan, Ladell Heads, MD      . saccharomyces boulardii (FLORASTOR) capsule 250 mg  250 mg Oral BID Calvert Cantor, MD         Discharge Medications: Please see discharge summary for a list of discharge medications.  Relevant Imaging Results:  Relevant Lab Results:   Additional Information    Mearl Latin, LCSWA

## 2016-11-01 NOTE — Progress Notes (Signed)
DAILY PROGRESS NOTE   Patient Name: Haley Tucker Date of Encounter: 11/01/2016   Primary Cardiologist [facility] Problem List   Principal Problem:   Acute metabolic encephalopathy Active Problems:   Severe sepsis ([place])   Acute cystitis with hematuria   Atrial fibrillation with RVR (HCC)   Rhabdomyolysis   Acute diastolic (congestive) heart failure [facility])    Chief Complaint   Lethargic no palpitations   Subjective   Maint NSR   Objective   Vitals:   10/31/16 1516 10/31/16 1729 10/31/16 2102 11/01/16 0619  BP: 131/66 122/65 125/63 135/62  Pulse: 79 81 85 77  Resp: _0 Temp: 98.7 F (37.1 C) 99.1 F (37.3 C) 99.8 F (37.7 C) 98.6 F (37 C)  TempSrc: Oral   Oral  SpO2: 100% 96% 98% 99%  Weight:    63 kg (138 lb 14.2 oz)  Height:        Intake/Output Summary (Last 24 hours) at 11/01/16 0752 Last data filed at 11/01/16 9323  Gross per 24 hour  Intake          1535.83 ml  Output             1150 ml  Net           385.83 ml   Filed Weights   10/30/16 0400 10/31/16 0500 11/01/16 5573  Weight: 65.7 kg (144 lb 12.8 oz) 62.6 kg (138 lb) 63 kg (138 lb 14.2 oz)    Physical Exam   General appearance: alert and no distress Neck: no carotid bruit and no JVD Lungs: clear to auscultation bilaterally Heart: regular rate and rhythm Abdomen: soft, non-tender; bowel sounds normal; no masses,  no organomegaly Extremities: edema trace pedal Pulses: 2+ and symmetric Skin: Skin color, texture, turgor normal. No rashes or lesions Neurologic: Grossly normal Psych: Flat affect  Inpatient Medications    Scheduled Meds: . amiodarone  200 mg Oral Daily  . apixaban  5 mg Oral BID  . mouth rinse  15 mL Mouth Rinse BID  . metoprolol tartrate  25 mg Oral BID  . pantoprazole (PROTONIX) IV  40 mg Intravenous QHS  . phosphorus  500 mg Oral TID WC & HS    Continuous Infusions: . sodium chloride 250 mL (10/28/16 2338)  . piperacillin-tazobactam (ZOSYN)  IV  3.375 g (11/01/16 0626)    PRN Meds: sodium chloride, metoprolol tartrate   Labs   Results for orders placed or performed during the hospital encounter of 10/28/16 (from the past 48 hour(s))  Basic metabolic panel     Status: Abnormal   Collection Time: 10/30/16  9:26 AM  Result Value Ref Range   Sodium 134 (L) 135 - 145 mmol/L   Potassium 3.0 (L) 3.5 - 5.1 mmol/L   Chloride 108 101 - 111 mmol/L   CO2 17 (L) 22 - 32 mmol/L   Glucose, Bld 113 (H) 65 - 99 mg/dL   BUN 20 6 - 20 mg/dL   Creatinine, Ser 0.62 0.44 - 1.00 mg/dL   Calcium 6.9 (L) 8.9 - 10.3 mg/dL   GFR calc non Af Amer >60 >60 mL/min   GFR calc Af Amer >60 >60 mL/min    Comment: (NOTE) The eGFR has been calculated using the CKD EPI equation. This calculation has not been validated in all clinical situations. eGFR's persistently <60 mL/min signify possible Chronic Kidney Disease.    Anion gap 9 5 - 15  CBC  Status: Abnormal   Collection Time: 10/30/16  9:26 AM  Result Value Ref Range   WBC 8.9 4.0 - 10.5 K/uL   RBC 4.17 3.87 - 5.11 MIL/uL   Hemoglobin 12.4 12.0 - 15.0 g/dL   HCT 37.3 36.0 - 46.0 %   MCV 89.4 78.0 - 100.0 fL   MCH 29.7 26.0 - 34.0 pg   MCHC 33.2 30.0 - 36.0 g/dL   RDW 17.8 (H) 11.5 - 15.5 %   Platelets 194 150 - 400 K/uL  Comprehensive metabolic panel     Status: Abnormal   Collection Time: 10/30/16 12:08 PM  Result Value Ref Range   Sodium 136 135 - 145 mmol/L   Potassium 3.3 (L) 3.5 - 5.1 mmol/L   Chloride 110 101 - 111 mmol/L   CO2 16 (L) 22 - 32 mmol/L   Glucose, Bld 99 65 - 99 mg/dL   BUN 21 (H) 6 - 20 mg/dL   Creatinine, Ser 0.67 0.44 - 1.00 mg/dL   Calcium 6.8 (L) 8.9 - 10.3 mg/dL   Total Protein 5.3 (L) 6.5 - 8.1 g/dL   Albumin 2.2 (L) 3.5 - 5.0 g/dL   AST 33 15 - 41 U/L   ALT 21 14 - 54 U/L   Alkaline Phosphatase 71 38 - 126 U/L   Total Bilirubin 1.3 (H) 0.3 - 1.2 mg/dL   GFR calc non Af Amer >60 >60 mL/min   GFR calc Af Amer >60 >60 mL/min    Comment: (NOTE) The eGFR  has been calculated using the CKD EPI equation. This calculation has not been validated in all clinical situations. eGFR's persistently <60 mL/min signify possible Chronic Kidney Disease.    Anion gap 10 5 - 15  Phosphorus     Status: Abnormal   Collection Time: 10/30/16 12:08 PM  Result Value Ref Range   Phosphorus 1.5 (L) 2.5 - 4.6 mg/dL  Heparin level (unfractionated)     Status: None   Collection Time: 10/30/16  1:39 PM  Result Value Ref Range   Heparin Unfractionated 0.37 0.30 - 0.70 IU/mL    Comment:        IF HEPARIN RESULTS ARE BELOW EXPECTED VALUES, AND PATIENT DOSAGE HAS BEEN CONFIRMED, SUGGEST FOLLOW UP TESTING OF ANTITHROMBIN III LEVELS.   MRSA PCR Screening     Status: None   Collection Time: 10/30/16  6:43 PM  Result Value Ref Range   MRSA by PCR NEGATIVE NEGATIVE    Comment:        The GeneXpert MRSA Assay (FDA approved for NASAL specimens only), is one component of a comprehensive MRSA colonization surveillance program. It is not intended to diagnose MRSA infection nor to guide or monitor treatment for MRSA infections.   Heparin level (unfractionated)     Status: None   Collection Time: 10/30/16 11:12 PM  Result Value Ref Range   Heparin Unfractionated 0.35 0.30 - 0.70 IU/mL    Comment:        IF HEPARIN RESULTS ARE BELOW EXPECTED VALUES, AND PATIENT DOSAGE HAS BEEN CONFIRMED, SUGGEST FOLLOW UP TESTING OF ANTITHROMBIN III LEVELS.   CBC     Status: Abnormal   Collection Time: 10/31/16  3:03 AM  Result Value Ref Range   WBC 7.9 4.0 - 10.5 K/uL   RBC 3.74 (L) 3.87 - 5.11 MIL/uL   Hemoglobin 11.3 (L) 12.0 - 15.0 g/dL   HCT 32.4 (L) 36.0 - 46.0 %   MCV 86.6 78.0 - 100.0 fL  MCH 30.2 26.0 - 34.0 pg   MCHC 34.9 30.0 - 36.0 g/dL   RDW 08.8 (H) 83.5 - 84.4 %   Platelets 209 150 - 400 K/uL  Heparin level (unfractionated)     Status: Abnormal   Collection Time: 10/31/16  3:03 AM  Result Value Ref Range   Heparin Unfractionated 0.27 (L) 0.30 - 0.70  IU/mL    Comment:        IF HEPARIN RESULTS ARE BELOW EXPECTED VALUES, AND PATIENT DOSAGE HAS BEEN CONFIRMED, SUGGEST FOLLOW UP TESTING OF ANTITHROMBIN III LEVELS.   Comprehensive metabolic panel     Status: Abnormal   Collection Time: 10/31/16  8:17 AM  Result Value Ref Range   Sodium 130 (L) 135 - 145 mmol/L   Potassium 2.4 (LL) 3.5 - 5.1 mmol/L    Comment: CRITICAL RESULT CALLED TO, READ BACK BY AND VERIFIED WITH: J.LOVE RN @ [phone] 10/31/16 BY C.EDENS    Chloride 100 (L) 101 - 111 mmol/L   CO2 21 (L) 22 - 32 mmol/L   Glucose, Bld 99 65 - 99 mg/dL   BUN 12 6 - 20 mg/dL   Creatinine, Ser 7.61 0.44 - 1.00 mg/dL   Calcium 6.8 (L) 8.9 - 10.3 mg/dL   Total Protein 5.6 (L) 6.5 - 8.1 g/dL   Albumin 2.2 (L) 3.5 - 5.0 g/dL   AST 23 15 - 41 U/L   ALT 19 14 - 54 U/L   Alkaline Phosphatase 72 38 - 126 U/L   Total Bilirubin 1.2 0.3 - 1.2 mg/dL   GFR calc non Af Amer >60 >60 mL/min   GFR calc Af Amer >60 >60 mL/min    Comment: (NOTE) The eGFR has been calculated using the CKD EPI equation. This calculation has not been validated in all clinical situations. eGFR's persistently <60 mL/min signify possible Chronic Kidney Disease.    Anion gap 9 5 - 15  Magnesium     Status: None   Collection Time: 10/31/16  8:17 AM  Result Value Ref Range   Magnesium 1.8 1.7 - 2.4 mg/dL  Phosphorus     Status: Abnormal   Collection Time: 10/31/16  8:17 AM  Result Value Ref Range   Phosphorus 1.2 (L) 2.5 - 4.6 mg/dL  Basic metabolic panel     Status: Abnormal   Collection Time: 10/31/16  3:41 PM  Result Value Ref Range   Sodium 131 (L) 135 - 145 mmol/L   Potassium 3.5 3.5 - 5.1 mmol/L    Comment: DELTA CHECK NOTED   Chloride 101 101 - 111 mmol/L   CO2 21 (L) 22 - 32 mmol/L   Glucose, Bld 123 (H) 65 - 99 mg/dL   BUN 14 6 - 20 mg/dL   Creatinine, Ser 9.15 0.44 - 1.00 mg/dL   Calcium 6.6 (L) 8.9 - 10.3 mg/dL   GFR calc non Af Amer >60 >60 mL/min   GFR calc Af Amer >60 >60 mL/min    Comment:  (NOTE) The eGFR has been calculated using the CKD EPI equation. This calculation has not been validated in all clinical situations. eGFR's persistently <60 mL/min signify possible Chronic Kidney Disease.    Anion gap 9 5 - 15  CBC     Status: Abnormal   Collection Time: 11/01/16  5:38 AM  Result Value Ref Range   WBC 6.6 4.0 - 10.5 K/uL   RBC 4.32 3.87 - 5.11 MIL/uL   Hemoglobin 12.8 12.0 - 15.0 g/dL   HCT 37.7  36.0 - 46.0 %   MCV 87.3 78.0 - 100.0 fL   MCH 29.6 26.0 - 34.0 pg   MCHC 34.0 30.0 - 36.0 g/dL   RDW 17.5 (H) 11.5 - 15.5 %   Platelets 236 150 - 400 K/uL  Basic metabolic panel     Status: Abnormal   Collection Time: 11/01/16  5:38 AM  Result Value Ref Range   Sodium 131 (L) 135 - 145 mmol/L   Potassium 3.6 3.5 - 5.1 mmol/L   Chloride 103 101 - 111 mmol/L   CO2 20 (L) 22 - 32 mmol/L   Glucose, Bld 83 65 - 99 mg/dL   BUN 12 6 - 20 mg/dL   Creatinine, Ser 0.57 0.44 - 1.00 mg/dL   Calcium 6.7 (L) 8.9 - 10.3 mg/dL   GFR calc non Af Amer >60 >60 mL/min   GFR calc Af Amer >60 >60 mL/min    Comment: (NOTE) The eGFR has been calculated using the CKD EPI equation. This calculation has not been validated in all clinical situations. eGFR's persistently <60 mL/min signify possible Chronic Kidney Disease.    Anion gap 8 5 - 15  Magnesium     Status: None   Collection Time: 11/01/16  5:38 AM  Result Value Ref Range   Magnesium 2.1 1.7 - 2.4 mg/dL  Phosphorus     Status: Abnormal   Collection Time: 11/01/16  5:38 AM  Result Value Ref Range   Phosphorus 1.6 (L) 2.5 - 4.6 mg/dL    ECG   N/A  Telemetry   NSR at 75- Personally Reviewed  Radiology    Dg Chest Port 1 View  Result Date: 10/30/2016 CLINICAL DATA:  Hypoxia. EXAM: PORTABLE CHEST 1 VIEW COMPARISON:  10/28/2016 FINDINGS: There is no opacity at both lung bases obscuring hemidiaphragms consistent with moderate bilateral effusions. Associated basilar opacity in the lungs is likely atelectasis. Pneumonia is not  excluded. No evidence of pulmonary edema. Heart is mildly enlarged.  No mediastinal or hilar masses. No pneumothorax. IMPRESSION: 1. New bilateral moderate size pleural effusions with associated lung base opacity, the latter likely atelectasis. No evidence of pulmonary edema. Electronically Signed   By: Lajean Manes M.D.   On: 10/30/2016 08:41   Dg Abd Portable 1v  Result Date: 10/30/2016 CLINICAL DATA:  Abdominal tenderness.  History of a cholecystectomy. EXAM: PORTABLE ABDOMEN - 1 VIEW COMPARISON:  CT, 10/28/2016 FINDINGS: There is no bowel dilation to suggest obstruction. Mild increased stool is noted in the colon. There has been a prior cholecystectomy. Dense aortic and iliac vascular calcifications are noted. No evidence of a renal or ureteral stone. Soft tissues are otherwise unremarkable. There changes from the ORIF of a proximal right femur fracture. No acute skeletal abnormality. IMPRESSION: 1. No acute findings.  No evidence of bowel obstruction. Electronically Signed   By: Lajean Manes M.D.   On: 10/30/2016 08:43    Cardiac Studies   Echo  Indications:      Atrial fibrillation - 427.31.  ------------------------------------------------------------------- History:   PMH:  Sepsis.  Atrial fibrillation.  ------------------------------------------------------------------- Study Conclusions  - Left ventricle: The cavity size was normal. Wall thickness was   increased in a pattern of moderate LVH. Systolic function was   vigorous. The estimated ejection fraction was in the range of 65%   to 70%. Wall motion was normal; there were no regional wall   motion abnormalities. The study is not technically sufficient to   allow evaluation of LV diastolic function. -  Aortic valve: Mildly to moderately calcified annulus. Trileaflet. - Mitral valve: Calcified annulus. There was mild to moderate   regurgitation. - Right ventricle: The cavity size was mildly dilated. - Right atrium: The  atrium was moderately dilated. - Tricuspid valve: There was moderate regurgitation. - Pulmonary arteries: PA peak pressure: 31 mm Hg (S). - Pericardium, extracardiac: There was no pericardial effusion.   There was a right pleural effusion. There was a left pleural   effusion.  Impressions:  - Moderate LVH with LVEF 65-70%. Indeterminate diastolic function.   Mildly calcified mitral annulus with mild to moderate mitral   regurgitation. Calcified aortic annulus. Mildly dilated right   ventricle. Moderately dilated right atrium. Moderate tricuspid   regurgitation with PASP estimated 31 mmHg. Evidence of bilateral   pleural effusions.  Assessment   Principal Problem:   Acute metabolic encephalopathy Active Problems:   Severe sepsis ([place])   Acute cystitis with hematuria   Atrial fibrillation with RVR (HCC)   Rhabdomyolysis   Acute diastolic (congestive) heart failure ([place])   Plan   1. On Eliquis continue beta blocker and amiodarone will arrange outpatient /fu with Dr Debara Pickett 2. Encephalopathy:  Slowly improving  3. Sepsis :  Improving still on iv Zosyn  Will sign off   [employer]

## 2016-11-01 NOTE — Clinical Social Work Note (Signed)
Clinical Social Work Assessment  Patient Details  Name: Haley Tucker MRN: [id number] Date of Birth: 09-Aug-1933  Date of referral:  11/01/16               Reason for consult:  Facility Placement                Permission sought to share information with:  Facility Medical sales representative, Family Supports Permission granted to share information::  Yes, Verbal Permission Granted  Name::     Haley Tucker::  SNFs  Relationship::  [employer] Information:  [phone]  Housing/Transportation Living arrangements for the past 2 months:  Single Family Home Source of Information:  Other (Comment Required) (Nephew) Patient Interpreter Needed:  None Criminal Activity/Legal Involvement Pertinent to Current Situation/Hospitalization:  No - Comment as needed Significant Relationships:  Other Family Members Lives with:  Self Do you feel safe going back to the place where you live?  No Need for family participation in patient care:  Yes (Comment)  Care giving concerns:  CSW received consult for possible SNF placement at time of discharge. CSW spoke with patient's nephew regarding PT recommendation of SNF placement at time of discharge. He reports that patient does not have any other family nearby and he lives in [place]. Patient's nephew reported that patient lives alone and given patient's current physical needs and fall risk, she will need SNF. Patient's nephew expressed understanding of PT recommendation and is agreeable to SNF placement at time of discharge. He reports that she has been to [facility] and [facility] before. He does not have access to her Medicare card. CSW to continue to follow and assist with discharge planning needs.   Social Worker assessment / plan:  CSW spoke with patient's nephew concerning possibility of rehab at [facility] before returning home. CSW contacted [facility]. CSW could not find SSN number on patient's chart. Patient's SSN:  [id number] and has [facility] Medicare as of earlier this year per [facility].  Employment status:  Retired Health and safety inspector:  Teacher, early years/pre) PT Recommendations:  Skilled Nursing Facility Information / Referral to community resources:  Skilled Nursing Facility  Patient/Family's Response to care:  Patient's nephew recognizes need for rehab before returning home and is agreeable to a SNF in [place]. He feel that patient may not like going to a SNF again (she went earlier this year) but that she may need to look at staying long term.  Patient/Family's Understanding of and Emotional Response to Diagnosis, Current Treatment, and Prognosis:  Patient/family is realistic regarding therapy needs and expressed being hopeful for SNF placement. Patient's nephew expressed understanding of CSW role and discharge process as well as her medical condition. No questions/concerns about plan or treatment.    Emotional Assessment Appearance:  Appears stated age Attitude/Demeanor/Rapport:  Unable to Assess Affect (typically observed):  Unable to Assess Orientation:  Oriented to Self, Oriented to Place Alcohol / Substance use:  Not Applicable Psych involvement (Current and /or in the community):  No (Comment)  Discharge Needs  Concerns to be addressed:  Care Coordination Readmission within the last 30 days:  No Current discharge risk:  Lives alone Barriers to Discharge:  Continued Medical Work up   [employer], LCSWA 11/01/2016, 5:29 PM

## 2016-11-01 NOTE — Evaluation (Signed)
Occupational Therapy Evaluation Patient Details Name: Haley Tucker MRN: [id number] DOB: December 18, 1933 Today's Date: 11/01/2016    History of Present Illness Pt is a 83 y/o female who was admitted from home with acute metabolic encephalopathy, acute hypoxic respiratory failure, and sepsis secondary to UTI. Pt found at home by EMS in soiled bed and with confusion. Chest x-ray revealed pleural effusion and abdominal imaging negative for acute abnormality. PMH includes UTI, R TKA, thrush, and CHF.     Clinical Impression   Pt was living alone, reports using a RW and driving, but also stating she was living in an ALF initially. Pt presents with impaired cognition and generalized weakness with poor sitting and standing balance. She requires min to total assist with ADL. Pt will need rehab in SNF upon discharge.     Follow Up Recommendations  SNF;Supervision/Assistance - 24 hour    Equipment Recommendations       Recommendations for Other Services       Precautions / Restrictions Precautions Precautions: Fall Restrictions Weight Bearing Restrictions: No      Mobility Bed Mobility Overal bed mobility: Needs Assistance Bed Mobility: Rolling;Sidelying to Sit;Sit to Supine Rolling: Mod assist Sidelying to sit: Max assist   Sit to supine: Max assist   General bed mobility comments: pt requiring verbal cues to sequence, assist to raise trunk and advance hips to EOB, assist with all aspects of returning to supine  Transfers Overall transfer level: Needs assistance Equipment used: None Transfers: Sit to/from Stand Sit to Stand: Max assist         General transfer comment: max assist from elevated bed    Balance Overall balance assessment: Needs assistance Sitting-balance support: Bilateral upper extremity supported;Feet supported Sitting balance-Leahy Scale: Poor Sitting balance - Comments: requiring moderate to maximum assistance Postural control: Posterior lean;Right lateral  lean Standing balance support: Bilateral upper extremity supported Standing balance-Leahy Scale: Poor Standing balance comment: heavily reliant on external support to stand briefly                           ADL either performed or assessed with clinical judgement   ADL Overall ADL's : Needs assistance/impaired Eating/Feeding: Sitting;Minimal assistance   Grooming: Wash/dry hands;Wash/dry face;Minimal assistance;Bed level   Upper Body Bathing: Maximal assistance;Bed level   Lower Body Bathing: Total assistance;Bed level   Upper Body Dressing : Moderate assistance;Bed level   Lower Body Dressing: Total assistance;Bed level                 General ADL Comments: Pt unable to sit EOB without maximum support in order to participate in any ADL.     Vision Baseline Vision/History: Wears glasses Wears Glasses: At all times Patient Visual Report: No change from baseline       Perception     Praxis      Pertinent Vitals/Pain Pain Assessment: No/denies pain     Hand Dominance Right   Extremity/Trunk Assessment Upper Extremity Assessment Upper Extremity Assessment: Generalized weakness   Lower Extremity Assessment Lower Extremity Assessment: Defer to PT evaluation   Cervical / Trunk Assessment Cervical / Trunk Assessment: Kyphotic   Communication Communication Communication: No difficulties   Cognition Arousal/Alertness: Awake/alert Behavior During Therapy: Flat affect Overall Cognitive Status: Impaired/Different from baseline Area of Impairment: Orientation;Memory;Problem solving;Safety/judgement                 Orientation Level: Disoriented to;Place;Time (pt aware she is here with a UTI  and knows shes in the hospit)   Memory: Decreased short-term memory   Safety/Judgement: Decreased awareness of safety;Decreased awareness of deficits   Problem Solving: Slow processing;Decreased initiation;Difficulty sequencing;Requires verbal  cues General Comments: pt with difficulty following conversation   General Comments  No family present during session. Will need to follow up to ensure correct information given.     Exercises     Shoulder Instructions      Home Living Family/patient expects to be discharged to:: Private residence Living Arrangements: Alone   Type of Home: Apartment Home Access: Level entry     Home Layout: One level     Bathroom Shower/Tub: Chief Strategy Officer: Handicapped height     Home Equipment: Environmental consultant - 2 wheels;Cane - quad;Shower seat   Additional Comments: Limited history given cognitive impairments. Will need to follow up with family to ensure correct information. Pt initially reporting that she lives in an ALF.       Prior Functioning/Environment Level of Independence: Independent with assistive device(s)        Comments: reports driving and getting her own groceries        OT Problem List: Decreased strength;Impaired balance (sitting and/or standing);Decreased cognition;Decreased coordination;Decreased safety awareness;Decreased knowledge of use of DME or AE      OT Treatment/Interventions: Self-care/ADL training;DME and/or AE instruction;Therapeutic activities;Cognitive remediation/compensation;Patient/family education;Balance training    OT Goals(Current goals can be found in the care plan section) Acute Rehab OT Goals Patient Stated Goal: get stronger OT Goal Formulation: With patient Time For Goal Achievement: 11/15/16 Potential to Achieve Goals: Good ADL Goals Pt Will Perform Grooming: with mod assist;standing Pt Will Perform Upper Body Dressing: with min assist;sitting Pt Will Perform Lower Body Dressing: with mod assist;sit to/from stand Pt Will Transfer to Toilet: with mod assist;ambulating;bedside commode Pt Will Perform Toileting - Clothing Manipulation and hygiene: with mod assist;sit to/from stand Additional ADL Goal #1: Pt will sit EOB  with supervision while engaged in ADL at EOB.  OT Frequency: Min 2X/week   Barriers to D/C: Decreased caregiver support          Co-evaluation              AM-PAC PT "6 Clicks" Daily Activity     Outcome Measure Help from another person eating meals?: A Little Help from another person taking care of personal grooming?: A Lot Help from another person toileting, which includes using toliet, bedpan, or urinal?: Total Help from another person bathing (including washing, rinsing, drying)?: A Lot Help from another person to put on and taking off regular upper body clothing?: A Lot Help from another person to put on and taking off regular lower body clothing?: Total 6 Click Score: 11   End of Session Equipment Utilized During Treatment: Gait belt  Activity Tolerance: Patient limited by fatigue Patient left: in bed;with call bell/phone within reach;with bed alarm set  OT Visit Diagnosis: Unsteadiness on feet (R26.81);Muscle weakness (generalized) (M62.81);Other symptoms and signs involving cognitive function;History of falling (Z91.81)                Time: 1531-1550 OT Time Calculation (min): 19 min Charges:  OT General Charges $OT Visit: 1 Procedure OT Evaluation $OT Eval Moderate Complexity: 1 Procedure G-Codes:     Evern Bio 11/01/2016, 4:08 PM  [phone]

## 2016-11-01 NOTE — Evaluation (Signed)
Physical Therapy Evaluation Patient Details Name: Haley Tucker MRN: [id number] DOB: 20-Feb-1934 Today's Date: 11/01/2016   History of Present Illness  Pt is a 83 y/o female who was admitted from home with acute metabolic encephalopathy, acute hypoxic respiratory failure, and sepsis secondary to UTI. Pt found at home by EMS in soiled bed and with confusion. Chest x-ray revealed pleural effusion and abdominal imaging negative for acute abnormality. PMH includes UTI, R TKA, thrush, and CHF.    Clinical Impression  Pt admitted secondary to problem above with deficits below. PTA, pt reports she was using RW to get around, however, before admission was found by EMS laying in a soiled bed and confused. Upon evaluation, pt with cognitive deficits below, decreased strength, decreased balance, and decreased activity tolerance. Pt required mod to heavy max A for mobility tasks. Demonstrated poor sitting balance and required continuous support to maintain. Pt reports she has no one to assist her at home and lives alone. Pt is unsafe to return home, therefore recommending SNF at d/c to increase independence with functional mobility. Will continue to follow acutely to maximize functional mobility independence.     Follow Up Recommendations SNF    Equipment Recommendations  None recommended by PT    Recommendations for Other Services OT consult     Precautions / Restrictions Precautions Precautions: Fall Restrictions Weight Bearing Restrictions: No      Mobility  Bed Mobility Overal bed mobility: Needs Assistance Bed Mobility: Rolling;Sidelying to Sit;Sit to Supine Rolling: Mod assist Sidelying to sit: Max assist   Sit to supine: Max assist   General bed mobility comments: Pt required clean up after bowel movement. Mod A for rolling side to side. Heavy max A required for sit<>supine. Verbal cues for sequencing required.   Transfers Overall transfer level: Needs assistance Equipment used:  None Transfers: Sit to/from Stand Sit to Stand: Max assist         General transfer comment: Stood X 2. Pt with very limited tolerance for standing and reporting some dizziness. VSS. Max A to stand with PT assist. Verbal cues for hand placement  Ambulation/Gait             General Gait Details: NT  Stairs            Wheelchair Mobility    Modified Rankin (Stroke Patients Only)       Balance Overall balance assessment: Needs assistance Sitting-balance support: Bilateral upper extremity supported;Feet supported Sitting balance-Leahy Scale: Poor Sitting balance - Comments: Posterior and R lateral lean in sitting. Verbal and manual cues for upright. Required from min to max A to maintain sitting balance.  Postural control: Posterior lean;Right lateral lean Standing balance support: Bilateral upper extremity supported Standing balance-Leahy Scale: Poor Standing balance comment: Reliant on PT assist and BUE for support in static standing.                              Pertinent Vitals/Pain Pain Assessment: No/denies pain    Home Living Family/patient expects to be discharged to:: Private residence Living Arrangements: Alone   Type of Home: Apartment Home Access: Level entry     Home Layout: One level Home Equipment: Walker - 2 wheels;Cane - quad;Shower seat Additional Comments: Limited history given cognitive impairments. Will need to follow up with family to ensure correct information.     Prior Function Level of Independence: Independent with assistive device(s)  Comments: Used walker, however, was found in the bed very confused and was soiled     Hand Dominance   Dominant Hand: Right    Extremity/Trunk Assessment   Upper Extremity Assessment Upper Extremity Assessment: Generalized weakness (Sensory in tact)    Lower Extremity Assessment Lower Extremity Assessment: Generalized weakness (sensory in tact; grossly 3-/5  throughout )    Cervical / Trunk Assessment Cervical / Trunk Assessment: Kyphotic  Communication   Communication: No difficulties  Cognition Arousal/Alertness: Awake/alert Behavior During Therapy: Flat affect Overall Cognitive Status: No family/caregiver present to determine baseline cognitive functioning                                 General Comments: Pt only oriented to person and place. Decreased safety awareness, along with slowed processing throughout session. Unaware than she had had a bowel movement.       General Comments General comments (skin integrity, edema, etc.): No family present during session. Will need to follow up to ensure correct information given.     Exercises     Assessment/Plan    PT Assessment Patient needs continued PT services  PT Problem List Decreased strength;Decreased activity tolerance;Decreased balance;Decreased mobility;Decreased cognition;Decreased knowledge of use of DME;Decreased safety awareness;Decreased knowledge of precautions       PT Treatment Interventions DME instruction;Gait training;Functional mobility training;Therapeutic activities;Therapeutic exercise;Neuromuscular re-education;Balance training;Patient/family education;Cognitive remediation    PT Goals (Current goals can be found in the Care Plan section)  Acute Rehab PT Goals Patient Stated Goal: None stated  PT Goal Formulation: Patient unable to participate in goal setting Time For Goal Achievement: 11/15/16 Potential to Achieve Goals: Fair    Frequency Min 2X/week   Barriers to discharge Decreased caregiver support Has no family who could assist at home.     Co-evaluation               AM-PAC PT "6 Clicks" Daily Activity  Outcome Measure Difficulty turning over in bed (including adjusting bedclothes, sheets and blankets)?: Total Difficulty moving from lying on back to sitting on the side of the bed? : Total Difficulty sitting down on and  standing up from a chair with arms (e.g., wheelchair, bedside commode, etc,.)?: Total Help needed moving to and from a bed to chair (including a wheelchair)?: Total Help needed walking in hospital room?: Total Help needed climbing 3-5 steps with a railing? : Total 6 Click Score: 6    End of Session Equipment Utilized During Treatment: Gait belt Activity Tolerance: Patient tolerated treatment well Patient left: in bed;with call bell/phone within reach;with bed alarm set Nurse Communication: Mobility status PT Visit Diagnosis: Muscle weakness (generalized) (M62.81);Other abnormalities of gait and mobility (R26.89);Other symptoms and signs involving the nervous system (R29.898)    Time: 1610-9604 PT Time Calculation (min) (ACUTE ONLY): 20 min   Charges:   PT Evaluation $PT Eval Moderate Complexity: 1 Procedure     PT G Codes:        Gladys Damme, PT, DPT  Acute Rehabilitation Services  Pager: [phone]   Lehman Prom 11/01/2016, 1:45 PM

## 2016-11-02 ENCOUNTER — Inpatient Hospital Stay (HOSPITAL_COMMUNITY): Payer: Medicare Other

## 2016-11-02 LAB — CBC
HCT: 37.4 % (ref 36.0–46.0)
Hemoglobin: 12.7 g/dL (ref 12.0–15.0)
MCH: 29.5 pg (ref 26.0–34.0)
MCHC: 34 g/dL (ref 30.0–36.0)
MCV: 87 fL (ref 78.0–100.0)
PLATELETS: 342 10*3/uL (ref 150–400)
RBC: 4.3 MIL/uL (ref 3.87–5.11)
RDW: 17.4 % — ABNORMAL HIGH (ref 11.5–15.5)
WBC: 9.1 10*3/uL (ref 4.0–10.5)

## 2016-11-02 LAB — CULTURE, BLOOD (ROUTINE X 2)
CULTURE: NO GROWTH
CULTURE: NO GROWTH
SPECIAL REQUESTS: ADEQUATE
Special Requests: ADEQUATE

## 2016-11-02 LAB — COMPREHENSIVE METABOLIC PANEL
ALBUMIN: 2.4 g/dL — AB (ref 3.5–5.0)
ALT: 18 U/L (ref 14–54)
AST: 24 U/L (ref 15–41)
Alkaline Phosphatase: 66 U/L (ref 38–126)
Anion gap: 11 (ref 5–15)
BUN: 14 mg/dL (ref 6–20)
CHLORIDE: 107 mmol/L (ref 101–111)
CO2: 17 mmol/L — AB (ref 22–32)
Calcium: 7.1 mg/dL — ABNORMAL LOW (ref 8.9–10.3)
Creatinine, Ser: 0.58 mg/dL (ref 0.44–1.00)
GFR calc Af Amer: >60 mL/min (ref >60)
GLUCOSE: 98 mg/dL (ref 65–99)
Potassium: 3.5 mmol/L (ref 3.5–5.1)
Sodium: 135 mmol/L (ref 135–145)
Total Bilirubin: 1.2 mg/dL (ref 0.3–1.2)
Total Protein: 5.6 g/dL — ABNORMAL LOW (ref 6.5–8.1)

## 2016-11-02 MED ORDER — AMIODARONE HCL 200 MG PO TABS
200.0000 mg | ORAL_TABLET | Freq: Two times a day (BID) | ORAL | Status: DC
  Administered 2016-11-02 – 2016-11-04 (×4): 200 mg via ORAL
  Filled 2016-11-02 (×4): qty 1

## 2016-11-02 MED ORDER — SODIUM CHLORIDE 0.9 % IV BOLUS (SEPSIS)
250.0000 mL | Freq: Once | INTRAVENOUS | Status: AC
  Administered 2016-11-02: 250 mL via INTRAVENOUS

## 2016-11-02 MED ORDER — AMIODARONE IV BOLUS ONLY 150 MG/100ML
150.0000 mg | Freq: Once | INTRAVENOUS | Status: AC
  Administered 2016-11-02: 150 mg via INTRAVENOUS
  Filled 2016-11-02 (×2): qty 100

## 2016-11-02 MED ORDER — METOPROLOL TARTRATE 25 MG PO TABS
25.0000 mg | ORAL_TABLET | Freq: Three times a day (TID) | ORAL | Status: DC
  Administered 2016-11-02 – 2016-11-04 (×6): 25 mg via ORAL
  Filled 2016-11-02 (×6): qty 1

## 2016-11-02 MED ORDER — METOPROLOL TARTRATE 5 MG/5ML IV SOLN
5.0000 mg | Freq: Once | INTRAVENOUS | Status: DC
Start: 2016-11-02 — End: 2016-11-02

## 2016-11-02 NOTE — Progress Notes (Signed)
Progress Note  Patient Name: Haley Tucker Date of Encounter: 11/02/2016  Primary Cardiologist: Dr. Rennis Golden  Subjective   Fatigue and tired. "wore out".   Inpatient Medications    Scheduled Meds: . amiodarone  200 mg Oral BID  . amoxicillin  500 mg Oral Q8H  . apixaban  5 mg Oral BID  . clotrimazole  10 mg Oral 5 X Daily  . famotidine  10 mg Oral BID  . mouth rinse  15 mL Mouth Rinse BID  . metoprolol tartrate  25 mg Oral Q8H  . phosphorus  500 mg Oral TID WC & HS  . predniSONE  5 mg Oral Q breakfast  . saccharomyces boulardii  250 mg Oral BID   Continuous Infusions: . sodium chloride 250 mL (10/28/16 2338)  . amiodarone     PRN Meds: sodium chloride, metoprolol tartrate   Vital Signs    Vitals:   11/01/16 2135 11/02/16 0501 11/02/16 0840 11/02/16 1220  BP: 128/61 99/73 107/62 112/69  Pulse: 84 74 (!) 109   Resp: 18 18    Temp: 97.5 F (36.4 C) 99.3 F (37.4 C)    TempSrc:      SpO2: 97% 95%    Weight:  139 lb 3.2 oz (63.1 kg)    Height:        Intake/Output Summary (Last 24 hours) at 11/02/16 1304 Last data filed at 11/02/16 1000  Gross per 24 hour  Intake           776.67 ml  Output              400 ml  Net           376.67 ml   Filed Weights   10/31/16 0500 11/01/16 0619 11/02/16 0501  Weight: 138 lb (62.6 kg) 138 lb 14.2 oz (63 kg) 139 lb 3.2 oz (63.1 kg)    Telemetry    Currently in sinus rhythm. Overnight went into afib rvr  - Personally Reviewed  ECG    None today   Physical Exam   GEN: Thin frail ill appearing female in no acute distress.   Neck: No JVD Cardiac: RRR, no murmurs, rubs, or gallops.  Respiratory: Clear to auscultation bilaterally. GI: Soft, nontender, non-distended  MS: No edema; No deformity. Neuro:  Nonfocal  Psych: Normal affect   Labs    Chemistry Recent Labs Lab 10/30/16 1208 10/31/16 0817 10/31/16 1541 11/01/16 0538 11/02/16 0834  NA 136 130* 131* 131* 135  K 3.3* 2.4* 3.5 3.6 3.5  CL 110 100* 101  103 107  CO2 16* 21* 21* 20* 17*  GLUCOSE 99 99 123* 83 98  BUN 21* 12 14 12 14   CREATININE 0.67 0.62 0.64 0.57 0.58  CALCIUM 6.8* 6.8* 6.6* 6.7* 7.1*  PROT 5.3* 5.6*  --   --  5.6*  ALBUMIN 2.2* 2.2*  --   --  2.4*  AST 33 23  --   --  24  ALT 21 19  --   --  18  ALKPHOS 71 72  --   --  66  BILITOT 1.3* 1.2  --   --  1.2  GFRNONAA >60 >60 >60 >60 >60  GFRAA >60 >60 >60 >60 >60  ANIONGAP 10 9 9 8 11      Hematology Recent Labs Lab 10/31/16 0303 11/01/16 0538 11/02/16 0358  WBC 7.9 6.6 9.1  RBC 3.74* 4.32 4.30  HGB 11.3* 12.8 12.7  HCT 32.4* 37.7 37.4  MCV 86.6 87.3 87.0  MCH 30.2 29.6 29.5  MCHC 34.9 34.0 34.0  RDW 17.4* 17.5* 17.4*  PLT 209 236 342    Cardiac Enzymes Recent Labs Lab 10/28/16 2320 10/29/16 0705 10/29/16 1226 10/30/16 0441  TROPONINI 0.12* 0.07*  0.13* 0.14* 0.13*    Recent Labs Lab 10/28/16 1708 10/28/16 1938  TROPIPOC 0.06 0.10*       Radiology    Ct Head Wo Contrast  Result Date: 11/02/2016 CLINICAL DATA:  83 year old female with history of acute confusion. Multiple episodes of atrial fibrillation today. EXAM: CT HEAD WITHOUT CONTRAST TECHNIQUE: Contiguous axial images were obtained from the base of the skull through the vertex without intravenous contrast. COMPARISON:  Head CT 10/20/2016. FINDINGS: Brain: Mild cerebral atrophy. Patchy and confluent areas of decreased attenuation are noted throughout the deep and periventricular white matter of the cerebral hemispheres bilaterally, compatible with chronic microvascular ischemic disease. Chronic physiologic calcifications in the basal ganglia bilaterally. No evidence of acute infarction, hemorrhage, hydrocephalus, extra-axial collection or mass lesion/mass effect. Vascular: No hyperdense vessel or unexpected calcification. Skull: Normal. Negative for fracture or focal lesion. Sinuses/Orbits: Chronic mucoperiosteal thickening, opacification and involution of the right maxillary sinus, similar to  the prior study. Small left mastoid effusion unchanged. Other: None. IMPRESSION: 1. No acute intracranial abnormalities. 2. Mild cerebral atrophy with chronic microvascular ischemic changes in the cerebral white matter redemonstrated, as above. 3. Small left mastoid effusion is unchanged. Electronically Signed   By: Trudie Reed M.D.   On: 11/02/2016 12:48    Cardiac Studies   Echo 09/29/16 Study Conclusions  - Left ventricle: The cavity size was normal. Wall thickness was increased in a pattern of moderate LVH. Systolic function was vigorous. The estimated ejection fraction was in the range of 65% to 70%. Wall motion was normal; there were no regional wall motion abnormalities. The study is not technically sufficient to allow evaluation of LV diastolic function. - Aortic valve: Mildly to moderately calcified annulus. Trileaflet. - Mitral valve: Calcified annulus. There was mild to moderate regurgitation. - Right ventricle: The cavity size was mildly dilated. - Right atrium: The atrium was moderately dilated. - Tricuspid valve: There was moderate regurgitation. - Pulmonary arteries: PA peak pressure: 31 mm Hg (S). - Pericardium, extracardiac: There was no pericardial effusion. There was a right pleural effusion. There was a left pleural effusion.  Impressions:  - Moderate LVH with LVEF 65-70%. Indeterminate diastolic function. Mildly calcified mitral annulus with mild to moderate mitral regurgitation. Calcified aortic annulus. Mildly dilated right ventricle. Moderately dilated right atrium. Moderate tricuspid regurgitation with PASP estimated 31 mmHg. Evidence of bilateral pleural effusions.  Patient Profile     83 y.o. female with hx of PAF, RA, HTN, recurrent UTI who brought by EMS for AMS and found to have sepsis 2nd to UTI and rhabdomyolysis. Cardiology is consulted for afib RVR.   Assessment & Plan    1. PAF - intermittently going in and  out of afib since admission. Overnight went into afib RVR. She converted to sinus rhythm shortly after noon.  - Increased metoprolol to 25mg  TID and amiodarone to 200mg  BID as recommended by Dr. Eden Emms. Continue Eliquis for anticoagulation.   2. Acute diastolic CHF - volume status stable.   3. Mild to moderate MR - Willl need yearly echo.   Signed, Manson Passey, PA  11/02/2016, 1:04 PM    Patient examined chart reviewed. Discussed with Dr Butler Denmark earlier. PAF now resolved converted. Still with some Confusion Lungs clear mild  MR murmur at apex. Increased lopressor to tid and increased amiodarone to bid  Charlton Haws

## 2016-11-02 NOTE — Progress Notes (Addendum)
PROGRESS NOTE    Haley Tucker   TUU:[id number]  DOB: 02/24/1934  DOA: 10/28/2016 PCP: No primary care provider on file.   Brief Narrative:  83 y/o female with a h/o Rheumatoid arthritis, frequent UTIs,   HTN who lives alone was brought in by EMS for confusion. She was found laying in feces and urine. Her POA (nephew [name]) had not heard from her in 2 days and called the town hall who called first responders.  In the ER she was noted to be alert but disoriented. BP 90/60, HR in 110s, Temp 101.3 with a UA consistent with UTI, warm and erythematous right knee and tender abdomen.  -CT abd/pelvis was normal except for a large stool burden.  - She has had a right knee replacement and xrays reveal hardware to be stable and no abnormalities.  Lactic acid 3.93 Procalcitonin 5.34 CK total 743 WBV 17.2 with left shift BUN 36, Cr 1.23, sodium 134 Has mildly elevated troponins CT head normal - noted to have A-fib with RVR on admission- given aggressive fluid boluses and converted to NSR Noted to be hypoxic but had normal CXR Admitted for sepsis thought to be due to a UTI.   Subjective: Alert but not aware she is in the hospital. No pain, nausea, cough, shortness of breath. No palpitations. No other complaints.   Assessment & Plan:   Active Problems:   Severe sepsis (HCC) due to UTI - h/o frequent UTIs - vitals and labs as mentioned above consistent with severe sepsis -  lactic acid has normalized -  She has been receiving Vanc and Zosyn- Gr neg rods on U cultures- d/c'd Vanc - final cultures results show E coli sensitive to Ampicillin to which she has been switched on 7/2 - blood cultures continue to be NGTD - initial concern with right knee redness and swelling- was not a septic joint- right  knee erythema and tenderness has resolved- likely was injured from a fall from possibly laying on it for a prolonged period-  - abdomen was tender on admission as well- no signs of diverticulitis or  colitis on CT   Acute toxic encephalopathy-  - thought to be due to UTI/ Sepsis  - noted to have improved yesterday but she is confused to place today-  Was in A-fib with RVR overnight and had some documented hypotension (99/73)-  she has had no other issues- no fevers- no sedatives or pain meds given - CT head negative today - may be developing delirium in relation to hospital stay? - follow  Metabolic acidosis - has been persistent since admission - lactic acid had normalized on 6/29 - Bicarb 20 >> 17 today - ? Poor perfusion in relation to A-fib with RVR overnight - follow  A-fib with RVR- has been paroxysmal - tells me that she has not history of this- I spoken with her POA [name] who is not aware if she has a history of this in the past  - converted to NSR In ER after fluid boluses but went back in to A-fib with RVR  . Was started on a Cardizem infusion which was titrate as high as 17 mg/hr (per night nurse) and failed to control HR. IV Metoprolol was ineffective as well-  Subsequently transitioned to an Amiodarone infusion -  Cardiology consulted: switched to oral Amio - already on Metoprolol (was on this at home)- continues to flip back and forth into A-fib  - ECHO noted below shows probable diastolic CHF  -  thyroid functions WNL - CHA2DS2-VASc Score at least 4 -  heparin infusion started in the ER- switched to Eliquis for now while in hospital especially as she is frequently going back and forth into A-fib - PT eval completed- she is quite unsteady and probably should not be discharged to SNF with a NOAC - 7/3- she continued to have A-fib with RVR with HR as high as 150s through out last night - this converted to NSR while she was receiving NS bolus of 250 cc, I have given a bolus of Amio 150 mg and increased Amio and Metoprolol today-- cardiology following-  follow for further episodes  Thrush - Fluconazole will interact with Amiodarone - cont Mycelex Troch  LVH with  indeterminate diastolic function, Mo TR  - PASP 31 - follow for heart failure   Acute hypoxic resp failure- acute on chronic diastolic CHF - ? If initial hypoxia was due to sepsis and poor perfusion -  ungs were clear on exam- no respiratory distress- CXR clear on admission - 6/29- after aggressive hydration, CXR shows b/l effusions/ pulmonary edema - IVF stopped and lasix given- respiratory status stable, following   Hypokalemia, Hypophosphatemia, hyponatremia - treating- follow  Abdominal tenderness and constipation - disimpaction done - apparently she had 1 large BM  but xray was still showing excess stool burden  - Enema given on 6/30- no results - 7/2 - Mag Citrate given - had 2 large BM  AKI - resolved with IVF- cont to follow  Hyponatremia - - likely is due to HCTZ which is on hold  Elevated Troponin - mild elevation noted  - suspect cardiac strain due to sepsis and A-fib - ECHO noted below shows no WMA  Rhabdomyolysis - resolved  Hypophosphatemia - replaced  Elevated T bili - follow- no abnormality noted on CT abd/pelvis- possibly due to sepsis- has resolved  Hypoalbuminemia - due to sepsis/ infection and possibly poor diet at home?  Rheumatoid arthritis - hold Methotrexate - cont Prednisone 5 mg daily   DVT prophylaxis: Heparin infusion>> Eliquis Code Status: DNR Family Communication: Adair Patter Disposition Plan: SNF - monitor for A-fib with RVR overnight Consultants:   cardiology Procedures:  2 D ECHO Moderate LVH with LVEF 65-70%. Indeterminate diastolic function.   Mildly calcified mitral annulus with mild to moderate mitral   regurgitation. Calcified aortic annulus. Mildly dilated right   ventricle. Moderately dilated right atrium. Moderate tricuspid   regurgitation with PASP estimated 31 mmHg. Evidence of bilateral   pleural effusions.  Antimicrobials:  Anti-infectives    Start     Dose/Rate Route Frequency Ordered Stop   11/01/16  1400  amoxicillin (AMOXIL) capsule 500 mg     500 mg Oral Every 8 hours 11/01/16 1030     11/01/16 1100  fluconazole (DIFLUCAN) tablet 100 mg  Status:  Discontinued     100 mg Oral Daily 11/01/16 1030 11/01/16 1046   11/01/16 1015  amoxicillin (AMOXIL) capsule 500 mg  Status:  Discontinued     500 mg Oral 4 times daily 11/01/16 1009 11/01/16 1030   10/30/16 2000  piperacillin-tazobactam (ZOSYN) IVPB 3.375 g  Status:  Discontinued     3.375 g 12.5 mL/hr over 240 Minutes Intravenous Every 8 hours 10/29/16 1706 10/30/16 1223   10/30/16 1400  piperacillin-tazobactam (ZOSYN) IVPB 3.375 g  Status:  Discontinued     3.375 g 12.5 mL/hr over 240 Minutes Intravenous Every 8 hours 10/30/16 1223 11/01/16 1014   10/30/16 1400  vancomycin (  VANCOCIN) 500 mg in sodium chloride 0.9 % 100 mL IVPB  Status:  Discontinued     500 mg 100 mL/hr over 60 Minutes Intravenous Every 12 hours 10/30/16 1225 10/31/16 1203   10/29/16 1700  vancomycin (VANCOCIN) 500 mg in sodium chloride 0.9 % 100 mL IVPB  Status:  Discontinued     500 mg 100 mL/hr over 60 Minutes Intravenous Every 24 hours 10/28/16 1810 10/30/16 1225   10/28/16 2200  piperacillin-tazobactam (ZOSYN) IVPB 3.375 g  Status:  Discontinued     3.375 g 12.5 mL/hr over 240 Minutes Intravenous Every 8 hours 10/28/16 1810 10/29/16 1706   10/28/16 1700  piperacillin-tazobactam (ZOSYN) IVPB 3.375 g     3.375 g 100 mL/hr over 30 Minutes Intravenous  Once 10/28/16 1650 10/28/16 1750   10/28/16 1700  vancomycin (VANCOCIN) IVPB 1000 mg/200 mL premix     1,000 mg 200 mL/hr over 60 Minutes Intravenous  Once 10/28/16 1650 10/28/16 1853       Objective: Vitals:   11/02/16 1220 11/02/16 1311 11/02/16 1345 11/02/16 1352  BP: 112/69 120/63 125/69 105/89  Pulse:  81 72 69  Resp:      Temp:   98.3 F (36.8 C)   TempSrc:   Oral   SpO2:    97%  Weight:      Height:        Intake/Output Summary (Last 24 hours) at 11/02/16 1642 Last data filed at 11/02/16 1300   Gross per 24 hour  Intake              700 ml  Output              400 ml  Net              300 ml   Filed Weights   10/31/16 0500 11/01/16 0619 11/02/16 0501  Weight: 62.6 kg (138 lb) 63 kg (138 lb 14.2 oz) 63.1 kg (139 lb 3.2 oz)    Examination: General exam: Appears comfortable - alert today HEENT: PERRLA, oral mucosa dry, no sclera icterus or thrush Respiratory system: Clear to auscultation. Respiratory effort normal. Cardiovascular system: S1 & S2 heard, RRR.  No murmurs  Gastrointestinal system: Abdomen soft,  Non-tender abdomen today nondistended. Normal bowel sound. No organomegaly Central nervous system:   No focal neurological deficits.- oriented to person and place and partly to situation Extremities: No cyanosis, clubbing or edema Skin:  rythema of right knee rrsolved completely - knee non-tender, mild erythema of right wrist- non- tender Psychiatry:  Mood & affect appropriate.     Data Reviewed: I have personally reviewed following labs and imaging studies  CBC:  Recent Labs Lab 10/28/16 1655  10/30/16 0441 10/30/16 0926 10/31/16 0303 11/01/16 0538 11/02/16 0358  WBC 17.2*  < > 10.1 8.9 7.9 6.6 9.1  NEUTROABS 14.3*  --   --   --   --   --   --   HGB 14.6  < > 12.1 12.4 11.3* 12.8 12.7  HCT 43.8  < > 36.4 37.3 32.4* 37.7 37.4  MCV 90.9  < > 89.0 89.4 86.6 87.3 87.0  PLT 218  < > 203 194 209 236 342  < > = values in this interval not displayed. Basic Metabolic Panel:  Recent Labs Lab 10/28/16 2048  10/29/16 0705  10/30/16 0441  10/30/16 1208 10/31/16 0817 10/31/16 1541 11/01/16 0538 11/02/16 0834  NA  --   --  134*  < >  --   < >  136 130* 131* 131* 135  K  --   --  3.6  < >  --   < > 3.3* 2.4* 3.5 3.6 3.5  CL  --   --  105  < >  --   < > 110 100* 101 103 107  CO2  --   --  22  < >  --   < > 16* 21* 21* 20* 17*  GLUCOSE  --   --  123*  < >  --   < > 99 99 123* 83 98  BUN  --   --  24*  < >  --   < > 21* 12 14 12 14   CREATININE  --   --  0.88  < >   --   < > 0.67 0.62 0.64 0.57 0.58  CALCIUM  --   --  7.2*  < >  --   < > 6.8* 6.8* 6.6* 6.7* 7.1*  MG 1.7  --  2.0  --   --   --   --  1.8  --  2.1  --   PHOS  --   < > 2.0*  --  1.5*  --  1.5* 1.2*  --  1.6*  --   < > = values in this interval not displayed. GFR: Estimated Creatinine Clearance: 49 mL/min (by C-G formula based on SCr of 0.58 mg/dL). Liver Function Tests:  Recent Labs Lab 10/29/16 0705 10/29/16 1355 10/30/16 1208 10/31/16 0817 11/02/16 0834  AST 33 35 33 23 24  ALT 27 26 21 19 18   ALKPHOS 64 75 71 72 66  BILITOT 1.6* 1.5* 1.3* 1.2 1.2  PROT 5.3* 5.9* 5.3* 5.6* 5.6*  ALBUMIN 2.3* 2.5* 2.2* 2.2* 2.4*    Recent Labs Lab 10/28/16 1655  LIPASE 27   No results for input(s): AMMONIA in the last 168 hours. Coagulation Profile:  Recent Labs Lab 10/28/16 1655  INR 1.73   Cardiac Enzymes:  Recent Labs Lab 10/28/16 1655 10/28/16 2320 10/29/16 0705 10/29/16 1226 10/30/16 0441  CKTOTAL 743*  --   --   --  264*  TROPONINI  --  0.12* 0.07*  0.13* 0.14* 0.13*   BNP (last 3 results) No results for input(s): PROBNP in the last 8760 hours. HbA1C: No results for input(s): HGBA1C in the last 72 hours. CBG: No results for input(s): GLUCAP in the last 168 hours. Lipid Profile: No results for input(s): CHOL, HDL, LDLCALC, TRIG, CHOLHDL, LDLDIRECT in the last 72 hours. Thyroid Function Tests: No results for input(s): TSH, T4TOTAL, FREET4, T3FREE, THYROIDAB in the last 72 hours. Anemia Panel: No results for input(s): VITAMINB12, FOLATE, FERRITIN, TIBC, IRON, RETICCTPCT in the last 72 hours. Urine analysis:    Component Value Date/Time   COLORURINE AMBER (A) 10/28/2016 1804   APPEARANCEUR CLOUDY (A) 10/28/2016 1804   LABSPEC 1.017 10/28/2016 1804   PHURINE 5.0 10/28/2016 1804   GLUCOSEU NEGATIVE 10/28/2016 1804   HGBUR MODERATE (A) 10/28/2016 1804   BILIRUBINUR NEGATIVE 10/28/2016 1804   KETONESUR 80 (A) 10/28/2016 1804   PROTEINUR 30 (A) 10/28/2016 1804     NITRITE POSITIVE (A) 10/28/2016 1804   LEUKOCYTESUR LARGE (A) 10/28/2016 1804   Sepsis Labs: @LABRCNTIP (procalcitonin:4,lacticidven:4) ) Recent Results (from the past 240 hour(s))  Culture, blood (Routine x 2)     Status: None   Collection Time: 10/28/16  4:55 PM  Result Value Ref Range Status   Specimen Description BLOOD LEFT ANTECUBITAL  Final  Special Requests   Final    BOTTLES DRAWN AEROBIC AND ANAEROBIC Blood Culture adequate volume   Culture NO GROWTH 5 DAYS  Final   Report Status 11/02/2016 FINAL  Final  Culture, blood (Routine x 2)     Status: None   Collection Time: 10/28/16  5:00 PM  Result Value Ref Range Status   Specimen Description BLOOD RIGHT ANTECUBITAL  Final   Special Requests   Final    BOTTLES DRAWN AEROBIC AND ANAEROBIC Blood Culture adequate volume   Culture NO GROWTH 5 DAYS  Final   Report Status 11/02/2016 FINAL  Final  Urine culture     Status: Abnormal   Collection Time: 10/28/16  6:04 PM  Result Value Ref Range Status   Specimen Description URINE, CATHETERIZED  Final   Special Requests NONE  Final   Culture >=100,000 COLONIES/mL ESCHERICHIA COLI (A)  Final   Report Status 11/01/2016 FINAL  Final   Organism ID, Bacteria ESCHERICHIA COLI (A)  Final      Susceptibility   Escherichia coli - MIC*    AMPICILLIN <=2 SENSITIVE Sensitive     CEFAZOLIN <=4 SENSITIVE Sensitive     CEFTRIAXONE <=1 SENSITIVE Sensitive     CIPROFLOXACIN <=0.25 SENSITIVE Sensitive     GENTAMICIN <=1 SENSITIVE Sensitive     IMIPENEM <=0.25 SENSITIVE Sensitive     NITROFURANTOIN <=16 SENSITIVE Sensitive     TRIMETH/SULFA <=20 SENSITIVE Sensitive     AMPICILLIN/SULBACTAM <=2 SENSITIVE Sensitive     PIP/TAZO <=4 SENSITIVE Sensitive     Extended ESBL NEGATIVE Sensitive     * >=100,000 COLONIES/mL ESCHERICHIA COLI  MRSA PCR Screening     Status: Abnormal   Collection Time: 10/28/16 10:57 PM  Result Value Ref Range Status   MRSA by PCR INVALID RESULTS, SPECIMEN SENT FOR  CULTURE (A) NEGATIVE Final    Comment: J.WOODY,RN AT 0330 BY L.PITT 10/29/16        The GeneXpert MRSA Assay (FDA approved for NASAL specimens only), is one component of a comprehensive MRSA colonization surveillance program. It is not intended to diagnose MRSA infection nor to guide or monitor treatment for MRSA infections.   MRSA culture     Status: None   Collection Time: 10/28/16 10:57 PM  Result Value Ref Range Status   Specimen Description NASAL SWAB  Final   Special Requests NONE  Final   Culture NO MRSA DETECTED  Final   Report Status 10/30/2016 FINAL  Final  MRSA PCR Screening     Status: None   Collection Time: 10/30/16  6:43 PM  Result Value Ref Range Status   MRSA by PCR NEGATIVE NEGATIVE Final    Comment:        The GeneXpert MRSA Assay (FDA approved for NASAL specimens only), is one component of a comprehensive MRSA colonization surveillance program. It is not intended to diagnose MRSA infection nor to guide or monitor treatment for MRSA infections.          Radiology Studies: Ct Head Wo Contrast  Result Date: 11/02/2016 CLINICAL DATA:  83 year old female with history of acute confusion. Multiple episodes of atrial fibrillation today. EXAM: CT HEAD WITHOUT CONTRAST TECHNIQUE: Contiguous axial images were obtained from the base of the skull through the vertex without intravenous contrast. COMPARISON:  Head CT 10/20/2016. FINDINGS: Brain: Mild cerebral atrophy. Patchy and confluent areas of decreased attenuation are noted throughout the deep and periventricular white matter of the cerebral hemispheres bilaterally, compatible with chronic microvascular ischemic disease.  Chronic physiologic calcifications in the basal ganglia bilaterally. No evidence of acute infarction, hemorrhage, hydrocephalus, extra-axial collection or mass lesion/mass effect. Vascular: No hyperdense vessel or unexpected calcification. Skull: Normal. Negative for fracture or focal lesion.  Sinuses/Orbits: Chronic mucoperiosteal thickening, opacification and involution of the right maxillary sinus, similar to the prior study. Small left mastoid effusion unchanged. Other: None. IMPRESSION: 1. No acute intracranial abnormalities. 2. Mild cerebral atrophy with chronic microvascular ischemic changes in the cerebral white matter redemonstrated, as above. 3. Small left mastoid effusion is unchanged. Electronically Signed   By: Trudie Reed M.D.   On: 11/02/2016 12:48      Scheduled Meds: . amiodarone  200 mg Oral BID  . amoxicillin  500 mg Oral Q8H  . apixaban  5 mg Oral BID  . clotrimazole  10 mg Oral 5 X Daily  . famotidine  10 mg Oral BID  . mouth rinse  15 mL Mouth Rinse BID  . metoprolol tartrate  25 mg Oral Q8H  . phosphorus  500 mg Oral TID WC & HS  . predniSONE  5 mg Oral Q breakfast  . saccharomyces boulardii  250 mg Oral BID   Continuous Infusions: . sodium chloride 250 mL (10/28/16 2338)     LOS: 5 days    Time spent in minutes: 35    Calvert Cantor, MD [facility] Pager: [email or web address] Password [facility] 11/02/2016, 4:42 PM

## 2016-11-02 NOTE — Progress Notes (Addendum)
[facility]  Evaluated patient just now. Noted to be in A-fib with RVR. Spoke with central telemetry. She went back into A-fib last night and has been in A-fib through the night.  Patient currently is confused again. BP 110/60, pulse ox 96%, RR in 20s.  Will give 150 mg of Amio IV bolus and 250 cc of IVF.  CT head STAT. I have spoken with Dr Eden Emms. Advised by him to increase Metoprolol from 25 mg BID to TID and increase Amio from 200 mg daily to BID.    Calvert Cantor, MD

## 2016-11-03 LAB — BASIC METABOLIC PANEL
Anion gap: 11 (ref 5–15)
BUN: 14 mg/dL (ref 6–20)
CALCIUM: 6.6 mg/dL — AB (ref 8.9–10.3)
CO2: 18 mmol/L — AB (ref 22–32)
CREATININE: 0.55 mg/dL (ref 0.44–1.00)
Chloride: 108 mmol/L (ref 101–111)
GFR calc Af Amer: >60 mL/min (ref >60)
GLUCOSE: 92 mg/dL (ref 65–99)
Potassium: 4.3 mmol/L (ref 3.5–5.1)
Sodium: 137 mmol/L (ref 135–145)

## 2016-11-03 NOTE — Progress Notes (Signed)
Patient ID: Haley Tucker, female   DOB: January 25, 1934, 83 y.o.   MRN: [id number]                                                                PROGRESS NOTE                                                                                                                                                                                                             Patient Demographics:    Haley Tucker, is a 83 y.o. female, DOB - [birth year], WUJ:[id number]  Admit date - 10/28/2016   Admitting Physician Oretha Milch, MD  Outpatient Primary MD for the patient is No primary care provider on file.  LOS - 6  Outpatient Specialists:   Dr. Rennis Golden  Chief Complaint  Patient presents with  . Altered Mental Status       Brief Narrative   83 y/o female with a h/o Rheumatoid arthritis, frequent UTIs,   HTN who lives alone was brought in by EMS for confusion. She was found laying in feces and urine. Her POA (nephew [name]) had not heard from her in 2 days and called the town hall who called first responders.  In the ER she was noted to be alert but disoriented. BP 90/60, HR in 110s, Temp 101.3 with a UA consistent with UTI, warm and erythematous right knee and tender abdomen.  -CT abd/pelvis was normal except for a large stool burden.  - She has had a right knee replacement and xrays reveal hardware to be stable and no abnormalities.  Lactic acid 3.93 Procalcitonin 5.34 CK total 743 WBV 17.2 with left shift BUN 36, Cr 1.23, sodium 134 Has mildly elevated troponins CT head normal - noted to have A-fib with RVR on admission- given aggressive fluid boluses and converted to NSR Noted to be hypoxic but had normal CXR Admitted for sepsis thought to be due to a UTI.    Subjective:    Haley Tucker today appears  More alert, able to follow commands,  aoxo2 (person, place)  ,pt has converted to SR.   No headache, No chest pain, No abdominal pain - No Nausea, No new weakness tingling or numbness, No Cough - SOB.      Assessment  & Plan :  Principal Problem:   Acute metabolic encephalopathy Active Problems:   Severe sepsis (HCC)   Acute cystitis with hematuria   Atrial fibrillation with RVR (HCC)   Rhabdomyolysis   Acute diastolic (congestive) heart failure (HCC)   Thrush     Severe sepsis (HCC) due to UTI - h/o frequent UTIs - vitals and labs as mentioned above consistent with severe sepsis -  lactic acid has normalized -  She has been receiving Vanc and Zosyn- Gr neg rods on U cultures- d/c'd Vanc - final cultures results show E coli sensitive to Ampicillin to which she has been switched on 7/2 - blood cultures continue to be NGTD - initial concern with right knee redness and swelling- was not a septic joint- right  knee erythema and tenderness has resolved- likely was injured from a fall from possibly laying on it for a prolonged period-  - abdomen was tender on admission as well- no signs of diverticulitis or colitis on CT   Acute toxic encephalopathy-  - thought to be due to UTI/ Sepsis  - noted to have improved yesterday but she is confused to place today-  Was in A-fib with RVR overnight and had some documented hypotension (99/73)-  she has had no other issues- no fevers- no sedatives or pain meds given - CT head negative today - may be developing delirium in relation to hospital stay? - follow Pt appears to have improved,  I left message for nephrew on his Mobile that she was improving and that we will try to get her to SNF if she continues to progress  Metabolic acidosis - has been persistent since admission - lactic acid had normalized on 6/29 - Bicarb 20 >> 17 >>>18  - ? Poor perfusion in relation to A-fib with RVR overnight - follow  A-fib with RVR- has been paroxysmal - tells me that she has not history of this- I spoken with her POA [name] who is not aware if she has a history of this in the past  - converted to NSR In ER after fluid boluses but went back in to A-fib  with RVR  . Was started on a Cardizem infusion which was titrate as high as 17 mg/hr (per night nurse) and failed to control HR. IV Metoprolol was ineffective as well-  Subsequently transitioned to an Amiodarone infusion -  Cardiology consulted: switched to oral Amio - already on Metoprolol (was on this at home)- continues to flip back and forth into A-fib  - ECHO noted below shows probable diastolic CHF  -  thyroid functions WNL - CHA2DS2-VASc Score at least 4 -  heparin infusion started in the ER- switched to Eliquis for now while in hospital especially as she is frequently going back and forth into A-fib - PT eval completed- she is quite unsteady and probably should not be discharged to SNF with a NOAC - 7/3- she continued to have A-fib with RVR with HR as high as 150s through out last night - this converted to NSR while she was receiving NS bolus of 250 cc, I have given a bolus of Amio 150 mg and increased Amio and Metoprolol today-- cardiology following-  follow for further episodes  Thrush - Fluconazole will interact with Amiodarone - cont Mycelex Troch  LVH with indeterminate diastolic function, Mo TR  - PASP 31 - follow for heart failure   Acute hypoxic resp failure- acute on chronic diastolic CHF - ? If initial hypoxia was due to sepsis and  poor perfusion -  ungs were clear on exam- no respiratory distress- CXR clear on admission - 6/29- after aggressive hydration, CXR shows b/l effusions/ pulmonary edema - IVF stopped and lasix given- respiratory status stable, following   Hypokalemia, Hypophosphatemia, hyponatremia - treating- follow  Abdominal tenderness and constipation - disimpaction done - apparently she had 1 large BM  but xray was still showing excess stool burden  - Enema given on 6/30- no results - 7/2 - Mag Citrate given - had 2 large BM  AKI - resolved with IVF- cont to follow  Hyponatremia - - likely is due to HCTZ which is on hold  Elevated  Troponin - mild elevation noted  - suspect cardiac strain due to sepsis and A-fib - ECHO noted below shows no WMA  Rhabdomyolysis - resolved  Hypophosphatemia - replaced  Elevated T bili - follow- no abnormality noted on CT abd/pelvis- possibly due to sepsis- has resolved  Hypoalbuminemia - due to sepsis/ infection and possibly poor diet at home?  Rheumatoid arthritis - hold Methotrexate - cont Prednisone 5 mg daily   DVT prophylaxis: Heparin infusion>> Eliquis Code Status: DNR Family Communication: Adair Patter Disposition Plan: SNF - monitor for A-fib with RVR overnight Consultants:   cardiology Procedures:  2 D ECHO Moderate LVH with LVEF 65-70%. Indeterminate diastolic function. Mildly calcified mitral annulus with mild to moderate mitral regurgitation. Calcified aortic annulus. Mildly dilated right ventricle. Moderately dilated right atrium. Moderate tricuspid regurgitation with PASP estimated 31 mmHg. Evidence of bilateral pleural effusions.    Lab Results  Component Value Date   PLT 342 11/02/2016     Anti-infectives    Start     Dose/Rate Route Frequency Ordered Stop   11/01/16 1400  amoxicillin (AMOXIL) capsule 500 mg     500 mg Oral Every 8 hours 11/01/16 1030     11/01/16 1100  fluconazole (DIFLUCAN) tablet 100 mg  Status:  Discontinued     100 mg Oral Daily 11/01/16 1030 11/01/16 1046   11/01/16 1015  amoxicillin (AMOXIL) capsule 500 mg  Status:  Discontinued     500 mg Oral 4 times daily 11/01/16 1009 11/01/16 1030   10/30/16 2000  piperacillin-tazobactam (ZOSYN) IVPB 3.375 g  Status:  Discontinued     3.375 g 12.5 mL/hr over 240 Minutes Intravenous Every 8 hours 10/29/16 1706 10/30/16 1223   10/30/16 1400  piperacillin-tazobactam (ZOSYN) IVPB 3.375 g  Status:  Discontinued     3.375 g 12.5 mL/hr over 240 Minutes Intravenous Every 8 hours 10/30/16 1223 11/01/16 1014   10/30/16 1400  vancomycin (VANCOCIN) 500 mg in sodium  chloride 0.9 % 100 mL IVPB  Status:  Discontinued     500 mg 100 mL/hr over 60 Minutes Intravenous Every 12 hours 10/30/16 1225 10/31/16 1203   10/29/16 1700  vancomycin (VANCOCIN) 500 mg in sodium chloride 0.9 % 100 mL IVPB  Status:  Discontinued     500 mg 100 mL/hr over 60 Minutes Intravenous Every 24 hours 10/28/16 1810 10/30/16 1225   10/28/16 2200  piperacillin-tazobactam (ZOSYN) IVPB 3.375 g  Status:  Discontinued     3.375 g 12.5 mL/hr over 240 Minutes Intravenous Every 8 hours 10/28/16 1810 10/29/16 1706   10/28/16 1700  piperacillin-tazobactam (ZOSYN) IVPB 3.375 g     3.375 g 100 mL/hr over 30 Minutes Intravenous  Once 10/28/16 1650 10/28/16 1750   10/28/16 1700  vancomycin (VANCOCIN) IVPB 1000 mg/200 mL premix     1,000 mg 200 mL/hr  over 60 Minutes Intravenous  Once 10/28/16 1650 10/28/16 1853        Objective:   Vitals:   11/02/16 1345 11/02/16 1352 11/02/16 2109 11/03/16 0522  BP: 125/69 105/89 126/60 137/71  Pulse: 72 69 76 74  Resp:   18   Temp: 98.3 F (36.8 C)   98.9 F (37.2 C)  TempSrc: Oral   Oral  SpO2:  97% 97% 95%  Weight:    63.2 kg (139 lb 5.3 oz)  Height:        Wt Readings from Last 3 Encounters:  11/03/16 63.2 kg (139 lb 5.3 oz)     Intake/Output Summary (Last 24 hours) at 11/03/16 1202 Last data filed at 11/02/16 1300  Gross per 24 hour  Intake              120 ml  Output                0 ml  Net              120 ml     Physical Exam  Awake Alert, Oriented X 3, No new F.N deficits, Normal affect [place].AT,PERRAL Supple Neck,No JVD, No cervical lymphadenopathy appriciated.  Symmetrical Chest wall movement, Good air movement bilaterally, CTAB RRR, S1, S2 2/6 sem apex,  No Parasternal Heave +ve B.Sounds, Abd Soft, No tenderness, No organomegaly appriciated, No rebound - guarding or rigidity. No Cyanosis, Clubbing or edema, No new Rash or bruise      Data Review:    CBC  Recent Labs Lab 10/28/16 1655  10/30/16 0441 10/30/16 0926  10/31/16 0303 11/01/16 0538 11/02/16 0358  WBC 17.2*  < > 10.1 8.9 7.9 6.6 9.1  HGB 14.6  < > 12.1 12.4 11.3* 12.8 12.7  HCT 43.8  < > 36.4 37.3 32.4* 37.7 37.4  PLT 218  < > 203 194 209 236 342  MCV 90.9  < > 89.0 89.4 86.6 87.3 87.0  MCH 30.3  < > 29.6 29.7 30.2 29.6 29.5  MCHC 33.3  < > 33.2 33.2 34.9 34.0 34.0  RDW 18.3*  < > 17.8* 17.8* 17.4* 17.5* 17.4*  LYMPHSABS 1.0  --   --   --   --   --   --   MONOABS 1.9*  --   --   --   --   --   --   EOSABS 0.0  --   --   --   --   --   --   BASOSABS 0.0  --   --   --   --   --   --   < > = values in this interval not displayed.  Chemistries   Recent Labs Lab 10/28/16 2048 10/29/16 0705  10/29/16 1355  10/30/16 1208 10/31/16 0817 10/31/16 1541 11/01/16 0538 11/02/16 0834 11/03/16 0537  NA  --  134*  < > 135  < > 136 130* 131* 131* 135 137  K  --  3.6  < > 3.7  < > 3.3* 2.4* 3.5 3.6 3.5 4.3  CL  --  105  < > 108  < > 110 100* 101 103 107 108  CO2  --  22  < > 17*  < > 16* 21* 21* 20* 17* 18*  GLUCOSE  --  123*  < > 89  < > 99 99 123* 83 98 92  BUN  --  24*  < > 24*  < >  21* 12 14 12 14 14   CREATININE  --  0.88  < > 0.80  < > 0.67 0.62 0.64 0.57 0.58 0.55  CALCIUM  --  7.2*  < > 7.6*  < > 6.8* 6.8* 6.6* 6.7* 7.1* 6.6*  MG 1.7 2.0  --   --   --   --  1.8  --  2.1  --   --   AST  --  33  --  35  --  33 23  --   --  24  --   ALT  --  27  --  26  --  21 19  --   --  18  --   ALKPHOS  --  64  --  75  --  71 72  --   --  66  --   BILITOT  --  1.6*  --  1.5*  --  1.3* 1.2  --   --  1.2  --   < > = values in this interval not displayed. ------------------------------------------------------------------------------------------------------------------ No results for input(s): CHOL, HDL, LDLCALC, TRIG, CHOLHDL, LDLDIRECT in the last 72 hours.  No results found for: HGBA1C ------------------------------------------------------------------------------------------------------------------ No results for input(s): TSH, T4TOTAL, T3FREE,  THYROIDAB in the last 72 hours.  Invalid input(s): FREET3 ------------------------------------------------------------------------------------------------------------------ No results for input(s): VITAMINB12, FOLATE, FERRITIN, TIBC, IRON, RETICCTPCT in the last 72 hours.  Coagulation profile  Recent Labs Lab 10/28/16 1655  INR 1.73    No results for input(s): DDIMER in the last 72 hours.  Cardiac Enzymes  Recent Labs Lab 10/29/16 0705 10/29/16 1226 10/30/16 0441  TROPONINI 0.07*  0.13* 0.14* 0.13*   ------------------------------------------------------------------------------------------------------------------ No results found for: BNP  Inpatient Medications  Scheduled Meds: . amiodarone  200 mg Oral BID  . amoxicillin  500 mg Oral Q8H  . apixaban  5 mg Oral BID  . clotrimazole  10 mg Oral 5 X Daily  . famotidine  10 mg Oral BID  . mouth rinse  15 mL Mouth Rinse BID  . metoprolol tartrate  25 mg Oral Q8H  . phosphorus  500 mg Oral TID WC & HS  . predniSONE  5 mg Oral Q breakfast  . saccharomyces boulardii  250 mg Oral BID   Continuous Infusions: . sodium chloride 250 mL (10/28/16 2338)   PRN Meds:.sodium chloride, metoprolol tartrate  Micro Results Recent Results (from the past 240 hour(s))  Culture, blood (Routine x 2)     Status: None   Collection Time: 10/28/16  4:55 PM  Result Value Ref Range Status   Specimen Description BLOOD LEFT ANTECUBITAL  Final   Special Requests   Final    BOTTLES DRAWN AEROBIC AND ANAEROBIC Blood Culture adequate volume   Culture NO GROWTH 5 DAYS  Final   Report Status 11/02/2016 FINAL  Final  Culture, blood (Routine x 2)     Status: None   Collection Time: 10/28/16  5:00 PM  Result Value Ref Range Status   Specimen Description BLOOD RIGHT ANTECUBITAL  Final   Special Requests   Final    BOTTLES DRAWN AEROBIC AND ANAEROBIC Blood Culture adequate volume   Culture NO GROWTH 5 DAYS  Final   Report Status 11/02/2016 FINAL   Final  Urine culture     Status: Abnormal   Collection Time: 10/28/16  6:04 PM  Result Value Ref Range Status   Specimen Description URINE, CATHETERIZED  Final   Special Requests NONE  Final   Culture >=100,000 COLONIES/mL  ESCHERICHIA COLI (A)  Final   Report Status 11/01/2016 FINAL  Final   Organism ID, Bacteria ESCHERICHIA COLI (A)  Final      Susceptibility   Escherichia coli - MIC*    AMPICILLIN <=2 SENSITIVE Sensitive     CEFAZOLIN <=4 SENSITIVE Sensitive     CEFTRIAXONE <=1 SENSITIVE Sensitive     CIPROFLOXACIN <=0.25 SENSITIVE Sensitive     GENTAMICIN <=1 SENSITIVE Sensitive     IMIPENEM <=0.25 SENSITIVE Sensitive     NITROFURANTOIN <=16 SENSITIVE Sensitive     TRIMETH/SULFA <=20 SENSITIVE Sensitive     AMPICILLIN/SULBACTAM <=2 SENSITIVE Sensitive     PIP/TAZO <=4 SENSITIVE Sensitive     Extended ESBL NEGATIVE Sensitive     * >=100,000 COLONIES/mL ESCHERICHIA COLI  MRSA PCR Screening     Status: Abnormal   Collection Time: 10/28/16 10:57 PM  Result Value Ref Range Status   MRSA by PCR INVALID RESULTS, SPECIMEN SENT FOR CULTURE (A) NEGATIVE Final    Comment: J.WOODY,RN AT 0330 BY L.PITT 10/29/16        The GeneXpert MRSA Assay (FDA approved for NASAL specimens only), is one component of a comprehensive MRSA colonization surveillance program. It is not intended to diagnose MRSA infection nor to guide or monitor treatment for MRSA infections.   MRSA culture     Status: None   Collection Time: 10/28/16 10:57 PM  Result Value Ref Range Status   Specimen Description NASAL SWAB  Final   Special Requests NONE  Final   Culture NO MRSA DETECTED  Final   Report Status 10/30/2016 FINAL  Final  MRSA PCR Screening     Status: None   Collection Time: 10/30/16  6:43 PM  Result Value Ref Range Status   MRSA by PCR NEGATIVE NEGATIVE Final    Comment:        The GeneXpert MRSA Assay (FDA approved for NASAL specimens only), is one component of a comprehensive MRSA  colonization surveillance program. It is not intended to diagnose MRSA infection nor to guide or monitor treatment for MRSA infections.     Radiology Reports Dg Chest 2 View  Result Date: 10/28/2016 CLINICAL DATA:  Possible sepsis. EXAM: CHEST  2 VIEW COMPARISON:  None. FINDINGS: The lungs are clear without focal pneumonia, edema, pneumothorax or pleural effusion. Cardiopericardial silhouette is at upper limits of normal for size. The visualized bony structures of the thorax are intact. Telemetry leads overlie the chest. IMPRESSION: No active cardiopulmonary disease. Electronically Signed   By: Kennith Center M.D.   On: 10/28/2016 17:42   Dg Knee 2 Views Right  Result Date: 10/28/2016 CLINICAL DATA:  83 year old female with possible sepsis. Right knee swelling and warmth. EXAM: RIGHT KNEE - 1-2 VIEW COMPARISON:  [facility] right femur series 12/30/15 and earlier FINDINGS: Partially visible intramedullary rod in the distal right femur with distal interlocking cortical screw. This hardware appears stable since 2017. No loosening. Superimposed right total knee arthroplasty. The total-knee hardware also appears stable and intact. No definite right knee joint effusion. Osteopenia. No cortical osteolysis identified about the right knee. No acute fracture. No subcutaneous gas. IMPRESSION: Stable radiographic appearance of the distal right femur and right total knee orthopedic hardware since 2017. No acute osseous abnormality identified. Electronically Signed   By: Odessa Fleming M.D.   On: 10/28/2016 17:57   Ct Head Wo Contrast  Result Date: 11/02/2016 CLINICAL DATA:  83 year old female with history of acute confusion. Multiple episodes of atrial fibrillation today. EXAM:  CT HEAD WITHOUT CONTRAST TECHNIQUE: Contiguous axial images were obtained from the base of the skull through the vertex without intravenous contrast. COMPARISON:  Head CT 10/20/2016. FINDINGS: Brain: Mild cerebral atrophy. Patchy and  confluent areas of decreased attenuation are noted throughout the deep and periventricular white matter of the cerebral hemispheres bilaterally, compatible with chronic microvascular ischemic disease. Chronic physiologic calcifications in the basal ganglia bilaterally. No evidence of acute infarction, hemorrhage, hydrocephalus, extra-axial collection or mass lesion/mass effect. Vascular: No hyperdense vessel or unexpected calcification. Skull: Normal. Negative for fracture or focal lesion. Sinuses/Orbits: Chronic mucoperiosteal thickening, opacification and involution of the right maxillary sinus, similar to the prior study. Small left mastoid effusion unchanged. Other: None. IMPRESSION: 1. No acute intracranial abnormalities. 2. Mild cerebral atrophy with chronic microvascular ischemic changes in the cerebral white matter redemonstrated, as above. 3. Small left mastoid effusion is unchanged. Electronically Signed   By: Trudie Reed M.D.   On: 11/02/2016 12:48   Ct Head Wo Contrast  Result Date: 10/28/2016 CLINICAL DATA:  83 year old female with altered mental status. EXAM: CT HEAD WITHOUT CONTRAST TECHNIQUE: Contiguous axial images were obtained from the base of the skull through the vertex without intravenous contrast. COMPARISON:  01/22/2016 and earlier. FINDINGS: Brain: Stable cerebral volume. No midline shift, ventriculomegaly, mass effect, evidence of mass lesion, intracranial hemorrhage or evidence of cortically based acute infarction. Patchy bilateral white matter hypodensity is stable since 2017. No cortical encephalomalacia. Vascular: Calcified atherosclerosis at the skull base. No suspicious intracranial vascular hyperdensity. Skull: Stable.  No acute osseous abnormality identified. Sinuses/Orbits: Stable.  Chronic right maxillary opacification. Other: Stable orbit and scalp soft tissues. IMPRESSION: No acute intracranial abnormality. Stable non contrast CT appearance of the brain since 2017.  Electronically Signed   By: Odessa Fleming M.D.   On: 10/28/2016 18:00   Ct Abdomen Pelvis W Contrast  Result Date: 10/28/2016 CLINICAL DATA:  Diffuse abdominal pain with fever EXAM: CT ABDOMEN AND PELVIS WITH CONTRAST TECHNIQUE: Multidetector CT imaging of the abdomen and pelvis was performed using the standard protocol following bolus administration of intravenous contrast. CONTRAST:  ISOVUE-300 IOPAMIDOL (ISOVUE-300) INJECTION 61% COMPARISON:  05/31/2016 FINDINGS: Lower chest: Lung bases demonstrate patchy dependent atelectasis. No pleural effusion or consolidation. Coronary artery calcification. Trace pericardial effusion or thickening. Hepatobiliary: No focal liver abnormality is seen. Status post cholecystectomy. No biliary dilatation. Pancreas: Unremarkable. No pancreatic ductal dilatation or surrounding inflammatory changes. Spleen: Normal in size without focal abnormality. Adrenals/Urinary Tract: Adrenal glands are within normal limits. Subcentimeter hypodense lesions within the kidneys, too small to further characterize. Bladder unremarkable. Stomach/Bowel: Stomach is nonenlarged. No dilated small bowel. Appendix not well seen but no right lower quadrant inflammation is identified. Colon diverticula without acute inflammation. Moderate to large amount of stool in the colon with mild to moderate formed stools in the rectum. Vascular/Lymphatic: Aortic atherosclerosis. No enlarged abdominal or pelvic lymph nodes. Reproductive: Status post hysterectomy. No adnexal masses. Other: No free air or free fluid Musculoskeletal: Status post right femoral rodding. Old fracture deformities of the right inferior and superior pubic rami knee. Heterogenous lucency in the sacrum unchanged. No acute osseous abnormality. Multiple old left-sided rib fractures. IMPRESSION: 1. No CT evidence for acute intra-abdominal or pelvic pathology. Moderate to large volume of stool in the colon 2. Old fracture deformities of the right  pubic rami. Multiple old left-sided rib fractures. 3. Diverticular disease of the colon but without acute inflammation 4. Subcentimeter hypodense lesions in the kidneys, too small to further characterize. Electronically  Signed   By: Jasmine Pang M.D.   On: 10/28/2016 19:22   Dg Chest Port 1 View  Result Date: 10/30/2016 CLINICAL DATA:  Hypoxia. EXAM: PORTABLE CHEST 1 VIEW COMPARISON:  10/28/2016 FINDINGS: There is no opacity at both lung bases obscuring hemidiaphragms consistent with moderate bilateral effusions. Associated basilar opacity in the lungs is likely atelectasis. Pneumonia is not excluded. No evidence of pulmonary edema. Heart is mildly enlarged.  No mediastinal or hilar masses. No pneumothorax. IMPRESSION: 1. New bilateral moderate size pleural effusions with associated lung base opacity, the latter likely atelectasis. No evidence of pulmonary edema. Electronically Signed   By: Amie Portland M.D.   On: 10/30/2016 08:41   Dg Abd Portable 1v  Result Date: 10/30/2016 CLINICAL DATA:  Abdominal tenderness.  History of a cholecystectomy. EXAM: PORTABLE ABDOMEN - 1 VIEW COMPARISON:  CT, 10/28/2016 FINDINGS: There is no bowel dilation to suggest obstruction. Mild increased stool is noted in the colon. There has been a prior cholecystectomy. Dense aortic and iliac vascular calcifications are noted. No evidence of a renal or ureteral stone. Soft tissues are otherwise unremarkable. There changes from the ORIF of a proximal right femur fracture. No acute skeletal abnormality. IMPRESSION: 1. No acute findings.  No evidence of bowel obstruction. Electronically Signed   By: Amie Portland M.D.   On: 10/30/2016 08:43    Time Spent in minutes  30   Pearson Grippe M.D on 11/03/2016 at 12:02 PM  Between 7am to 7pm - Pager - [phone]  After 7pm go to [email or web address] - password [facility]  [facility] -  Office  [phone]

## 2016-11-03 NOTE — Progress Notes (Signed)
Physical Therapy Treatment Patient Details Name: Haley Tucker MRN: [id number] DOB: 1934/03/18 Today's Date: 11/03/2016    History of Present Illness Pt is a 83 y/o female who was admitted from home with acute metabolic encephalopathy, acute hypoxic respiratory failure, and sepsis secondary to UTI. Pt found at home by EMS in soiled bed and with confusion. Chest x-ray revealed pleural effusion and abdominal imaging negative for acute abnormality. PMH includes UTI, R TKA, thrush, and CHF.      PT Comments    Patient is progressing toward mobility goals and able to ambulate this session. Pt required min/mod A overall for mobility. Pt continues to be confused at times. Continue to progress as tolerated with anticipated d/c to SNF for further skilled PT services.    Follow Up Recommendations  SNF     Equipment Recommendations  None recommended by PT    Recommendations for Other Services OT consult     Precautions / Restrictions Precautions Precautions: Fall Restrictions Weight Bearing Restrictions: No    Mobility  Bed Mobility Overal bed mobility: Needs Assistance Bed Mobility: Rolling;Sidelying to Sit;Sit to Supine Rolling: Min guard Sidelying to sit: Mod assist   Sit to supine: Min assist   General bed mobility comments: min guard to roll with use of bed rail and pt initiated task with vc; assist to elevate trunk into sitting and bring hips toward EOB with use of bed pad; assist to bring bilat LE into bed  Transfers Overall transfer level: Needs assistance Equipment used: Rolling walker (2 wheeled) Transfers: Sit to/from Stand Sit to Stand: Min assist;From elevated surface;Mod assist         General transfer comment: min A to power up into standing and mod A for balance upon stand with posterior lean initially but pt able to correct; cues for safe use of Rw to stand  Ambulation/Gait Ambulation/Gait assistance: Min assist Ambulation Distance (Feet): 45 Feet Assistive  device: Rolling walker (2 wheeled) Gait Pattern/deviations: Step-through pattern;Decreased step length - right;Decreased step length - left;Decreased dorsiflexion - right;Decreased dorsiflexion - left;Trunk flexed;Wide base of support     General Gait Details: cues for proximity of RW and posture   Stairs            Wheelchair Mobility    Modified Rankin (Stroke Patients Only)       Balance Overall balance assessment: Needs assistance   Sitting balance-Leahy Scale: Fair     Standing balance support: Bilateral upper extremity supported Standing balance-Leahy Scale: Poor Standing balance comment: reliant on RW for support                            Cognition Arousal/Alertness: Awake/alert Behavior During Therapy: Flat affect Overall Cognitive Status: Impaired/Different from baseline Area of Impairment: Orientation;Memory;Problem solving;Safety/judgement                 Orientation Level: Disoriented to;Place;Time (pt aware she is here with a UTI and knows shes in the hospit)   Memory: Decreased short-term memory   Safety/Judgement: Decreased awareness of safety;Decreased awareness of deficits   Problem Solving: Slow processing;Decreased initiation;Difficulty sequencing;Requires verbal cues        Exercises      General Comments        Pertinent Vitals/Pain Pain Assessment: No/denies pain    Home Living                      Prior Function  PT Goals (current goals can now be found in the care plan section) Progress towards PT goals: Progressing toward goals    Frequency    Min 2X/week      PT Plan Current plan remains appropriate    Co-evaluation              AM-PAC PT "6 Clicks" Daily Activity  Outcome Measure  Difficulty turning over in bed (including adjusting bedclothes, sheets and blankets)?: Total Difficulty moving from lying on back to sitting on the side of the bed? : Total Difficulty  sitting down on and standing up from a chair with arms (e.g., wheelchair, bedside commode, etc,.)?: Total Help needed moving to and from a bed to chair (including a wheelchair)?: A Lot Help needed walking in hospital room?: A Lot Help needed climbing 3-5 steps with a railing? : A Lot 6 Click Score: 9    End of Session Equipment Utilized During Treatment: Gait belt Activity Tolerance: Patient tolerated treatment well Patient left: in bed;with call bell/phone within reach;with bed alarm set Nurse Communication: Mobility status PT Visit Diagnosis: Muscle weakness (generalized) (M62.81);Other abnormalities of gait and mobility (R26.89);Other symptoms and signs involving the nervous system (R29.898)     Time: 0623-7628 PT Time Calculation (min) (ACUTE ONLY): 17 min  Charges:  $Gait Training: 8-22 mins                    G Codes:       Haley Tucker, PTA Pager: [phone]     Carolynne Edouard 11/03/2016, 4:50 PM

## 2016-11-03 NOTE — Progress Notes (Addendum)
Progress Note  Patient Name: Haley Tucker Date of Encounter: 11/03/2016  Primary Cardiologist: Dr. Rennis Golden  Subjective   Continues to feel fatigued and tired. Converted to sinus rhythm yesterday around 12:00 and has remained in sinus since that time. Continues to be confused, did not know that she was in the hospital.  Inpatient Medications    Scheduled Meds: . amiodarone  200 mg Oral BID  . amoxicillin  500 mg Oral Q8H  . apixaban  5 mg Oral BID  . clotrimazole  10 mg Oral 5 X Daily  . famotidine  10 mg Oral BID  . mouth rinse  15 mL Mouth Rinse BID  . metoprolol tartrate  25 mg Oral Q8H  . phosphorus  500 mg Oral TID WC & HS  . predniSONE  5 mg Oral Q breakfast  . saccharomyces boulardii  250 mg Oral BID   Continuous Infusions: . sodium chloride 250 mL (10/28/16 2338)   PRN Meds: sodium chloride, metoprolol tartrate   Vital Signs    Vitals:   11/02/16 1345 11/02/16 1352 11/02/16 2109 11/03/16 0522  BP: 125/69 105/89 126/60 137/71  Pulse: 72 69 76 74  Resp:   18   Temp: 98.3 F (36.8 C)   98.9 F (37.2 C)  TempSrc: Oral   Oral  SpO2:  97% 97% 95%  Weight:    139 lb 5.3 oz (63.2 kg)  Height:        Intake/Output Summary (Last 24 hours) at 11/03/16 0747 Last data filed at 11/02/16 1300  Gross per 24 hour  Intake              360 ml  Output              400 ml  Net              -40 ml   Filed Weights   11/01/16 0619 11/02/16 0501 11/03/16 0522  Weight: 138 lb 14.2 oz (63 kg) 139 lb 3.2 oz (63.1 kg) 139 lb 5.3 oz (63.2 kg)    Telemetry    Sinus rhythm since yesterday at 12:00  - Personally Reviewed  ECG    None today   Physical Exam   GEN: Frail HEENT: normal  Neck: no JVD, carotid bruits, or masses Cardiac: RRR; 1-2/6 murmur at the apex, no rubs, or gallops,no edema  Respiratory:  clear to auscultation bilaterally, normal work of breathing GI: soft, nontender, nondistended, + BS MS: no deformity or atrophy  Skin: warm and dry Neuro:   Strength and sensation are intact Psych: euthymic mood, full affect   Labs    Chemistry Recent Labs Lab 10/30/16 1208 10/31/16 0817  11/01/16 0538 11/02/16 0834 11/03/16 0537  NA 136 130*  < > 131* 135 137  K 3.3* 2.4*  < > 3.6 3.5 4.3  CL 110 100*  < > 103 107 108  CO2 16* 21*  < > 20* 17* 18*  GLUCOSE 99 99  < > 83 98 92  BUN 21* 12  < > 12 14 14   CREATININE 0.67 0.62  < > 0.57 0.58 0.55  CALCIUM 6.8* 6.8*  < > 6.7* 7.1* 6.6*  PROT 5.3* 5.6*  --   --  5.6*  --   ALBUMIN 2.2* 2.2*  --   --  2.4*  --   AST 33 23  --   --  24  --   ALT 21 19  --   --  18  --   ALKPHOS 71 72  --   --  66  --   BILITOT 1.3* 1.2  --   --  1.2  --   GFRNONAA >60 >60  < > >60 >60 >60  GFRAA >60 >60  < > >60 >60 >60  ANIONGAP 10 9  < > 8 11 11   < > = values in this interval not displayed.   Hematology  Recent Labs Lab 10/31/16 0303 11/01/16 0538 11/02/16 0358  WBC 7.9 6.6 9.1  RBC 3.74* 4.32 4.30  HGB 11.3* 12.8 12.7  HCT 32.4* 37.7 37.4  MCV 86.6 87.3 87.0  MCH 30.2 29.6 29.5  MCHC 34.9 34.0 34.0  RDW 17.4* 17.5* 17.4*  PLT 209 236 342    Cardiac Enzymes  Recent Labs Lab 10/28/16 2320 10/29/16 0705 10/29/16 1226 10/30/16 0441  TROPONINI 0.12* 0.07*  0.13* 0.14* 0.13*     Recent Labs Lab 10/28/16 1708 10/28/16 1938  TROPIPOC 0.06 0.10*       Radiology    Ct Head Wo Contrast  Result Date: 11/02/2016 CLINICAL DATA:  83 year old female with history of acute confusion. Multiple episodes of atrial fibrillation today. EXAM: CT HEAD WITHOUT CONTRAST TECHNIQUE: Contiguous axial images were obtained from the base of the skull through the vertex without intravenous contrast. COMPARISON:  Head CT 10/20/2016. FINDINGS: Brain: Mild cerebral atrophy. Patchy and confluent areas of decreased attenuation are noted throughout the deep and periventricular white matter of the cerebral hemispheres bilaterally, compatible with chronic microvascular ischemic disease. Chronic physiologic  calcifications in the basal ganglia bilaterally. No evidence of acute infarction, hemorrhage, hydrocephalus, extra-axial collection or mass lesion/mass effect. Vascular: No hyperdense vessel or unexpected calcification. Skull: Normal. Negative for fracture or focal lesion. Sinuses/Orbits: Chronic mucoperiosteal thickening, opacification and involution of the right maxillary sinus, similar to the prior study. Small left mastoid effusion unchanged. Other: None. IMPRESSION: 1. No acute intracranial abnormalities. 2. Mild cerebral atrophy with chronic microvascular ischemic changes in the cerebral white matter redemonstrated, as above. 3. Small left mastoid effusion is unchanged. Electronically Signed   By: 10/22/2016 M.D.   On: 11/02/2016 12:48    Cardiac Studies   Echo 09/29/16 Study Conclusions  - Left ventricle: The cavity size was normal. Wall thickness was increased in a pattern of moderate LVH. Systolic function was vigorous. The estimated ejection fraction was in the range of 65% to 70%. Wall motion was normal; there were no regional wall motion abnormalities. The study is not technically sufficient to allow evaluation of LV diastolic function. - Aortic valve: Mildly to moderately calcified annulus. Trileaflet. - Mitral valve: Calcified annulus. There was mild to moderate regurgitation. - Right ventricle: The cavity size was mildly dilated. - Right atrium: The atrium was moderately dilated. - Tricuspid valve: There was moderate regurgitation. - Pulmonary arteries: PA peak pressure: 31 mm Hg (S). - Pericardium, extracardiac: There was no pericardial effusion. There was a right pleural effusion. There was a left pleural effusion.  Impressions:  - Moderate LVH with LVEF 65-70%. Indeterminate diastolic function. Mildly calcified mitral annulus with mild to moderate mitral regurgitation. Calcified aortic annulus. Mildly dilated right ventricle. Moderately  dilated right atrium. Moderate tricuspid regurgitation with PASP estimated 31 mmHg. Evidence of bilateral pleural effusions.  Patient Profile     83 y.o. female with hx of PAF, RA, HTN, recurrent UTI who brought by EMS for AMS and found to have sepsis 2nd to UTI and rhabdomyolysis. Cardiology is consulted for afib RVR.  Assessment & Plan    1. PAF Has been in sinus rhythm for last 18 hours as infection has resolved. Continue Eliquis for stroke prevention.  Has remained in sinus rhythm, continue amiodarone at 200 mg BID for 3 weeks. If she goes back into AF, would potentially benefit from 400 BID for 2 weeks.  This patients CHA2DS2-VASc Score and unadjusted Ischemic Stroke Rate (% per year) is equal to 3.2 % stroke rate/year from a score of 3  Above score calculated as 1 point each if present [CHF, HTN, DM, Vascular=MI/PAD/Aortic Plaque, Age if 65-74, or Female] Above score calculated as 2 points each if present [Age > 75, or Stroke/TIA/TE]  2. Acute diastolic CHF No signs of volume overload  3. Mild to moderate MR Plan for yearly TTE.  Signed, Amyiah Gaba Jorja Loa, MD  11/03/2016, 7:47 AM

## 2016-11-04 ENCOUNTER — Encounter (HOSPITAL_COMMUNITY): Payer: Self-pay

## 2016-11-04 DIAGNOSIS — B379 Candidiasis, unspecified: Secondary | ICD-10-CM | POA: Diagnosis not present

## 2016-11-04 DIAGNOSIS — I5032 Chronic diastolic (congestive) heart failure: Secondary | ICD-10-CM | POA: Diagnosis not present

## 2016-11-04 DIAGNOSIS — R531 Weakness: Secondary | ICD-10-CM | POA: Diagnosis not present

## 2016-11-04 DIAGNOSIS — I499 Cardiac arrhythmia, unspecified: Secondary | ICD-10-CM | POA: Diagnosis not present

## 2016-11-04 DIAGNOSIS — N390 Urinary tract infection, site not specified: Secondary | ICD-10-CM | POA: Diagnosis not present

## 2016-11-04 DIAGNOSIS — K5900 Constipation, unspecified: Secondary | ICD-10-CM | POA: Diagnosis not present

## 2016-11-04 DIAGNOSIS — I10 Essential (primary) hypertension: Secondary | ICD-10-CM | POA: Diagnosis not present

## 2016-11-04 DIAGNOSIS — G9341 Metabolic encephalopathy: Secondary | ICD-10-CM | POA: Diagnosis not present

## 2016-11-04 DIAGNOSIS — M79609 Pain in unspecified limb: Secondary | ICD-10-CM | POA: Diagnosis not present

## 2016-11-04 DIAGNOSIS — I5033 Acute on chronic diastolic (congestive) heart failure: Secondary | ICD-10-CM | POA: Diagnosis not present

## 2016-11-04 DIAGNOSIS — A419 Sepsis, unspecified organism: Secondary | ICD-10-CM | POA: Diagnosis not present

## 2016-11-04 DIAGNOSIS — I480 Paroxysmal atrial fibrillation: Secondary | ICD-10-CM | POA: Diagnosis not present

## 2016-11-04 DIAGNOSIS — N3001 Acute cystitis with hematuria: Secondary | ICD-10-CM | POA: Diagnosis not present

## 2016-11-04 DIAGNOSIS — R489 Unspecified symbolic dysfunctions: Secondary | ICD-10-CM | POA: Diagnosis not present

## 2016-11-04 DIAGNOSIS — J9691 Respiratory failure, unspecified with hypoxia: Secondary | ICD-10-CM | POA: Diagnosis not present

## 2016-11-04 DIAGNOSIS — M6281 Muscle weakness (generalized): Secondary | ICD-10-CM | POA: Diagnosis not present

## 2016-11-04 DIAGNOSIS — E876 Hypokalemia: Secondary | ICD-10-CM | POA: Diagnosis not present

## 2016-11-04 DIAGNOSIS — R262 Difficulty in walking, not elsewhere classified: Secondary | ICD-10-CM | POA: Diagnosis not present

## 2016-11-04 DIAGNOSIS — M069 Rheumatoid arthritis, unspecified: Secondary | ICD-10-CM | POA: Diagnosis not present

## 2016-11-04 DIAGNOSIS — R6520 Severe sepsis without septic shock: Secondary | ICD-10-CM | POA: Diagnosis not present

## 2016-11-04 DIAGNOSIS — N3281 Overactive bladder: Secondary | ICD-10-CM | POA: Diagnosis not present

## 2016-11-04 DIAGNOSIS — I4891 Unspecified atrial fibrillation: Secondary | ICD-10-CM | POA: Diagnosis not present

## 2016-11-04 DIAGNOSIS — G9340 Encephalopathy, unspecified: Secondary | ICD-10-CM | POA: Diagnosis not present

## 2016-11-04 LAB — COMPREHENSIVE METABOLIC PANEL
ALT: 16 U/L (ref 14–54)
ANION GAP: 9 (ref 5–15)
AST: 18 U/L (ref 15–41)
Albumin: 2.4 g/dL — ABNORMAL LOW (ref 3.5–5.0)
Alkaline Phosphatase: 58 U/L (ref 38–126)
BUN: 13 mg/dL (ref 6–20)
CHLORIDE: 107 mmol/L (ref 101–111)
CO2: 23 mmol/L (ref 22–32)
CREATININE: 0.6 mg/dL (ref 0.44–1.00)
Calcium: 6.9 mg/dL — ABNORMAL LOW (ref 8.9–10.3)
Glucose, Bld: 100 mg/dL — ABNORMAL HIGH (ref 65–99)
POTASSIUM: 3.2 mmol/L — AB (ref 3.5–5.1)
SODIUM: 139 mmol/L (ref 135–145)
Total Bilirubin: 0.9 mg/dL (ref 0.3–1.2)
Total Protein: 5.3 g/dL — ABNORMAL LOW (ref 6.5–8.1)

## 2016-11-04 LAB — CBC
HCT: 35.9 % — ABNORMAL LOW (ref 36.0–46.0)
Hemoglobin: 11.7 g/dL — ABNORMAL LOW (ref 12.0–15.0)
MCH: 29.4 pg (ref 26.0–34.0)
MCHC: 32.6 g/dL (ref 30.0–36.0)
MCV: 90.2 fL (ref 78.0–100.0)
PLATELETS: 506 10*3/uL — AB (ref 150–400)
RBC: 3.98 MIL/uL (ref 3.87–5.11)
RDW: 17.8 % — ABNORMAL HIGH (ref 11.5–15.5)
WBC: 9.9 10*3/uL (ref 4.0–10.5)

## 2016-11-04 MED ORDER — SACCHAROMYCES BOULARDII 250 MG PO CAPS: 250.0000 mg | ORAL_CAPSULE | Freq: Two times a day (BID) | ORAL | 0 refills | Status: AC

## 2016-11-04 MED ORDER — CLOTRIMAZOLE 10 MG MT TROC: 10.0000 mg | Freq: Every day | OROMUCOSAL | 0 refills | Status: AC

## 2016-11-04 MED ORDER — POTASSIUM CHLORIDE CRYS ER 20 MEQ PO TBCR
20.0000 meq | EXTENDED_RELEASE_TABLET | Freq: Once | ORAL | Status: AC
  Administered 2016-11-04: 20 meq via ORAL
  Filled 2016-11-04: qty 1

## 2016-11-04 MED ORDER — AMIODARONE HCL 200 MG PO TABS: 200.0000 mg | ORAL_TABLET | Freq: Two times a day (BID) | ORAL | 0 refills | Status: AC

## 2016-11-04 MED ORDER — K PHOS MONO-SOD PHOS DI & MONO 155-852-130 MG PO TABS: 500.0000 mg | ORAL_TABLET | Freq: Three times a day (TID) | ORAL | 0 refills | Status: AC

## 2016-11-04 MED ORDER — APIXABAN 5 MG PO TABS: 5.0000 mg | ORAL_TABLET | Freq: Two times a day (BID) | ORAL | 0 refills | Status: AC

## 2016-11-04 MED ORDER — AMOXICILLIN 500 MG PO CAPS: 500.0000 mg | ORAL_CAPSULE | Freq: Three times a day (TID) | ORAL | 0 refills | Status: AC

## 2016-11-04 NOTE — Care Management Important Message (Deleted)
Important Message  Patient Details  Name: Haley Tucker MRN: [id number] Date of Birth: 05-09-1933   Medicare Important Message Given:  Yes    Maddyx Wieck Stefan Church 11/04/2016, 12:43 PM

## 2016-11-04 NOTE — Progress Notes (Signed)
Progress Note  Patient Name: Haley Tucker Date of Encounter: 11/04/2016  Primary Cardiologist: [facility]  Subjective   Converted to sinus on 11/02/2016 around 12:00 and continues to remain in sinus since that time.  She reports feeling well, no chest pains, shortness of breath or palpitations.  Inpatient Medications    Scheduled Meds: . amiodarone  200 mg Oral BID  . amoxicillin  500 mg Oral Q8H  . apixaban  5 mg Oral BID  . clotrimazole  10 mg Oral 5 X Daily  . famotidine  10 mg Oral BID  . mouth rinse  15 mL Mouth Rinse BID  . metoprolol tartrate  25 mg Oral Q8H  . phosphorus  500 mg Oral TID WC & HS  . predniSONE  5 mg Oral Q breakfast  . saccharomyces boulardii  250 mg Oral BID   Continuous Infusions: . sodium chloride 250 mL (10/28/16 2338)   PRN Meds: sodium chloride, metoprolol tartrate   Vital Signs    Vitals:   11/03/16 1604 11/03/16 2146 11/04/16 0500 11/04/16 0611  BP: (!) 123/99 (!) 132/52  (!) 148/73  Pulse: 77 83  85  Resp: 17 18  18   Temp: 98.5 F (36.9 C) 98.3 F (36.8 C)  99.5 F (37.5 C)  TempSrc:  Oral  Oral  SpO2: 99% 99%  97%  Weight:   11 lb (4.99 kg)   Height:       No intake or output data in the 24 hours ending 11/04/16 0938 Filed Weights   11/02/16 0501 11/03/16 0522 11/04/16 0500  Weight: 139 lb 3.2 oz (63.1 kg) 139 lb 5.3 oz (63.2 kg) 11 lb (4.99 kg)    Telemetry    SR, HR 70s - Personally Reviewed   Physical Exam   GEN: Frail HEENT: normal  Neck: no JVD, carotid bruits, or masses Cardiac: RRR; 1-2/6 murmur at the apex, no rubs, or gallops,no edema  Respiratory:  clear to auscultation bilaterally, normal work of breathing GI: soft, nontender, nondistended, + BS MS: no deformity or atrophy  Skin: warm and dry Neuro:  Strength and sensation are intact Psych: euthymic mood, full affect   Labs    Chemistry Recent Labs Lab 10/31/16 0817  11/02/16 0834 11/03/16 0537 11/04/16 0719  NA 130*  < > 135 137 139  K 2.4*   < > 3.5 4.3 3.2*  CL 100*  < > 107 108 107  CO2 21*  < > 17* 18* 23  GLUCOSE 99  < > 98 92 100*  BUN 12  < > 14 14 13   CREATININE 0.62  < > 0.58 0.55 0.60  CALCIUM 6.8*  < > 7.1* 6.6* 6.9*  PROT 5.6*  --  5.6*  --  5.3*  ALBUMIN 2.2*  --  2.4*  --  2.4*  AST 23  --  24  --  18  ALT 19  --  18  --  16  ALKPHOS 72  --  66  --  58  BILITOT 1.2  --  1.2  --  0.9  GFRNONAA >60  < > >60 >60 >60  GFRAA >60  < > >60 >60 >60  ANIONGAP 9  < > 11 11 9   < > = values in this interval not displayed.   Hematology Recent Labs Lab 11/01/16 0538 11/02/16 0358 11/04/16 0719  WBC 6.6 9.1 9.9  RBC 4.32 4.30 3.98  HGB 12.8 12.7 11.7*  HCT 37.7 37.4 35.9*  MCV  87.3 87.0 90.2  MCH 29.6 29.5 29.4  MCHC 34.0 34.0 32.6  RDW 17.5* 17.4* 17.8*  PLT 236 342 506*    Cardiac Enzymes Recent Labs Lab 10/28/16 2320 10/29/16 0705 10/29/16 1226 10/30/16 0441  TROPONINI 0.12* 0.07*  0.13* 0.14* 0.13*    Recent Labs Lab 10/28/16 1708 10/28/16 1938  TROPIPOC 0.06 0.10*     BNPNo results for input(s): BNP, PROBNP in the last 168 hours.   DDimer No results for input(s): DDIMER in the last 168 hours.   Radiology    Ct Head Wo Contrast  Result Date: 11/02/2016 CLINICAL DATA:  83 year old female with history of acute confusion. Multiple episodes of atrial fibrillation today. EXAM: CT HEAD WITHOUT CONTRAST TECHNIQUE: Contiguous axial images were obtained from the base of the skull through the vertex without intravenous contrast. COMPARISON:  Head CT 10/20/2016. FINDINGS: Brain: Mild cerebral atrophy. Patchy and confluent areas of decreased attenuation are noted throughout the deep and periventricular white matter of the cerebral hemispheres bilaterally, compatible with chronic microvascular ischemic disease. Chronic physiologic calcifications in the basal ganglia bilaterally. No evidence of acute infarction, hemorrhage, hydrocephalus, extra-axial collection or mass lesion/mass effect. Vascular: No  hyperdense vessel or unexpected calcification. Skull: Normal. Negative for fracture or focal lesion. Sinuses/Orbits: Chronic mucoperiosteal thickening, opacification and involution of the right maxillary sinus, similar to the prior study. Small left mastoid effusion unchanged. Other: None. IMPRESSION: 1. No acute intracranial abnormalities. 2. Mild cerebral atrophy with chronic microvascular ischemic changes in the cerebral white matter redemonstrated, as above. 3. Small left mastoid effusion is unchanged. Electronically Signed   By: Trudie Reed M.D.   On: 11/02/2016 12:48    Cardiac Studies   Echo 09/29/16 Study Conclusions  - Left ventricle: The cavity size was normal. Wall thickness was increased in a pattern of moderate LVH. Systolic function was vigorous. The estimated ejection fraction was in the range of 65% to 70%. Wall motion was normal; there were no regional wall motion abnormalities. The study is not technically sufficient to allow evaluation of LV diastolic function. - Aortic valve: Mildly to moderately calcified annulus. Trileaflet. - Mitral valve: Calcified annulus. There was mild to moderate regurgitation. - Right ventricle: The cavity size was mildly dilated. - Right atrium: The atrium was moderately dilated. - Tricuspid valve: There was moderate regurgitation. - Pulmonary arteries: PA peak pressure: 31 mm Hg (S). - Pericardium, extracardiac: There was no pericardial effusion. There was a right pleural effusion. There was a left pleural effusion.  Impressions:  - Moderate LVH with LVEF 65-70%. Indeterminate diastolic function. Mildly calcified mitral annulus with mild to moderate mitral regurgitation. Calcified aortic annulus. Mildly dilated right ventricle. Moderately dilated right atrium. Moderate tricuspid regurgitation with PASP estimated 31 mmHg. Evidence of bilateral pleural effusions.  Patient Profile     83 y.o. female with  hx of PAF, RA, HTN, recurrent UTI who brought by EMS for AMS and found to have sepsis 2nd to UTI and rhabdomyolysis. Cardiology is consulted for afib RVR.   Assessment & Plan   1. PAF Remaining in sinus rhythm between 24-48 hours now. Continue Eliquis for stroke prevention.  She is remaining in sinus rhythm, continue Amiodarone at 200 mg BID for 3 weeks. If she goes back into AF, would potentially need 400 mg BID for 2 weeks  This patients CHA2DS2-VASc Score and unadjusted Ischemic Stroke Rate (% per year) is equal to 3.2 % stroke rate/year from a score of 3  Above score calculated as 1 point  each if present [CHF, HTN, DM, Vascular=MI/PAD/Aortic Plaque, Age if 78-74, or Female] Above score calculated as 2 points each if present [Age > 75, or Stroke/TIA/TE]  2. Acute diastolic CHF: No clinical signs of volume overload  3. Mild to moderate MR:  Plan for yearly TTE.    SignedDorthula Matas, PA-C  11/04/2016, 9:38 AM     Patient examined chart reviewed Exam with clear lungs SEM palpable distal pulses. Maint NSR on current oral dose of amiodarone Continue eliquis bid will arrange outpatient f/u with Dr Rennis Golden   Will sign off   Charlton Haws

## 2016-11-04 NOTE — Clinical Social Work Placement (Signed)
   CLINICAL SOCIAL WORK PLACEMENT  NOTE  Date:  11/04/2016  Patient Details  Name: Haley Tucker MRN: [id number] Date of Birth: 1933/05/24  Clinical Social Work is seeking post-discharge placement for this patient at the Skilled  Nursing Facility level of care (*CSW will initial, date and re-position this form in  chart as items are completed):  Yes   Patient/family provided with [facility] Clinical Social Work Department's list of facilities offering this level of care within the geographic area requested by the patient (or if unable, by the patient's family).  Yes   Patient/family informed of their freedom to choose among providers that offer the needed level of care, that participate in Medicare, Medicaid or managed care program needed by the patient, have an available bed and are willing to accept the patient.  Yes   Patient/family informed of 's ownership interest in [facility] and [facility], as well as of the fact that they are under no obligation to receive care at these facilities.  PASRR submitted to EDS on       PASRR number received on       Existing PASRR number confirmed on 11/01/16     FL2 transmitted to all facilities in geographic area requested by pt/family on 11/01/16     FL2 transmitted to all facilities within larger geographic area on       Patient informed that his/her managed care company has contracts with or will negotiate with certain facilities, including the following:        Yes   Patient/family informed of bed offers received.  Patient chooses bed at [facility]     Physician recommends and patient chooses bed at      Patient to be transferred to [facility] on 11/04/16.  Patient to be transferred to facility by [facility]     Patient family notified on 11/04/16 of transfer.  Name of family member notified:  Rayna Sexton, nephew     PHYSICIAN Please sign DNR     Additional Comment:     _______________________________________________ Mearl Latin, LCSWA 11/04/2016, 11:16 AM

## 2016-11-04 NOTE — Discharge Summary (Signed)
Haley Tucker, is a 83 y.o. female  DOB 1933-12-13  MRN [id number].  Admission date:  10/28/2016  Admitting Physician  Oretha Milch, MD  Discharge Date:  11/04/2016   Primary MD  No primary care provider on file.  Recommendations for primary care physician for things to follow:    Severe sepsis (HCC) due to UTI - h/o frequent UTIs - vitals and labs as mentioned above consistent with severe sepsis - lactic acid has normalized - She has been receiving Vanc and Zosyn- Gr neg rods on U cultures- d/c'd Vanc - final cultures results show E coli sensitive to Ampicillin to which she has been switched on 7/2 - blood cultures continue to be NGTD - initial concern with right knee redness and swelling- was not a septic joint- right knee erythema and tenderness has resolved- likely was injured from a fall from possibly laying on it for a prolonged period-  - abdomen was tender on admission as well- no signs of diverticulitis or colitis on CT  continue amoxicillin 500mg  po tid x 3 days  Acute toxic encephalopathy-  - thought to be due to UTI/ Sepsis - resolved  A-fib with RVR- has been paroxysmal Cardiology recommends cardiac echo in 1 year Cont amiodarone and metoprolol and eliquis, please f/u with cardiology in 2 weeks.    Thrush - cont Mycelex Troch x 3 days  LVH with indeterminate diastolic function, Mod TR  - PASP 31   Acute hypoxic resp failure- acute on chronic diastolic CHF - brief episode thought to be due to iv hydration  Hypokalemia, Hypophosphatemia, hyponatremia - treating- Check cmp in 3 days  AKI - resolved with IVF   Elevated Troponin - mild elevation noted - suspect cardiac strain due to sepsis and A-fib - ECHO noted below shows no WMA  Rhabdomyolysis - resolved  Hypophosphatemia - replaced check phos in 3 days  Elevated T bili - follow- no  abnormality noted on CT abd/pelvis- possibly due to sepsis- has resolved  Hypoalbuminemia - due to sepsis/ infection and possibly poor diet at home?  Rheumatoid arthritis - resume Methotrexate - cont Prednisone 5 mg daily   Admission Diagnosis  Acute cystitis with hematuria [N30.01] Atrial fibrillation with RVR (HCC) [I48.91] Septic shock (HCC) [A41.9, R65.21] Altered mental status, unspecified altered mental status type [R41.82]   Discharge Diagnosis  Acute cystitis with hematuria [N30.01] Atrial fibrillation with RVR (HCC) [I48.91] Septic shock (HCC) [A41.9, R65.21] Altered mental status, unspecified altered mental status type [R41.82]    Principal Problem:   Acute metabolic encephalopathy Active Problems:   Severe sepsis (HCC)   Acute cystitis with hematuria   Atrial fibrillation with RVR (HCC)   Rhabdomyolysis   Acute diastolic (congestive) heart failure (HCC)   Thrush      No past medical history on file.  Past Surgical History:  Procedure Laterality Date  . CHOLECYSTECTOMY     per abdominal CT       HPI  from the history and physical  done on the day of admission:     83 year old female with PMH as below, which is significant for Rheumatoid arthritis, Afib, HTN, and recurrent urinary tract infections. She presented to [facility] ED 6/28 after family had not heard from her for two days. Reportedly she lives at home alone and when family had not heard from her they called EMS. She was last seen Monday 6/25 and decribed feeling weak, but had no specific complaints. Upon EMS arrival she did not answer the door. She was found down and had lost control of her bladder and bowels. She was noted to be altered and only oriented to self, which family reports is off of her baseline.   Upon arrival to the ED she was found to be hypotensive, hypoxemic, and in atrial fibrillation with RVR. She was given about 3 L of IVF resuscitation with conversion to NSR, but remained  borderline hypotensive. Hypoxia improved with 2L of nasal cannula oxygen.  Laboratory evaluation significant for serum creatinine 1.23, CO2 18, Ca 7.9, albumin 2.8, WBC 17.2, CK 743, and lactic acid 3.93. Lactic acid only cleared to 3.8 after IVF. UA with many bacteria and nitrite positive. She was given antibiotic therapy with Zosyn and Vancomycin. PCCM asked to see for admission.      Hospital Course:     Pt admitted for sepsis secondary to uti, and started on vanco/zosyn, urine culture grew out E. Coli sensitive to amoxicillin and patient transitioned to this abx.  Pt had Afib with RVR in the ED, and cardiology was consulted for this.  Initially cardizem GTT was used and then pt started on amiodarone.  Pt is currently on amiodarone 200mg  po bid and has had good rate control for the past 48 hours. Her rhabdo and mild ARF, resolved with Ns iv  , however the fluid did cause mild CHF   Given lasix and resolved.  Pt had some altered mental status but CT brain was negative and pt is currently back to baseline, and this was thought secondary to uti.   Pt has been afebrile, and she appears to have recovered from her sepsis / Afib with rvr but will need rehab for deconditioning.  Pt appears axox3, and will be discharge to SNF for rehab.  I discussed this with her nephew this am.     Follow UP  Contact information for after-discharge care    Destination    [facility] Follow up.   Specialty:  Skilled Nursing Facility Contact information: [address] [place] [address] [phone]               Consults obtained - cardiology  Discharge Condition: stable  Diet and Activity recommendation: See Discharge Instructions below  Discharge Instructions        Discharge Medications     Allergies as of 11/04/2016   No Known Allergies     Medication List    TAKE these medications   amiodarone 200 MG tablet Commonly known as:  PACERONE Take 1  tablet (200 mg total) by mouth 2 (two) times daily.   amoxicillin 500 MG capsule Commonly known as:  AMOXIL Take 1 capsule (500 mg total) by mouth 3 (three) times daily.   apixaban 5 MG Tabs tablet Commonly known as:  ELIQUIS Take 1 tablet (5 mg total) by mouth 2 (two) times daily.   clotrimazole 10 MG troche Commonly known as:  MYCELEX Take 1 tablet (10 mg total) by  mouth 5 (five) times daily.   famotidine 10 MG tablet Commonly known as:  PEPCID Take 10 mg by mouth 2 (two) times daily.   meclizine 25 MG tablet Commonly known as:  ANTIVERT Take 25 mg by mouth 3 (three) times daily as needed for dizziness.   methotrexate 2.5 MG tablet Commonly known as:  RHEUMATREX Take 20 mg by mouth once a week. Caution:Chemotherapy. Protect from light.   metoprolol tartrate 25 MG tablet Commonly known as:  LOPRESSOR Take 12.5 mg by mouth 2 (two) times daily.   phosphorus 155-852-130 MG tablet Commonly known as:  K PHOS NEUTRAL Take 2 tablets (500 mg total) by mouth 4 (four) times daily -  with meals and at bedtime.   potassium chloride SA 20 MEQ tablet Commonly known as:  K-DUR,KLOR-CON Take 20 mEq by mouth daily.   predniSONE 5 MG tablet Commonly known as:  DELTASONE Take 5 mg by mouth daily with breakfast.   saccharomyces boulardii 250 MG capsule Commonly known as:  FLORASTOR Take 1 capsule (250 mg total) by mouth 2 (two) times daily.   solifenacin 5 MG tablet Commonly known as:  VESICARE Take 5 mg by mouth at bedtime.   Vitamin D (Ergocalciferol) 50000 units Caps capsule Commonly known as:  DRISDOL Take 50,000 Units by mouth every 7 (seven) days.       Major procedures and Radiology Reports - PLEASE review detailed and final reports for all details, in brief -      Dg Chest 2 View  Result Date: 10/28/2016 CLINICAL DATA:  Possible sepsis. EXAM: CHEST  2 VIEW COMPARISON:  None. FINDINGS: The lungs are clear without focal pneumonia, edema, pneumothorax or pleural  effusion. Cardiopericardial silhouette is at upper limits of normal for size. The visualized bony structures of the thorax are intact. Telemetry leads overlie the chest. IMPRESSION: No active cardiopulmonary disease. Electronically Signed   By: Kennith Center M.D.   On: 10/28/2016 17:42   Dg Knee 2 Views Right  Result Date: 10/28/2016 CLINICAL DATA:  83 year old female with possible sepsis. Right knee swelling and warmth. EXAM: RIGHT KNEE - 1-2 VIEW COMPARISON:  [facility] right femur series 12/30/15 and earlier FINDINGS: Partially visible intramedullary rod in the distal right femur with distal interlocking cortical screw. This hardware appears stable since 2017. No loosening. Superimposed right total knee arthroplasty. The total-knee hardware also appears stable and intact. No definite right knee joint effusion. Osteopenia. No cortical osteolysis identified about the right knee. No acute fracture. No subcutaneous gas. IMPRESSION: Stable radiographic appearance of the distal right femur and right total knee orthopedic hardware since 2017. No acute osseous abnormality identified. Electronically Signed   By: Odessa Fleming M.D.   On: 10/28/2016 17:57   Ct Head Wo Contrast  Result Date: 11/02/2016 CLINICAL DATA:  83 year old female with history of acute confusion. Multiple episodes of atrial fibrillation today. EXAM: CT HEAD WITHOUT CONTRAST TECHNIQUE: Contiguous axial images were obtained from the base of the skull through the vertex without intravenous contrast. COMPARISON:  Head CT 10/20/2016. FINDINGS: Brain: Mild cerebral atrophy. Patchy and confluent areas of decreased attenuation are noted throughout the deep and periventricular white matter of the cerebral hemispheres bilaterally, compatible with chronic microvascular ischemic disease. Chronic physiologic calcifications in the basal ganglia bilaterally. No evidence of acute infarction, hemorrhage, hydrocephalus, extra-axial collection or mass lesion/mass  effect. Vascular: No hyperdense vessel or unexpected calcification. Skull: Normal. Negative for fracture or focal lesion. Sinuses/Orbits: Chronic mucoperiosteal thickening, opacification and involution of the  right maxillary sinus, similar to the prior study. Small left mastoid effusion unchanged. Other: None. IMPRESSION: 1. No acute intracranial abnormalities. 2. Mild cerebral atrophy with chronic microvascular ischemic changes in the cerebral white matter redemonstrated, as above. 3. Small left mastoid effusion is unchanged. Electronically Signed   By: Trudie Reed M.D.   On: 11/02/2016 12:48   Ct Head Wo Contrast  Result Date: 10/28/2016 CLINICAL DATA:  83 year old female with altered mental status. EXAM: CT HEAD WITHOUT CONTRAST TECHNIQUE: Contiguous axial images were obtained from the base of the skull through the vertex without intravenous contrast. COMPARISON:  01/22/2016 and earlier. FINDINGS: Brain: Stable cerebral volume. No midline shift, ventriculomegaly, mass effect, evidence of mass lesion, intracranial hemorrhage or evidence of cortically based acute infarction. Patchy bilateral white matter hypodensity is stable since 2017. No cortical encephalomalacia. Vascular: Calcified atherosclerosis at the skull base. No suspicious intracranial vascular hyperdensity. Skull: Stable.  No acute osseous abnormality identified. Sinuses/Orbits: Stable.  Chronic right maxillary opacification. Other: Stable orbit and scalp soft tissues. IMPRESSION: No acute intracranial abnormality. Stable non contrast CT appearance of the brain since 2017. Electronically Signed   By: Odessa Fleming M.D.   On: 10/28/2016 18:00   Ct Abdomen Pelvis W Contrast  Result Date: 10/28/2016 CLINICAL DATA:  Diffuse abdominal pain with fever EXAM: CT ABDOMEN AND PELVIS WITH CONTRAST TECHNIQUE: Multidetector CT imaging of the abdomen and pelvis was performed using the standard protocol following bolus administration of intravenous contrast.  CONTRAST:  ISOVUE-300 IOPAMIDOL (ISOVUE-300) INJECTION 61% COMPARISON:  05/31/2016 FINDINGS: Lower chest: Lung bases demonstrate patchy dependent atelectasis. No pleural effusion or consolidation. Coronary artery calcification. Trace pericardial effusion or thickening. Hepatobiliary: No focal liver abnormality is seen. Status post cholecystectomy. No biliary dilatation. Pancreas: Unremarkable. No pancreatic ductal dilatation or surrounding inflammatory changes. Spleen: Normal in size without focal abnormality. Adrenals/Urinary Tract: Adrenal glands are within normal limits. Subcentimeter hypodense lesions within the kidneys, too small to further characterize. Bladder unremarkable. Stomach/Bowel: Stomach is nonenlarged. No dilated small bowel. Appendix not well seen but no right lower quadrant inflammation is identified. Colon diverticula without acute inflammation. Moderate to large amount of stool in the colon with mild to moderate formed stools in the rectum. Vascular/Lymphatic: Aortic atherosclerosis. No enlarged abdominal or pelvic lymph nodes. Reproductive: Status post hysterectomy. No adnexal masses. Other: No free air or free fluid Musculoskeletal: Status post right femoral rodding. Old fracture deformities of the right inferior and superior pubic rami knee. Heterogenous lucency in the sacrum unchanged. No acute osseous abnormality. Multiple old left-sided rib fractures. IMPRESSION: 1. No CT evidence for acute intra-abdominal or pelvic pathology. Moderate to large volume of stool in the colon 2. Old fracture deformities of the right pubic rami. Multiple old left-sided rib fractures. 3. Diverticular disease of the colon but without acute inflammation 4. Subcentimeter hypodense lesions in the kidneys, too small to further characterize. Electronically Signed   By: Jasmine Pang M.D.   On: 10/28/2016 19:22   Dg Chest Port 1 View  Result Date: 10/30/2016 CLINICAL DATA:  Hypoxia. EXAM: PORTABLE CHEST 1  VIEW COMPARISON:  10/28/2016 FINDINGS: There is no opacity at both lung bases obscuring hemidiaphragms consistent with moderate bilateral effusions. Associated basilar opacity in the lungs is likely atelectasis. Pneumonia is not excluded. No evidence of pulmonary edema. Heart is mildly enlarged.  No mediastinal or hilar masses. No pneumothorax. IMPRESSION: 1. New bilateral moderate size pleural effusions with associated lung base opacity, the latter likely atelectasis. No evidence of pulmonary edema. Electronically  Signed   By: Amie Portland M.D.   On: 10/30/2016 08:41   Dg Abd Portable 1v  Result Date: 10/30/2016 CLINICAL DATA:  Abdominal tenderness.  History of a cholecystectomy. EXAM: PORTABLE ABDOMEN - 1 VIEW COMPARISON:  CT, 10/28/2016 FINDINGS: There is no bowel dilation to suggest obstruction. Mild increased stool is noted in the colon. There has been a prior cholecystectomy. Dense aortic and iliac vascular calcifications are noted. No evidence of a renal or ureteral stone. Soft tissues are otherwise unremarkable. There changes from the ORIF of a proximal right femur fracture. No acute skeletal abnormality. IMPRESSION: 1. No acute findings.  No evidence of bowel obstruction. Electronically Signed   By: Amie Portland M.D.   On: 10/30/2016 08:43    Micro Results    Recent Results (from the past 240 hour(s))  Culture, blood (Routine x 2)     Status: None   Collection Time: 10/28/16  4:55 PM  Result Value Ref Range Status   Specimen Description BLOOD LEFT ANTECUBITAL  Final   Special Requests   Final    BOTTLES DRAWN AEROBIC AND ANAEROBIC Blood Culture adequate volume   Culture NO GROWTH 5 DAYS  Final   Report Status 11/02/2016 FINAL  Final  Culture, blood (Routine x 2)     Status: None   Collection Time: 10/28/16  5:00 PM  Result Value Ref Range Status   Specimen Description BLOOD RIGHT ANTECUBITAL  Final   Special Requests   Final    BOTTLES DRAWN AEROBIC AND ANAEROBIC Blood Culture  adequate volume   Culture NO GROWTH 5 DAYS  Final   Report Status 11/02/2016 FINAL  Final  Urine culture     Status: Abnormal   Collection Time: 10/28/16  6:04 PM  Result Value Ref Range Status   Specimen Description URINE, CATHETERIZED  Final   Special Requests NONE  Final   Culture >=100,000 COLONIES/mL ESCHERICHIA COLI (A)  Final   Report Status 11/01/2016 FINAL  Final   Organism ID, Bacteria ESCHERICHIA COLI (A)  Final      Susceptibility   Escherichia coli - MIC*    AMPICILLIN <=2 SENSITIVE Sensitive     CEFAZOLIN <=4 SENSITIVE Sensitive     CEFTRIAXONE <=1 SENSITIVE Sensitive     CIPROFLOXACIN <=0.25 SENSITIVE Sensitive     GENTAMICIN <=1 SENSITIVE Sensitive     IMIPENEM <=0.25 SENSITIVE Sensitive     NITROFURANTOIN <=16 SENSITIVE Sensitive     TRIMETH/SULFA <=20 SENSITIVE Sensitive     AMPICILLIN/SULBACTAM <=2 SENSITIVE Sensitive     PIP/TAZO <=4 SENSITIVE Sensitive     Extended ESBL NEGATIVE Sensitive     * >=100,000 COLONIES/mL ESCHERICHIA COLI  MRSA PCR Screening     Status: Abnormal   Collection Time: 10/28/16 10:57 PM  Result Value Ref Range Status   MRSA by PCR INVALID RESULTS, SPECIMEN SENT FOR CULTURE (A) NEGATIVE Final    Comment: J.WOODY,RN AT 0330 BY L.PITT 10/29/16        The GeneXpert MRSA Assay (FDA approved for NASAL specimens only), is one component of a comprehensive MRSA colonization surveillance program. It is not intended to diagnose MRSA infection nor to guide or monitor treatment for MRSA infections.   MRSA culture     Status: None   Collection Time: 10/28/16 10:57 PM  Result Value Ref Range Status   Specimen Description NASAL SWAB  Final   Special Requests NONE  Final   Culture NO MRSA DETECTED  Final   Report Status  10/30/2016 FINAL  Final  MRSA PCR Screening     Status: None   Collection Time: 10/30/16  6:43 PM  Result Value Ref Range Status   MRSA by PCR NEGATIVE NEGATIVE Final    Comment:        The GeneXpert MRSA Assay  (FDA approved for NASAL specimens only), is one component of a comprehensive MRSA colonization surveillance program. It is not intended to diagnose MRSA infection nor to guide or monitor treatment for MRSA infections.        Today   Subjective    Carolyne Fiscal today axox3,  Doing well.  has no headache,no chest abdominal pain,no new weakness tingling or numbness, feels much better wants to go SNF  today.    Objective   Blood pressure (!) 148/73, pulse 85, temperature 99.5 F (37.5 C), temperature source Oral, resp. rate 18, height 5\' 2"  (1.575 m), weight 4.99 kg (11 lb), SpO2 97 %.  No intake or output data in the 24 hours ending 11/04/16 1016  Exam Awake Alert, Oriented x 3, No new F.N deficits, Normal affect Hand.AT,PERRAL Supple Neck,No JVD, No cervical lymphadenopathy appriciated.  Symmetrical Chest wall movement, Good air movement bilaterally, CTAB RRR,No Gallops,Rubs or new Murmurs, No Parasternal Heave +ve B.Sounds, Abd Soft, Non tender, No organomegaly appriciated, No rebound -guarding or rigidity. No Cyanosis, Clubbing or edema, No new Rash or bruise   Data Review   CBC w Diff:  Lab Results  Component Value Date   WBC 9.9 11/04/2016   HGB 11.7 (L) 11/04/2016   HCT 35.9 (L) 11/04/2016   PLT 506 (H) 11/04/2016   LYMPHOPCT 6 10/28/2016   MONOPCT 11 10/28/2016   EOSPCT 0 10/28/2016   BASOPCT 0 10/28/2016    CMP:  Lab Results  Component Value Date   NA 139 11/04/2016   K 3.2 (L) 11/04/2016   CL 107 11/04/2016   CO2 23 11/04/2016   BUN 13 11/04/2016   CREATININE 0.60 11/04/2016   PROT 5.3 (L) 11/04/2016   ALBUMIN 2.4 (L) 11/04/2016   BILITOT 0.9 11/04/2016   ALKPHOS 58 11/04/2016   AST 18 11/04/2016   ALT 16 11/04/2016  .   Total Time in preparing paper work, data evaluation and todays exam - 35 minutes  Pearson Grippe M.D on 11/04/2016 at 10:16 AM  [facility]   Office  [phone]

## 2016-11-04 NOTE — Progress Notes (Signed)
Patient will DC to: [facility] Anticipated DC date: 11/04/16 Family notified: Nephew Transport by: Sharin Mons    Per MD patient ready for DC to [facility]. RN, patient, patient's family, and facility notified of DC. Discharge Summary sent to facility. RN given number for report [phone]). DC packet on chart. Ambulance transport requested for patient.   CSW signing off.  Cristobal Goldmann, [place] Clinical Social Worker [phone]

## 2016-11-05 DIAGNOSIS — N390 Urinary tract infection, site not specified: Secondary | ICD-10-CM | POA: Diagnosis not present

## 2016-11-05 DIAGNOSIS — M069 Rheumatoid arthritis, unspecified: Secondary | ICD-10-CM | POA: Diagnosis not present

## 2016-11-05 DIAGNOSIS — I10 Essential (primary) hypertension: Secondary | ICD-10-CM | POA: Diagnosis not present

## 2016-11-05 DIAGNOSIS — I480 Paroxysmal atrial fibrillation: Secondary | ICD-10-CM | POA: Diagnosis not present

## 2016-11-12 DIAGNOSIS — I10 Essential (primary) hypertension: Secondary | ICD-10-CM | POA: Diagnosis not present

## 2016-11-12 DIAGNOSIS — I5032 Chronic diastolic (congestive) heart failure: Secondary | ICD-10-CM | POA: Diagnosis not present

## 2016-11-12 DIAGNOSIS — R531 Weakness: Secondary | ICD-10-CM | POA: Diagnosis not present

## 2016-11-12 DIAGNOSIS — M069 Rheumatoid arthritis, unspecified: Secondary | ICD-10-CM | POA: Diagnosis not present

## 2016-11-17 ENCOUNTER — Other Ambulatory Visit: Payer: Self-pay | Admitting: *Deleted

## 2016-11-17 NOTE — Patient Outreach (Signed)
[facility]) Care Management  11/17/2016  Haley Tucker [birth year] [id number]   Noted per PatientPing patient admitted to [facility]. Communication with Barnetta Hammersmith, LPN discharge planner.  She reports that patient is being evaluated for ALF placement at [facility] upon completion of Short term rehab.  Plan to sign off, requested that Kim let RNCM know if discharge plans change and patient discharge home. She agreed with plan.  Alben Spittle. Albertha Ghee, RN, BSN, CCM  Post Acute Chartered loss adjuster [phone]) Business Cell  [phone]) Toll Free Office

## 2016-11-24 DIAGNOSIS — Z9181 History of falling: Secondary | ICD-10-CM | POA: Diagnosis not present

## 2016-11-24 DIAGNOSIS — M810 Age-related osteoporosis without current pathological fracture: Secondary | ICD-10-CM | POA: Diagnosis not present

## 2016-11-24 DIAGNOSIS — K5900 Constipation, unspecified: Secondary | ICD-10-CM | POA: Diagnosis not present

## 2016-11-24 DIAGNOSIS — I110 Hypertensive heart disease with heart failure: Secondary | ICD-10-CM | POA: Diagnosis not present

## 2016-11-24 DIAGNOSIS — Z87440 Personal history of urinary (tract) infections: Secondary | ICD-10-CM | POA: Diagnosis not present

## 2016-11-24 DIAGNOSIS — I482 Chronic atrial fibrillation: Secondary | ICD-10-CM | POA: Diagnosis not present

## 2016-11-24 DIAGNOSIS — M069 Rheumatoid arthritis, unspecified: Secondary | ICD-10-CM | POA: Diagnosis not present

## 2016-11-24 DIAGNOSIS — N3281 Overactive bladder: Secondary | ICD-10-CM | POA: Diagnosis not present

## 2016-11-24 DIAGNOSIS — M1990 Unspecified osteoarthritis, unspecified site: Secondary | ICD-10-CM | POA: Diagnosis not present

## 2016-11-24 DIAGNOSIS — I5032 Chronic diastolic (congestive) heart failure: Secondary | ICD-10-CM | POA: Diagnosis not present

## 2016-11-24 DIAGNOSIS — Z7901 Long term (current) use of anticoagulants: Secondary | ICD-10-CM | POA: Diagnosis not present

## 2016-11-24 DIAGNOSIS — Z96651 Presence of right artificial knee joint: Secondary | ICD-10-CM | POA: Diagnosis not present

## 2016-11-30 DIAGNOSIS — Z87440 Personal history of urinary (tract) infections: Secondary | ICD-10-CM | POA: Diagnosis not present

## 2016-11-30 DIAGNOSIS — N3281 Overactive bladder: Secondary | ICD-10-CM | POA: Diagnosis not present

## 2016-11-30 DIAGNOSIS — S0993XA Unspecified injury of face, initial encounter: Secondary | ICD-10-CM | POA: Diagnosis not present

## 2016-11-30 DIAGNOSIS — N3000 Acute cystitis without hematuria: Secondary | ICD-10-CM | POA: Diagnosis not present

## 2016-11-30 DIAGNOSIS — M069 Rheumatoid arthritis, unspecified: Secondary | ICD-10-CM | POA: Diagnosis not present

## 2016-11-30 DIAGNOSIS — E038 Other specified hypothyroidism: Secondary | ICD-10-CM | POA: Diagnosis not present

## 2016-11-30 DIAGNOSIS — J90 Pleural effusion, not elsewhere classified: Secondary | ICD-10-CM | POA: Diagnosis not present

## 2016-11-30 DIAGNOSIS — I110 Hypertensive heart disease with heart failure: Secondary | ICD-10-CM | POA: Diagnosis not present

## 2016-11-30 DIAGNOSIS — I5032 Chronic diastolic (congestive) heart failure: Secondary | ICD-10-CM | POA: Diagnosis not present

## 2016-11-30 DIAGNOSIS — S0990XA Unspecified injury of head, initial encounter: Secondary | ICD-10-CM | POA: Diagnosis not present

## 2016-11-30 DIAGNOSIS — M1990 Unspecified osteoarthritis, unspecified site: Secondary | ICD-10-CM | POA: Diagnosis not present

## 2016-11-30 DIAGNOSIS — J9811 Atelectasis: Secondary | ICD-10-CM | POA: Diagnosis not present

## 2016-11-30 DIAGNOSIS — I482 Chronic atrial fibrillation: Secondary | ICD-10-CM | POA: Diagnosis not present

## 2016-11-30 DIAGNOSIS — Z7901 Long term (current) use of anticoagulants: Secondary | ICD-10-CM | POA: Diagnosis not present

## 2016-11-30 DIAGNOSIS — D518 Other vitamin B12 deficiency anemias: Secondary | ICD-10-CM | POA: Diagnosis not present

## 2016-11-30 DIAGNOSIS — Z96651 Presence of right artificial knee joint: Secondary | ICD-10-CM | POA: Diagnosis not present

## 2016-11-30 DIAGNOSIS — Z79899 Other long term (current) drug therapy: Secondary | ICD-10-CM | POA: Diagnosis not present

## 2016-11-30 DIAGNOSIS — E782 Mixed hyperlipidemia: Secondary | ICD-10-CM | POA: Diagnosis not present

## 2016-11-30 DIAGNOSIS — K5900 Constipation, unspecified: Secondary | ICD-10-CM | POA: Diagnosis not present

## 2016-11-30 DIAGNOSIS — R2689 Other abnormalities of gait and mobility: Secondary | ICD-10-CM | POA: Diagnosis not present

## 2016-11-30 DIAGNOSIS — E119 Type 2 diabetes mellitus without complications: Secondary | ICD-10-CM | POA: Diagnosis not present

## 2016-11-30 DIAGNOSIS — M810 Age-related osteoporosis without current pathological fracture: Secondary | ICD-10-CM | POA: Diagnosis not present

## 2016-11-30 DIAGNOSIS — R51 Headache: Secondary | ICD-10-CM | POA: Diagnosis not present

## 2016-12-01 DIAGNOSIS — M069 Rheumatoid arthritis, unspecified: Secondary | ICD-10-CM | POA: Diagnosis not present

## 2016-12-01 DIAGNOSIS — N390 Urinary tract infection, site not specified: Secondary | ICD-10-CM | POA: Diagnosis not present

## 2016-12-01 DIAGNOSIS — I480 Paroxysmal atrial fibrillation: Secondary | ICD-10-CM | POA: Diagnosis not present

## 2016-12-01 DIAGNOSIS — I10 Essential (primary) hypertension: Secondary | ICD-10-CM | POA: Diagnosis not present

## 2016-12-02 DIAGNOSIS — Z87440 Personal history of urinary (tract) infections: Secondary | ICD-10-CM | POA: Diagnosis not present

## 2016-12-02 DIAGNOSIS — I5032 Chronic diastolic (congestive) heart failure: Secondary | ICD-10-CM | POA: Diagnosis not present

## 2016-12-02 DIAGNOSIS — I110 Hypertensive heart disease with heart failure: Secondary | ICD-10-CM | POA: Diagnosis not present

## 2016-12-02 DIAGNOSIS — I482 Chronic atrial fibrillation: Secondary | ICD-10-CM | POA: Diagnosis not present

## 2016-12-02 DIAGNOSIS — N3281 Overactive bladder: Secondary | ICD-10-CM | POA: Diagnosis not present

## 2016-12-02 DIAGNOSIS — K5900 Constipation, unspecified: Secondary | ICD-10-CM | POA: Diagnosis not present

## 2016-12-02 DIAGNOSIS — Z96651 Presence of right artificial knee joint: Secondary | ICD-10-CM | POA: Diagnosis not present

## 2016-12-02 DIAGNOSIS — M1990 Unspecified osteoarthritis, unspecified site: Secondary | ICD-10-CM | POA: Diagnosis not present

## 2016-12-02 DIAGNOSIS — Z7901 Long term (current) use of anticoagulants: Secondary | ICD-10-CM | POA: Diagnosis not present

## 2016-12-02 DIAGNOSIS — M069 Rheumatoid arthritis, unspecified: Secondary | ICD-10-CM | POA: Diagnosis not present

## 2016-12-02 DIAGNOSIS — M810 Age-related osteoporosis without current pathological fracture: Secondary | ICD-10-CM | POA: Diagnosis not present

## 2016-12-03 DIAGNOSIS — I9589 Other hypotension: Secondary | ICD-10-CM | POA: Diagnosis not present

## 2016-12-03 DIAGNOSIS — R001 Bradycardia, unspecified: Secondary | ICD-10-CM | POA: Diagnosis not present

## 2016-12-03 DIAGNOSIS — R1310 Dysphagia, unspecified: Secondary | ICD-10-CM | POA: Diagnosis not present

## 2016-12-03 DIAGNOSIS — M6281 Muscle weakness (generalized): Secondary | ICD-10-CM | POA: Diagnosis not present

## 2016-12-03 DIAGNOSIS — Z66 Do not resuscitate: Secondary | ICD-10-CM | POA: Diagnosis not present

## 2016-12-03 DIAGNOSIS — M059 Rheumatoid arthritis with rheumatoid factor, unspecified: Secondary | ICD-10-CM | POA: Diagnosis not present

## 2016-12-03 DIAGNOSIS — I509 Heart failure, unspecified: Secondary | ICD-10-CM | POA: Diagnosis not present

## 2016-12-03 DIAGNOSIS — R1110 Vomiting, unspecified: Secondary | ICD-10-CM | POA: Diagnosis not present

## 2016-12-03 DIAGNOSIS — I4891 Unspecified atrial fibrillation: Secondary | ICD-10-CM | POA: Diagnosis not present

## 2016-12-03 DIAGNOSIS — R5381 Other malaise: Secondary | ICD-10-CM | POA: Diagnosis not present

## 2016-12-03 DIAGNOSIS — E871 Hypo-osmolality and hyponatremia: Secondary | ICD-10-CM | POA: Diagnosis not present

## 2016-12-03 DIAGNOSIS — K219 Gastro-esophageal reflux disease without esophagitis: Secondary | ICD-10-CM | POA: Diagnosis not present

## 2016-12-03 DIAGNOSIS — I5032 Chronic diastolic (congestive) heart failure: Secondary | ICD-10-CM | POA: Diagnosis not present

## 2016-12-03 DIAGNOSIS — Z87440 Personal history of urinary (tract) infections: Secondary | ICD-10-CM | POA: Diagnosis not present

## 2016-12-03 DIAGNOSIS — R42 Dizziness and giddiness: Secondary | ICD-10-CM | POA: Diagnosis not present

## 2016-12-03 DIAGNOSIS — I952 Hypotension due to drugs: Secondary | ICD-10-CM | POA: Diagnosis not present

## 2016-12-03 DIAGNOSIS — R2689 Other abnormalities of gait and mobility: Secondary | ICD-10-CM | POA: Diagnosis not present

## 2016-12-03 DIAGNOSIS — R1312 Dysphagia, oropharyngeal phase: Secondary | ICD-10-CM | POA: Diagnosis not present

## 2016-12-03 DIAGNOSIS — K5900 Constipation, unspecified: Secondary | ICD-10-CM | POA: Diagnosis not present

## 2016-12-03 DIAGNOSIS — R2681 Unsteadiness on feet: Secondary | ICD-10-CM | POA: Diagnosis not present

## 2016-12-03 DIAGNOSIS — E860 Dehydration: Secondary | ICD-10-CM | POA: Diagnosis not present

## 2016-12-03 DIAGNOSIS — I110 Hypertensive heart disease with heart failure: Secondary | ICD-10-CM | POA: Diagnosis not present

## 2016-12-03 DIAGNOSIS — Z96651 Presence of right artificial knee joint: Secondary | ICD-10-CM | POA: Diagnosis not present

## 2016-12-03 DIAGNOSIS — M069 Rheumatoid arthritis, unspecified: Secondary | ICD-10-CM | POA: Diagnosis not present

## 2016-12-03 DIAGNOSIS — F0390 Unspecified dementia without behavioral disturbance: Secondary | ICD-10-CM | POA: Diagnosis not present

## 2016-12-03 DIAGNOSIS — I959 Hypotension, unspecified: Secondary | ICD-10-CM | POA: Diagnosis not present

## 2016-12-03 DIAGNOSIS — N3281 Overactive bladder: Secondary | ICD-10-CM | POA: Diagnosis not present

## 2016-12-03 DIAGNOSIS — R509 Fever, unspecified: Secondary | ICD-10-CM | POA: Diagnosis not present

## 2016-12-03 DIAGNOSIS — Z79891 Long term (current) use of opiate analgesic: Secondary | ICD-10-CM | POA: Diagnosis not present

## 2016-12-03 DIAGNOSIS — Z7901 Long term (current) use of anticoagulants: Secondary | ICD-10-CM | POA: Diagnosis not present

## 2016-12-03 DIAGNOSIS — T462X5A Adverse effect of other antidysrhythmic drugs, initial encounter: Secondary | ICD-10-CM | POA: Diagnosis not present

## 2016-12-03 DIAGNOSIS — M1990 Unspecified osteoarthritis, unspecified site: Secondary | ICD-10-CM | POA: Diagnosis not present

## 2016-12-03 DIAGNOSIS — I482 Chronic atrial fibrillation: Secondary | ICD-10-CM | POA: Diagnosis not present

## 2016-12-03 DIAGNOSIS — L03114 Cellulitis of left upper limb: Secondary | ICD-10-CM | POA: Diagnosis not present

## 2016-12-03 DIAGNOSIS — E875 Hyperkalemia: Secondary | ICD-10-CM | POA: Diagnosis not present

## 2016-12-03 DIAGNOSIS — M810 Age-related osteoporosis without current pathological fracture: Secondary | ICD-10-CM | POA: Diagnosis not present

## 2016-12-03 DIAGNOSIS — I499 Cardiac arrhythmia, unspecified: Secondary | ICD-10-CM | POA: Diagnosis not present

## 2016-12-03 DIAGNOSIS — I480 Paroxysmal atrial fibrillation: Secondary | ICD-10-CM | POA: Diagnosis not present

## 2016-12-04 DIAGNOSIS — E875 Hyperkalemia: Secondary | ICD-10-CM | POA: Diagnosis not present

## 2016-12-04 DIAGNOSIS — I959 Hypotension, unspecified: Secondary | ICD-10-CM | POA: Diagnosis not present

## 2016-12-04 DIAGNOSIS — E871 Hypo-osmolality and hyponatremia: Secondary | ICD-10-CM | POA: Diagnosis not present

## 2016-12-04 DIAGNOSIS — R001 Bradycardia, unspecified: Secondary | ICD-10-CM | POA: Diagnosis not present

## 2016-12-04 DIAGNOSIS — M069 Rheumatoid arthritis, unspecified: Secondary | ICD-10-CM | POA: Diagnosis not present

## 2016-12-04 DIAGNOSIS — F0390 Unspecified dementia without behavioral disturbance: Secondary | ICD-10-CM | POA: Diagnosis not present

## 2016-12-04 DIAGNOSIS — I480 Paroxysmal atrial fibrillation: Secondary | ICD-10-CM | POA: Diagnosis not present

## 2016-12-05 DIAGNOSIS — I480 Paroxysmal atrial fibrillation: Secondary | ICD-10-CM | POA: Diagnosis not present

## 2016-12-06 DIAGNOSIS — I480 Paroxysmal atrial fibrillation: Secondary | ICD-10-CM | POA: Diagnosis not present

## 2016-12-06 DIAGNOSIS — I959 Hypotension, unspecified: Secondary | ICD-10-CM | POA: Diagnosis not present

## 2016-12-06 DIAGNOSIS — R001 Bradycardia, unspecified: Secondary | ICD-10-CM | POA: Diagnosis not present

## 2016-12-06 DIAGNOSIS — L03114 Cellulitis of left upper limb: Secondary | ICD-10-CM | POA: Diagnosis not present

## 2016-12-07 DIAGNOSIS — Z7901 Long term (current) use of anticoagulants: Secondary | ICD-10-CM | POA: Diagnosis not present

## 2016-12-07 DIAGNOSIS — R2689 Other abnormalities of gait and mobility: Secondary | ICD-10-CM | POA: Diagnosis not present

## 2016-12-07 DIAGNOSIS — R1312 Dysphagia, oropharyngeal phase: Secondary | ICD-10-CM | POA: Diagnosis not present

## 2016-12-07 DIAGNOSIS — R54 Age-related physical debility: Secondary | ICD-10-CM | POA: Diagnosis not present

## 2016-12-07 DIAGNOSIS — I4891 Unspecified atrial fibrillation: Secondary | ICD-10-CM | POA: Diagnosis not present

## 2016-12-07 DIAGNOSIS — I9589 Other hypotension: Secondary | ICD-10-CM | POA: Diagnosis not present

## 2016-12-07 DIAGNOSIS — E871 Hypo-osmolality and hyponatremia: Secondary | ICD-10-CM | POA: Diagnosis not present

## 2016-12-07 DIAGNOSIS — M059 Rheumatoid arthritis with rheumatoid factor, unspecified: Secondary | ICD-10-CM | POA: Diagnosis not present

## 2016-12-07 DIAGNOSIS — H6120 Impacted cerumen, unspecified ear: Secondary | ICD-10-CM | POA: Diagnosis not present

## 2016-12-07 DIAGNOSIS — K219 Gastro-esophageal reflux disease without esophagitis: Secondary | ICD-10-CM | POA: Diagnosis not present

## 2016-12-07 DIAGNOSIS — R2681 Unsteadiness on feet: Secondary | ICD-10-CM | POA: Diagnosis not present

## 2016-12-07 DIAGNOSIS — R42 Dizziness and giddiness: Secondary | ICD-10-CM | POA: Diagnosis not present

## 2016-12-07 DIAGNOSIS — K5900 Constipation, unspecified: Secondary | ICD-10-CM | POA: Diagnosis not present

## 2016-12-07 DIAGNOSIS — I959 Hypotension, unspecified: Secondary | ICD-10-CM | POA: Diagnosis not present

## 2016-12-07 DIAGNOSIS — R1310 Dysphagia, unspecified: Secondary | ICD-10-CM | POA: Diagnosis not present

## 2016-12-07 DIAGNOSIS — Z23 Encounter for immunization: Secondary | ICD-10-CM | POA: Diagnosis not present

## 2016-12-07 DIAGNOSIS — M6281 Muscle weakness (generalized): Secondary | ICD-10-CM | POA: Diagnosis not present

## 2016-12-07 DIAGNOSIS — I110 Hypertensive heart disease with heart failure: Secondary | ICD-10-CM | POA: Diagnosis not present

## 2016-12-07 DIAGNOSIS — R5381 Other malaise: Secondary | ICD-10-CM | POA: Diagnosis not present

## 2016-12-07 DIAGNOSIS — R001 Bradycardia, unspecified: Secondary | ICD-10-CM | POA: Diagnosis not present

## 2016-12-07 DIAGNOSIS — I5032 Chronic diastolic (congestive) heart failure: Secondary | ICD-10-CM | POA: Diagnosis not present

## 2016-12-07 DIAGNOSIS — I482 Chronic atrial fibrillation: Secondary | ICD-10-CM | POA: Diagnosis not present

## 2016-12-07 DIAGNOSIS — L03114 Cellulitis of left upper limb: Secondary | ICD-10-CM | POA: Diagnosis not present

## 2016-12-10 DIAGNOSIS — I5032 Chronic diastolic (congestive) heart failure: Secondary | ICD-10-CM | POA: Diagnosis not present

## 2016-12-10 DIAGNOSIS — I110 Hypertensive heart disease with heart failure: Secondary | ICD-10-CM | POA: Diagnosis not present

## 2016-12-10 DIAGNOSIS — I482 Chronic atrial fibrillation: Secondary | ICD-10-CM | POA: Diagnosis not present

## 2016-12-29 DIAGNOSIS — K5900 Constipation, unspecified: Secondary | ICD-10-CM | POA: Diagnosis not present

## 2016-12-29 DIAGNOSIS — R54 Age-related physical debility: Secondary | ICD-10-CM | POA: Diagnosis not present

## 2017-01-05 DIAGNOSIS — Z7901 Long term (current) use of anticoagulants: Secondary | ICD-10-CM | POA: Diagnosis not present

## 2017-01-05 DIAGNOSIS — R5381 Other malaise: Secondary | ICD-10-CM | POA: Diagnosis not present

## 2017-01-05 DIAGNOSIS — I4891 Unspecified atrial fibrillation: Secondary | ICD-10-CM | POA: Diagnosis not present

## 2017-01-05 DIAGNOSIS — L03114 Cellulitis of left upper limb: Secondary | ICD-10-CM | POA: Diagnosis not present

## 2017-01-07 DIAGNOSIS — H6120 Impacted cerumen, unspecified ear: Secondary | ICD-10-CM | POA: Diagnosis not present

## 2017-01-07 DIAGNOSIS — E871 Hypo-osmolality and hyponatremia: Secondary | ICD-10-CM | POA: Diagnosis not present

## 2017-01-08 DIAGNOSIS — M6281 Muscle weakness (generalized): Secondary | ICD-10-CM | POA: Diagnosis not present

## 2017-01-08 DIAGNOSIS — Z23 Encounter for immunization: Secondary | ICD-10-CM | POA: Diagnosis not present

## 2017-01-08 DIAGNOSIS — R2689 Other abnormalities of gait and mobility: Secondary | ICD-10-CM | POA: Diagnosis not present

## 2017-01-08 DIAGNOSIS — R2681 Unsteadiness on feet: Secondary | ICD-10-CM | POA: Diagnosis not present

## 2017-01-08 DIAGNOSIS — L03114 Cellulitis of left upper limb: Secondary | ICD-10-CM | POA: Diagnosis not present

## 2017-01-19 DIAGNOSIS — R410 Disorientation, unspecified: Secondary | ICD-10-CM | POA: Diagnosis not present

## 2017-01-24 DIAGNOSIS — K068 Other specified disorders of gingiva and edentulous alveolar ridge: Secondary | ICD-10-CM | POA: Diagnosis not present

## 2017-01-25 DIAGNOSIS — K219 Gastro-esophageal reflux disease without esophagitis: Secondary | ICD-10-CM | POA: Diagnosis not present

## 2017-01-25 DIAGNOSIS — I959 Hypotension, unspecified: Secondary | ICD-10-CM | POA: Diagnosis not present

## 2017-01-25 DIAGNOSIS — I4891 Unspecified atrial fibrillation: Secondary | ICD-10-CM | POA: Diagnosis not present

## 2017-01-25 DIAGNOSIS — Z79899 Other long term (current) drug therapy: Secondary | ICD-10-CM | POA: Diagnosis not present

## 2017-01-25 DIAGNOSIS — S0003XA Contusion of scalp, initial encounter: Secondary | ICD-10-CM | POA: Diagnosis not present

## 2017-01-25 DIAGNOSIS — M069 Rheumatoid arthritis, unspecified: Secondary | ICD-10-CM | POA: Diagnosis not present

## 2017-01-25 DIAGNOSIS — S0093XA Contusion of unspecified part of head, initial encounter: Secondary | ICD-10-CM | POA: Diagnosis not present

## 2017-01-25 DIAGNOSIS — Z7901 Long term (current) use of anticoagulants: Secondary | ICD-10-CM | POA: Diagnosis not present

## 2017-01-25 DIAGNOSIS — Z043 Encounter for examination and observation following other accident: Secondary | ICD-10-CM | POA: Diagnosis not present

## 2017-01-25 DIAGNOSIS — S0180XA Unspecified open wound of other part of head, initial encounter: Secondary | ICD-10-CM | POA: Diagnosis not present

## 2017-01-26 DIAGNOSIS — Z7901 Long term (current) use of anticoagulants: Secondary | ICD-10-CM | POA: Diagnosis not present

## 2017-01-26 DIAGNOSIS — Z8614 Personal history of Methicillin resistant Staphylococcus aureus infection: Secondary | ICD-10-CM | POA: Diagnosis not present

## 2017-01-26 DIAGNOSIS — Z79899 Other long term (current) drug therapy: Secondary | ICD-10-CM | POA: Diagnosis not present

## 2017-01-26 DIAGNOSIS — E871 Hypo-osmolality and hyponatremia: Secondary | ICD-10-CM | POA: Diagnosis not present

## 2017-01-26 DIAGNOSIS — R5081 Fever presenting with conditions classified elsewhere: Secondary | ICD-10-CM | POA: Diagnosis not present

## 2017-01-26 DIAGNOSIS — L03113 Cellulitis of right upper limb: Secondary | ICD-10-CM | POA: Diagnosis not present

## 2017-01-26 DIAGNOSIS — N390 Urinary tract infection, site not specified: Secondary | ICD-10-CM | POA: Diagnosis not present

## 2017-01-26 DIAGNOSIS — M19031 Primary osteoarthritis, right wrist: Secondary | ICD-10-CM | POA: Diagnosis not present

## 2017-01-26 DIAGNOSIS — S0003XA Contusion of scalp, initial encounter: Secondary | ICD-10-CM | POA: Diagnosis not present

## 2017-01-26 DIAGNOSIS — R509 Fever, unspecified: Secondary | ICD-10-CM | POA: Diagnosis not present

## 2017-01-26 DIAGNOSIS — H7092 Unspecified mastoiditis, left ear: Secondary | ICD-10-CM | POA: Diagnosis not present

## 2017-01-26 DIAGNOSIS — A419 Sepsis, unspecified organism: Secondary | ICD-10-CM | POA: Diagnosis not present

## 2017-01-26 DIAGNOSIS — S6992XA Unspecified injury of left wrist, hand and finger(s), initial encounter: Secondary | ICD-10-CM | POA: Diagnosis not present

## 2017-01-26 DIAGNOSIS — M7989 Other specified soft tissue disorders: Secondary | ICD-10-CM | POA: Diagnosis not present

## 2017-01-26 DIAGNOSIS — M069 Rheumatoid arthritis, unspecified: Secondary | ICD-10-CM | POA: Diagnosis not present

## 2017-01-26 DIAGNOSIS — Z043 Encounter for examination and observation following other accident: Secondary | ICD-10-CM | POA: Diagnosis not present

## 2017-01-26 DIAGNOSIS — Z66 Do not resuscitate: Secondary | ICD-10-CM | POA: Diagnosis not present

## 2017-01-27 DIAGNOSIS — R9431 Abnormal electrocardiogram [ECG] [EKG]: Secondary | ICD-10-CM | POA: Diagnosis not present

## 2017-01-27 DIAGNOSIS — A419 Sepsis, unspecified organism: Secondary | ICD-10-CM | POA: Diagnosis not present

## 2017-01-27 DIAGNOSIS — N3001 Acute cystitis with hematuria: Secondary | ICD-10-CM | POA: Diagnosis not present

## 2017-01-28 DIAGNOSIS — N3001 Acute cystitis with hematuria: Secondary | ICD-10-CM | POA: Diagnosis not present

## 2017-01-31 DIAGNOSIS — M7989 Other specified soft tissue disorders: Secondary | ICD-10-CM | POA: Diagnosis not present

## 2017-02-03 DIAGNOSIS — T801XXA Vascular complications following infusion, transfusion and therapeutic injection, initial encounter: Secondary | ICD-10-CM | POA: Diagnosis not present

## 2017-02-03 DIAGNOSIS — N390 Urinary tract infection, site not specified: Secondary | ICD-10-CM | POA: Diagnosis not present

## 2017-02-03 DIAGNOSIS — M7989 Other specified soft tissue disorders: Secondary | ICD-10-CM | POA: Diagnosis not present

## 2017-02-03 DIAGNOSIS — M79642 Pain in left hand: Secondary | ICD-10-CM | POA: Diagnosis not present

## 2017-02-10 DIAGNOSIS — L03114 Cellulitis of left upper limb: Secondary | ICD-10-CM | POA: Diagnosis not present

## 2017-02-10 DIAGNOSIS — M7989 Other specified soft tissue disorders: Secondary | ICD-10-CM | POA: Diagnosis not present

## 2017-02-10 DIAGNOSIS — R2243 Localized swelling, mass and lump, lower limb, bilateral: Secondary | ICD-10-CM | POA: Diagnosis not present

## 2017-02-15 DIAGNOSIS — R1312 Dysphagia, oropharyngeal phase: Secondary | ICD-10-CM | POA: Diagnosis not present

## 2017-02-15 DIAGNOSIS — R001 Bradycardia, unspecified: Secondary | ICD-10-CM | POA: Diagnosis not present

## 2017-02-16 DIAGNOSIS — Z789 Other specified health status: Secondary | ICD-10-CM | POA: Diagnosis not present

## 2017-02-16 DIAGNOSIS — N390 Urinary tract infection, site not specified: Secondary | ICD-10-CM | POA: Diagnosis not present

## 2017-03-03 DEATH — deceased

## 2018-11-14 IMAGING — DX DG ABD PORTABLE 1V
1 series · 1 of 1 positions shown · non-contrast
Comparison: CT, 10/28/2016

CLINICAL DATA: Abdominal tenderness.  History of a cholecystectomy.

EXAM:
PORTABLE ABDOMEN - 1 VIEW

[abdomen kub]
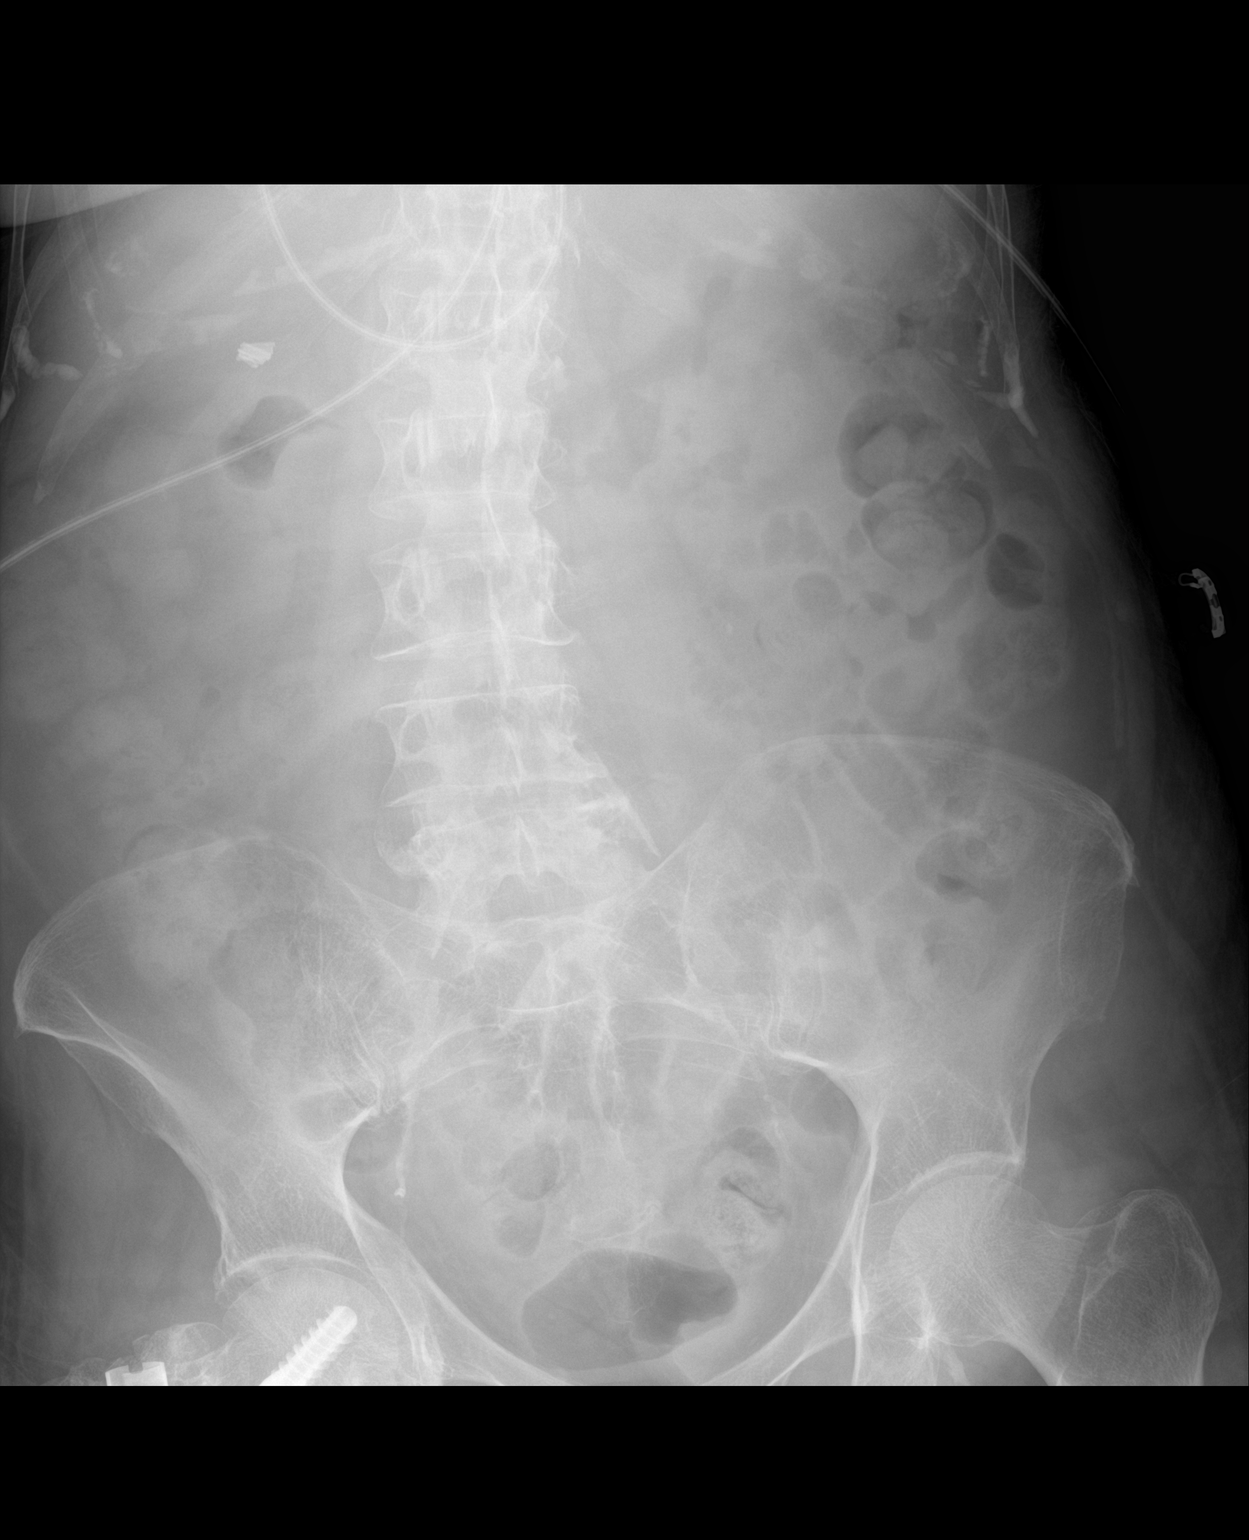

[1 of 1 positions shown; findings below may reference images not displayed]

FINDINGS: There is no bowel dilation to suggest obstruction. Mild increased
stool is noted in the colon.

There has been a prior cholecystectomy. Dense aortic and iliac
vascular calcifications are noted. No evidence of a renal or
ureteral stone. Soft tissues are otherwise unremarkable.

There changes from the ORIF of a proximal right femur fracture. No
acute skeletal abnormality.
IMPRESSION: 1. No acute findings.  No evidence of bowel obstruction.

## 2018-11-17 IMAGING — CT CT HEAD W/O CM
4 series · 15 of 47 positions shown, 17 images · non-contrast
Comparison: Head CT 10/20/2016.

CLINICAL DATA: 82-year-old female with history of acute confusion.
Multiple episodes of atrial fibrillation today.

EXAM:
CT HEAD WITHOUT CONTRAST
TECHNIQUE: Contiguous axial images were obtained from the base of the skull
through the vertex without intravenous contrast.

[Series 3: head without · axial · non-contrast · 0.40mm/px · z∈[+1135,+1245]mm · 6 of 32 slices shown, 8 images]
[im 5/32  brain]
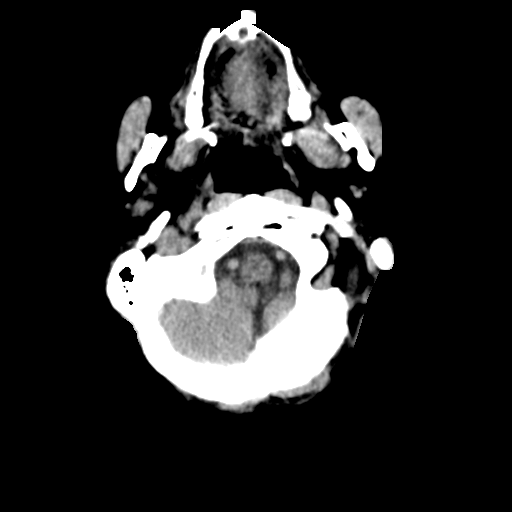
[im 5/32  bone]
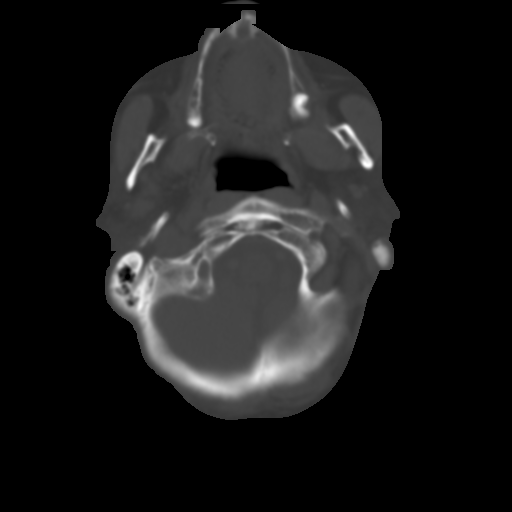
[im 9/32  brain]
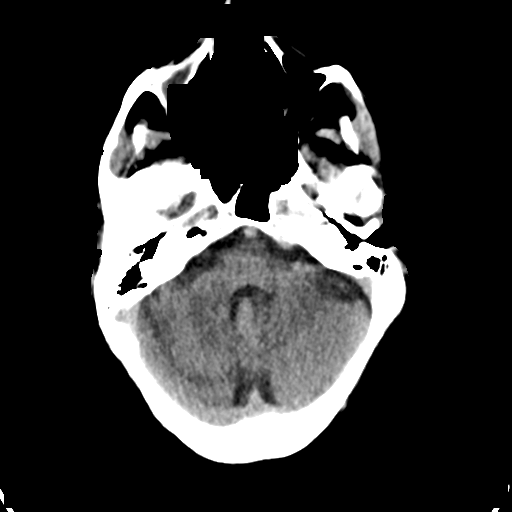
[im 14/32  brain]
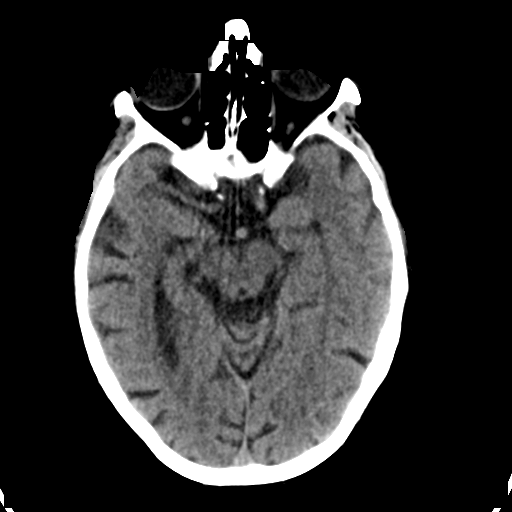
[im 18/32  brain]
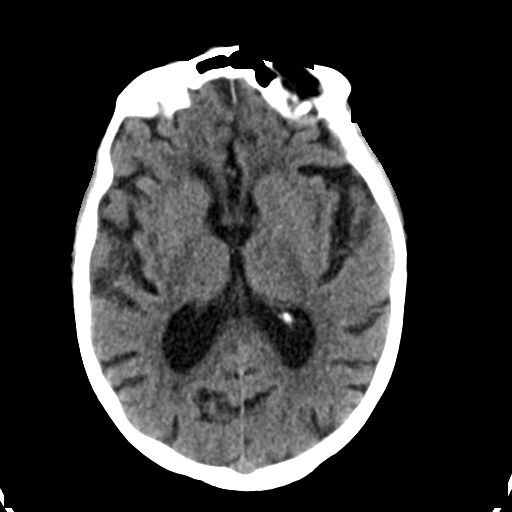
[im 23/32  brain]
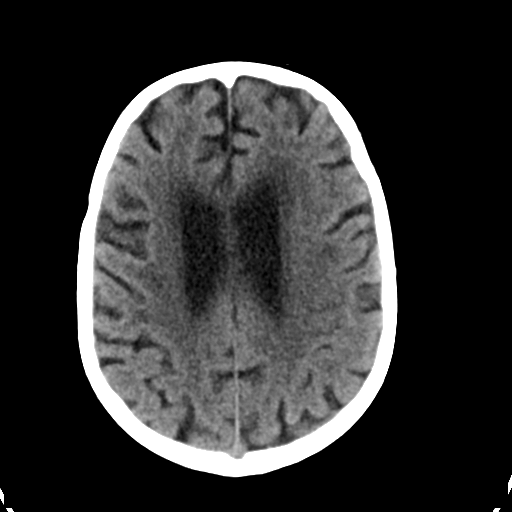
[im 23/32  bone]
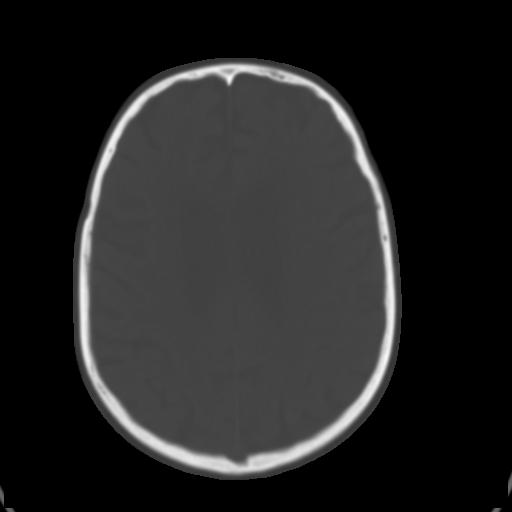
[im 27/32  brain]
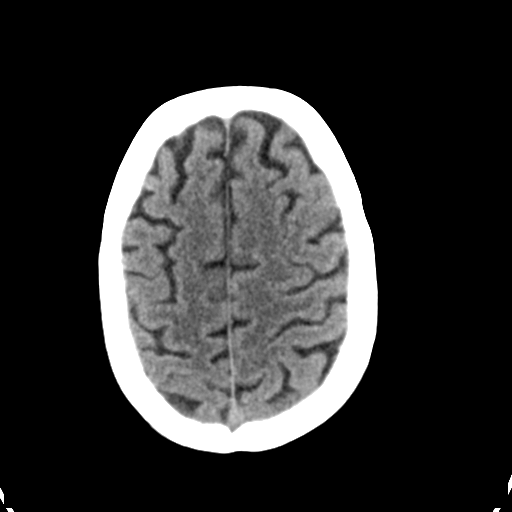

[Series 4: head bone · axial · 0.40mm/px · z∈[+1129,+1169]mm · 3 of 84 slices shown]
[im 8/84  bone]
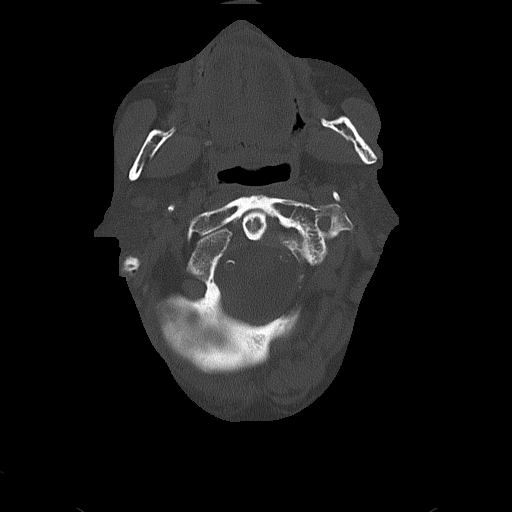
[im 16/84  bone]
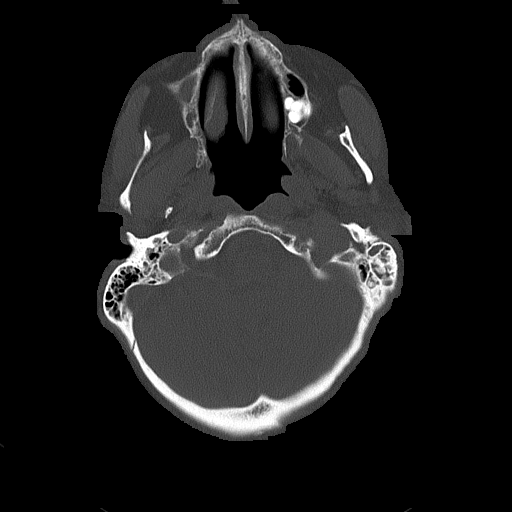
[im 28/84  bone]
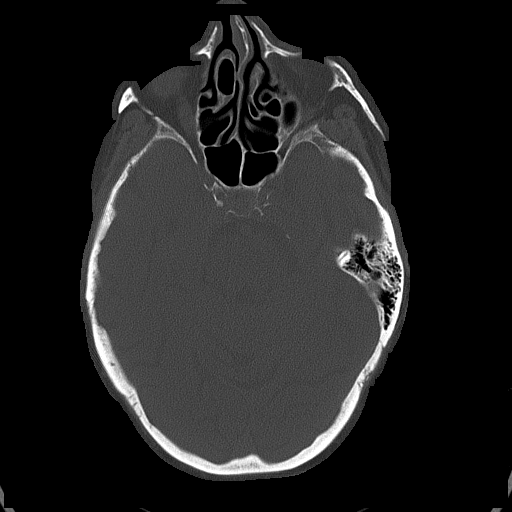

[Series 5: head without cor · coronal · non-contrast · 0.31mm/px · 3 of 65 slices shown]
[im 22/65  brain]
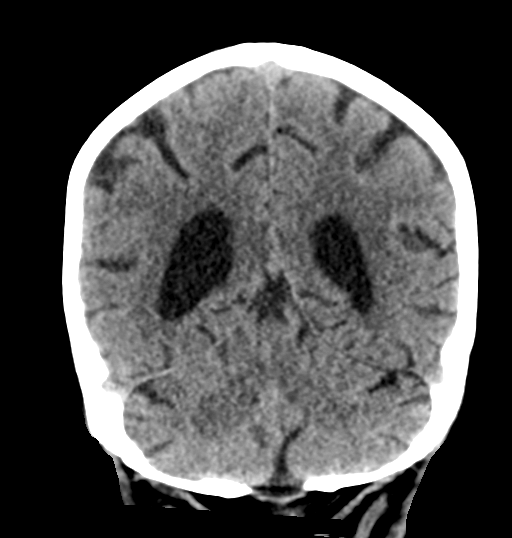
[im 29/65  brain]
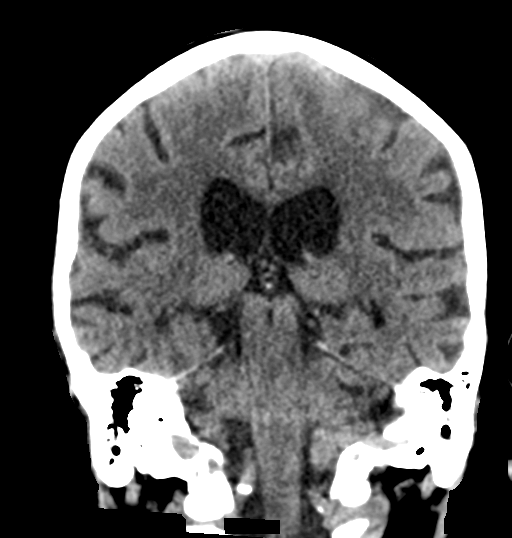
[im 36/65  brain]
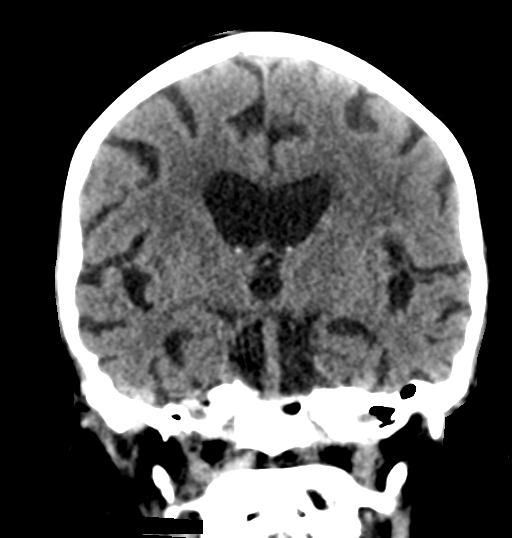

[Series 6: head without sag · sagittal · non-contrast · 0.33mm/px · 3 of 53 slices shown]
[im 18/53  brain]
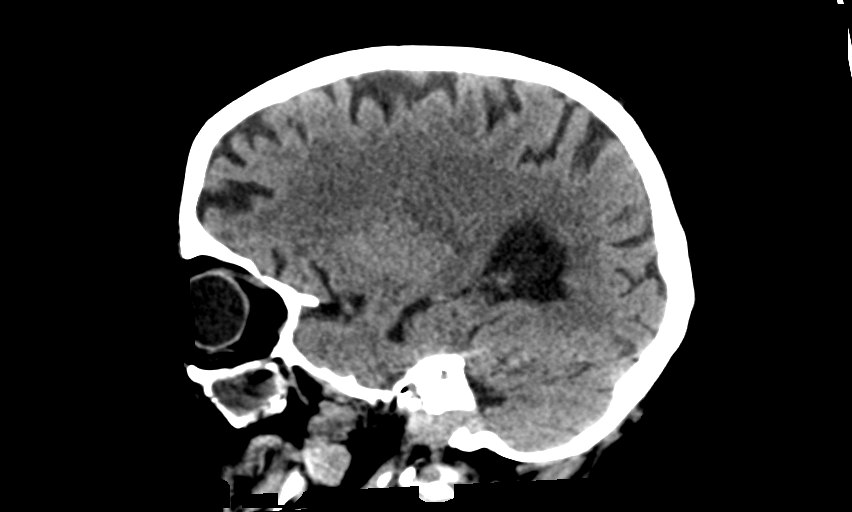
[im 27/53  brain]
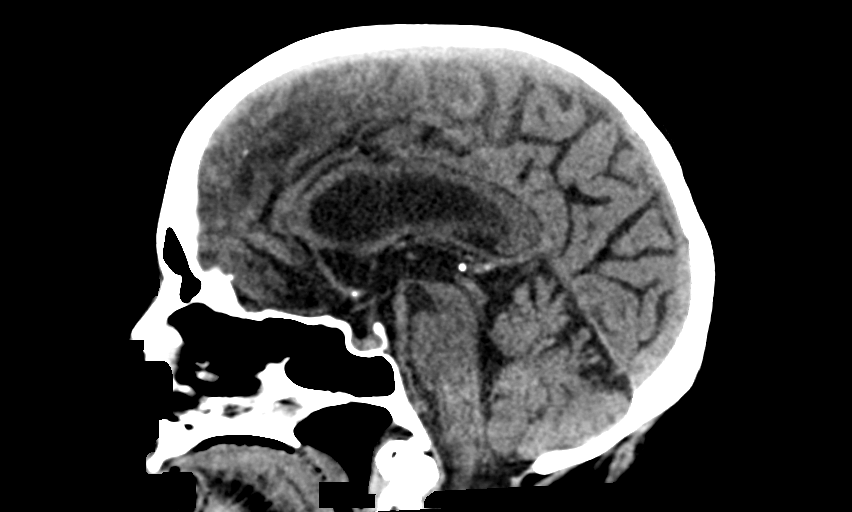
[im 35/53  brain]
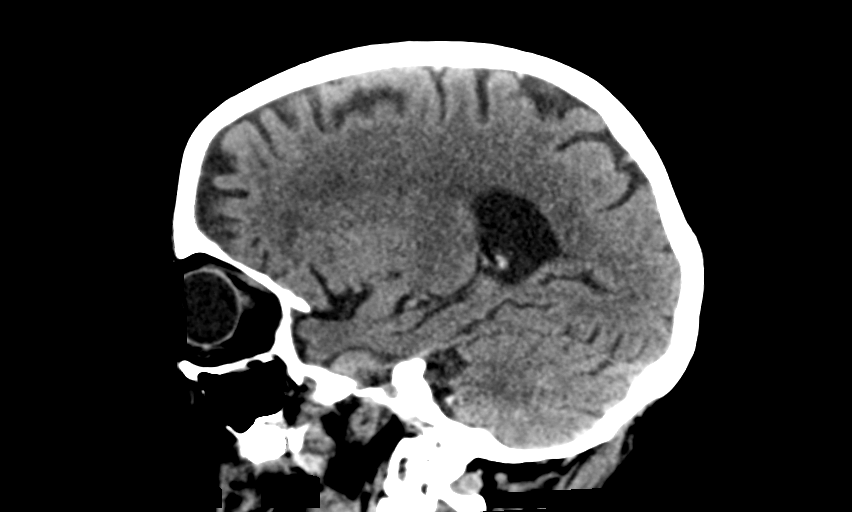

[15 of 47 positions shown; findings below may reference images not displayed]

FINDINGS: Brain: Mild cerebral atrophy. Patchy and confluent areas of
decreased attenuation are noted throughout the deep and
periventricular white matter of the cerebral hemispheres
bilaterally, compatible with chronic microvascular ischemic disease.
Chronic physiologic calcifications in the basal ganglia bilaterally.
No evidence of acute infarction, hemorrhage, hydrocephalus,
extra-axial collection or mass lesion/mass effect.

Vascular: No hyperdense vessel or unexpected calcification.

Skull: Normal. Negative for fracture or focal lesion.

Sinuses/Orbits: Chronic mucoperiosteal thickening, opacification and
involution of the right maxillary sinus, similar to the prior study.
Small left mastoid effusion unchanged.

Other: None.
IMPRESSION: 1. No acute intracranial abnormalities.
2. Mild cerebral atrophy with chronic microvascular ischemic changes
in the cerebral white matter redemonstrated, as above.
3. Small left mastoid effusion is unchanged.
